# Patient Record
Sex: Female | Born: 1940
Health system: Southern US, Community
[De-identification: ages and names within clinical notes are randomized; demographics above are authoritative.]

## PROBLEM LIST (undated history)

## (undated) DIAGNOSIS — K219 Gastro-esophageal reflux disease without esophagitis: Secondary | ICD-10-CM

## (undated) DIAGNOSIS — T7840XA Allergy, unspecified, initial encounter: Secondary | ICD-10-CM

## (undated) DIAGNOSIS — I10 Essential (primary) hypertension: Secondary | ICD-10-CM

## (undated) DIAGNOSIS — E785 Hyperlipidemia, unspecified: Secondary | ICD-10-CM

## (undated) DIAGNOSIS — M1990 Unspecified osteoarthritis, unspecified site: Secondary | ICD-10-CM

## (undated) HISTORY — PX: COLONOSCOPY: SHX174

## (undated) HISTORY — DX: Allergy, unspecified, initial encounter: T78.40XA

## (undated) HISTORY — DX: Gastro-esophageal reflux disease without esophagitis: K21.9

## (undated) HISTORY — DX: Unspecified osteoarthritis, unspecified site: M19.90

## (undated) HISTORY — DX: Essential (primary) hypertension: I10

## (undated) HISTORY — PX: EYE SURGERY: SHX253

## (undated) HISTORY — DX: Hyperlipidemia, unspecified: E78.5

## (undated) HISTORY — PX: ABDOMINAL HYSTERECTOMY: SHX81

---

## 1999-10-05 ENCOUNTER — Other Ambulatory Visit: Admission: RE | Admit: 1999-10-05 | Discharge: 1999-10-05 | Payer: Self-pay | Admitting: Family Medicine

## 2001-06-23 ENCOUNTER — Other Ambulatory Visit: Admission: RE | Admit: 2001-06-23 | Discharge: 2001-06-23 | Payer: Self-pay | Admitting: Family Medicine

## 2005-10-29 ENCOUNTER — Other Ambulatory Visit: Admission: RE | Admit: 2005-10-29 | Discharge: 2005-10-29 | Payer: Self-pay | Admitting: Family Medicine

## 2012-03-30 DIAGNOSIS — Z961 Presence of intraocular lens: Secondary | ICD-10-CM | POA: Insufficient documentation

## 2012-03-30 DIAGNOSIS — I10 Essential (primary) hypertension: Secondary | ICD-10-CM | POA: Insufficient documentation

## 2014-03-14 DIAGNOSIS — I10 Essential (primary) hypertension: Secondary | ICD-10-CM | POA: Diagnosis not present

## 2014-03-14 DIAGNOSIS — Z1331 Encounter for screening for depression: Secondary | ICD-10-CM | POA: Diagnosis not present

## 2014-03-14 DIAGNOSIS — M1710 Unilateral primary osteoarthritis, unspecified knee: Secondary | ICD-10-CM | POA: Diagnosis not present

## 2014-03-14 DIAGNOSIS — E785 Hyperlipidemia, unspecified: Secondary | ICD-10-CM | POA: Diagnosis not present

## 2014-03-14 DIAGNOSIS — M5137 Other intervertebral disc degeneration, lumbosacral region: Secondary | ICD-10-CM | POA: Diagnosis not present

## 2014-03-14 DIAGNOSIS — E559 Vitamin D deficiency, unspecified: Secondary | ICD-10-CM | POA: Diagnosis not present

## 2014-03-14 DIAGNOSIS — K219 Gastro-esophageal reflux disease without esophagitis: Secondary | ICD-10-CM | POA: Diagnosis not present

## 2015-01-04 ENCOUNTER — Telehealth: Payer: Self-pay | Admitting: Family Medicine

## 2015-01-04 NOTE — Telephone Encounter (Signed)
Patient has [place]. She is currently on simvastatin, valsartan, pantaprazole, and cetrizine. Appointment given for 7/5 @ 1:10 with [facility].

## 2015-02-14 ENCOUNTER — Encounter (INDEPENDENT_AMBULATORY_CARE_PROVIDER_SITE_OTHER): Payer: Self-pay

## 2015-02-14 ENCOUNTER — Encounter: Payer: Self-pay | Admitting: Family Medicine

## 2015-02-14 ENCOUNTER — Ambulatory Visit (INDEPENDENT_AMBULATORY_CARE_PROVIDER_SITE_OTHER): Payer: Medicare Other | Admitting: Family Medicine

## 2015-02-14 VITALS — BP 141/77 | HR 79 | Temp 97.7°F | Ht 64.0 in | Wt 176.2 lb

## 2015-02-14 DIAGNOSIS — R0789 Other chest pain: Secondary | ICD-10-CM | POA: Diagnosis not present

## 2015-02-14 DIAGNOSIS — R5383 Other fatigue: Secondary | ICD-10-CM

## 2015-02-14 DIAGNOSIS — R0600 Dyspnea, unspecified: Secondary | ICD-10-CM | POA: Diagnosis not present

## 2015-02-14 DIAGNOSIS — K5900 Constipation, unspecified: Secondary | ICD-10-CM | POA: Diagnosis not present

## 2015-02-14 DIAGNOSIS — E785 Hyperlipidemia, unspecified: Secondary | ICD-10-CM | POA: Insufficient documentation

## 2015-02-14 DIAGNOSIS — I10 Essential (primary) hypertension: Secondary | ICD-10-CM | POA: Insufficient documentation

## 2015-02-14 LAB — POCT CBC
Granulocyte percent: 64.5 %G (ref 37–80)
HCT, POC: 39.5 % (ref 37.7–47.9)
HEMOGLOBIN: 12.3 g/dL (ref 12.2–16.2)
Lymph, poc: 2.5 (ref 0.6–3.4)
MCH, POC: 28.3 pg (ref 27–31.2)
MCHC: 31.2 g/dL — AB (ref 31.8–35.4)
MCV: 90.8 fL (ref 80–97)
MPV: 7.8 fL (ref 0–99.8)
POC Granulocyte: 5.7 (ref 2–6.9)
POC LYMPH %: 28.5 % (ref 10–50)
Platelet Count, POC: 272 10*3/uL (ref 142–424)
RBC: 4.35 M/uL (ref 4.04–5.48)
RDW, POC: 13.4 %
WBC: 8.8 10*3/uL (ref 4.6–10.2)

## 2015-02-14 LAB — PULMONARY FUNCTION TEST

## 2015-02-14 MED ORDER — PANTOPRAZOLE SODIUM 40 MG PO TBEC: 20.0000 mg | DELAYED_RELEASE_TABLET | Freq: Every day | ORAL | Status: DC

## 2015-02-14 NOTE — Progress Notes (Signed)
Subjective:  Patient ID: Wendy Barajas, female    DOB: June 08, 1941  Age: 74 y.o. MRN: [id number]  CC: Establish Care; Hyperlipidemia; Hypertension; Gastrophageal Reflux; and chronic constipation   HPI Lamiyah Viar presents for  follow-up of hypertension. Patient has no history of headache or recent cough. Patient also denies symptoms of TIA such as numbness weakness lateralizing. She has experienced for several months now a fairly consistent substernal pressure. This is nonexertional. It is low-grade. Pain is approximately 3/10. It is associated with some shortness of breath and trouble taking a deep breath. She is not easily winded. She is able to complete her normal activities of daily living in spite of the symptom. Patient does not check  blood pressure at home. Patient denies side effects from her medication. States taking it regularly.  Patient also  in for follow-up of elevated cholesterol. Doing well without complaints on current medication. Denies side effects of statin including myalgia and arthralgia and nausea. Also in today for liver function testing. Currently no chest pain, shortness of breath or other cardiovascular related symptoms noted. Patient in for follow-up of GERD. Currently asymptomatic taking  PPI daily. There is no heartburn. No hematemesis and no melena. No dysphagia or choking. Onset is remote. Progression is stable. Complicating factors, none.  Constipation uses stool softener every night. Ongoing long-term. She had been on a medication for the leg pain she experiences but that medicine was discontinued and the constipation did not get better as she had hoped. Additionally the leg pain has been better without the medicine so she does not want to take that currently. She is not sure the name of the medicine at this time.     History Nasya has a past medical history of Allergy; Hyperlipidemia; Hypertension; and GERD (gastroesophageal reflux disease).   She has  past surgical history that includes Abdominal hysterectomy.   Her family history includes Cancer in her brother and father; Stroke in her brother.She reports that she has never smoked. She does not have any smokeless tobacco history on file. She reports that she does not drink alcohol or use illicit drugs.  Medications are as noted under assessment and plan  ROS Review of Systems  Constitutional: Negative for fever, chills, diaphoresis, appetite change, fatigue and unexpected weight change.  HENT: Negative for congestion, ear pain, hearing loss, postnasal drip, rhinorrhea, sneezing, sore throat and trouble swallowing.   Eyes: Negative for pain.  Respiratory: Negative for cough, chest tightness and shortness of breath.   Cardiovascular: Negative for chest pain and palpitations.  Gastrointestinal: Positive for constipation. Negative for nausea, vomiting, abdominal pain and diarrhea.  Genitourinary: Negative for dysuria, frequency and menstrual problem.  Musculoskeletal: Negative for joint swelling and arthralgias.  Skin: Negative for rash.  Allergic/Immunologic: Positive for environmental allergies. Negative for immunocompromised state.  Neurological: Positive for numbness (left thigh laterally above knee. Painful too. Off med for 2 months.). Negative for dizziness, weakness and headaches.  Psychiatric/Behavioral: Negative for dysphoric mood and agitation.    Objective:  BP 141/77 mmHg  Pulse 79  Temp(Src) 97.7 F (36.5 C) (Oral)  Ht 5\' 4"  (1.626 m)  Wt 176 lb 3.2 oz (79.924 kg)  BMI 30.23 kg/m2  BP Readings from Last 3 Encounters:  02/14/15 141/77    Wt Readings from Last 3 Encounters:  02/14/15 176 lb 3.2 oz (79.924 kg)     Physical Exam  Constitutional: She is oriented to person, place, and time. She appears well-developed and well-nourished. No distress.  HENT:  Head: Normocephalic and atraumatic.  Right Ear: External ear normal.  Left Ear: External ear normal.  Nose:  Nose normal.  Mouth/Throat: Oropharynx is clear and moist.  Eyes: Conjunctivae and EOM are normal. Pupils are equal, round, and reactive to light.  Neck: Normal range of motion. Neck supple. No JVD present. No thyromegaly present.  Carotids free of bruit  Cardiovascular: Normal rate, regular rhythm and normal heart sounds.   No murmur heard. Pulmonary/Chest: Effort normal and breath sounds normal. No stridor. No respiratory distress. She has no wheezes. She has no rales. She exhibits no tenderness.  Abdominal: Soft. Bowel sounds are normal. She exhibits no distension and no mass. There is no tenderness. There is no rebound and no guarding.  Musculoskeletal: Normal range of motion. She exhibits no edema or tenderness.  Lymphadenopathy:    She has no cervical adenopathy.  Neurological: She is alert and oriented to person, place, and time. She has normal reflexes. No cranial nerve deficit. Coordination normal.  Skin: Skin is warm and dry. No rash noted. No erythema. No pallor.  Psychiatric: She has a normal mood and affect. Her behavior is normal. Judgment and thought content normal.    No results found for: HGBA1C  No results found for: WBC, HGB, HCT, PLT, GLUCOSE, CHOL, TRIG, HDL, LDLDIRECT, LDLCALC, ALT, AST, NA, K, CL, CREATININE, BUN, CO2, TSH, PSA, INR, GLUF, HGBA1C, MICROALBUR  No results found.  Assessment & Plan:   Khadeeja was seen today for establish care, hyperlipidemia, hypertension, gastrophageal reflux and chronic constipation.  Diagnoses and all orders for this visit:  Hyperlipidemia Orders: -     CMP14+EGFR -     Lipid panel  Essential hypertension, benign Orders: -     CMP14+EGFR -     Lipid panel  Other fatigue Orders: -     POCT CBC -     Vit D  25 hydroxy (rtn osteoporosis monitoring) -     Thyroid Panel With TSH  Dyspnea Orders: -     PR BREATHING CAPACITY TEST -     ECHO STRESS TEST; Future  Chest pressure Orders: -     ECHO STRESS TEST;  Future  Constipation, unspecified constipation type  Other orders -     pantoprazole (PROTONIX) 40 MG tablet; Take 1 tablet (40 mg total) by mouth daily.   I have changed Ms. Sallas's pantoprazole. I am also having her maintain her simvastatin, valsartan-hydrochlorothiazide, cetirizine, vitamin B-12, and Vitamin D3.  Meds ordered this encounter  Medications  . simvastatin (ZOCOR) 20 MG tablet    Sig: Take 20 mg by mouth daily.   . valsartan-hydrochlorothiazide (DIOVAN-HCT) 160-12.5 MG per tablet    Sig: Take 1 tablet by mouth daily.   Marland Kitchen DISCONTD: pantoprazole (PROTONIX) 40 MG tablet    Sig: Take 20 mg by mouth.  . cetirizine (ZYRTEC) 10 MG tablet    Sig: Take 10 mg by mouth daily.   . vitamin B-12 (CYANOCOBALAMIN) 1000 MCG tablet    Sig: Take 1,000 mcg by mouth daily.   . Cholecalciferol (VITAMIN D3) 400 UNITS CAPS    Sig: Take 1 capsule by mouth daily.   . pantoprazole (PROTONIX) 40 MG tablet    Sig: Take 1 tablet (40 mg total) by mouth daily.    Dispense:  90 tablet    Refill:  3   DASH and Calorie counting handouts given. Constipation handout given as well. I recommended she start Metamucil 2 tablespoons twice a day along with Colace 100  mg twice a day. She can supplement that with GlycoLax. Follow-up: Return in about 6 months (around 08/17/2015) for hypertension.  Claretta Fraise, M.D.

## 2015-02-14 NOTE — Patient Instructions (Addendum)
Calorie Counting for Weight Loss Calories are energy you get from the things you eat and drink. Your body uses this energy to keep you going throughout the day. The number of calories you eat affects your weight. When you eat more calories than your body needs, your body stores the extra calories as fat. When you eat fewer calories than your body needs, your body burns fat to get the energy it needs. Calorie counting means keeping track of how many calories you eat and drink each day. If you make sure to eat fewer calories than your body needs, you should lose weight. In order for calorie counting to work, you will need to eat the number of calories that are right for you in a day to lose a healthy amount of weight per week. A healthy amount of weight to lose per week is usually 1-2 lb (0.5-0.9 kg). A dietitian can determine how many calories you need in a day and give you suggestions on how to reach your calorie goal.  WHAT IS MY MY PLAN? My goal is to have 1200 calories per day.  If I have this many calories per day, I should lose around _____1___ pounds per week. WHAT DO I NEED TO KNOW ABOUT CALORIE COUNTING? In order to meet your daily calorie goal, you will need to:  Find out how many calories are in each food you would like to eat. Try to do this before you eat.  Decide how much of the food you can eat.  Write down what you ate and how many calories it had. Doing this is called keeping a food log. WHERE DO I FIND CALORIE INFORMATION? The number of calories in a food can be found on a Nutrition Facts label. Note that all the information on a label is based on a specific serving of the food. If a food does not have a Nutrition Facts label, try to look up the calories online or ask your dietitian for help. HOW DO I DECIDE HOW MUCH TO EAT? To decide how much of the food you can eat, you will need to consider both the number of calories in one serving and the size of one serving. This information  can be found on the Nutrition Facts label. If a food does not have a Nutrition Facts label, look up the information online or ask your dietitian for help. Remember that calories are listed per serving. If you choose to have more than one serving of a food, you will have to multiply the calories per serving by the amount of servings you plan to eat. For example, the label on a package of bread might say that a serving size is 1 slice and that there are 90 calories in a serving. If you eat 1 slice, you will have eaten 90 calories. If you eat 2 slices, you will have eaten 180 calories. HOW DO I KEEP A FOOD LOG? After each meal, record the following information in your food log:  What you ate.  How much of it you ate.  How many calories it had.  Then, add up your calories. Keep your food log near you, such as in a small notebook in your pocket. Another option is to use a mobile app or website. Some programs will calculate calories for you and show you how many calories you have left each time you add an item to the log. WHAT ARE SOME CALORIE COUNTING TIPS?  Use your calories on foods  and drinks that will fill you up and not leave you hungry. Some examples of this include foods like nuts and nut butters, vegetables, lean proteins, and high-fiber foods (more than 5 g fiber per serving).  Eat nutritious foods and avoid empty calories. Empty calories are calories you get from foods or beverages that do not have many nutrients, such as candy and soda. It is better to have a nutritious high-calorie food (such as an avocado) than a food with few nutrients (such as a bag of chips).  Know how many calories are in the foods you eat most often. This way, you do not have to look up how many calories they have each time you eat them.  Look out for foods that may seem like low-calorie foods but are really high-calorie foods, such as baked goods, soda, and fat-free candy.  Pay attention to calories in drinks.  Drinks such as sodas, specialty coffee drinks, alcohol, and juices have a lot of calories yet do not fill you up. Choose low-calorie drinks like water and diet drinks.  Focus your calorie counting efforts on higher calorie items. Logging the calories in a garden salad that contains only vegetables is less important than calculating the calories in a milk shake.  Find a way of tracking calories that works for you. Get creative. Most people who are successful find ways to keep track of how much they eat in a day, even if they do not count every calorie. WHAT ARE SOME PORTION CONTROL TIPS?  Know how many calories are in a serving. This will help you know how many servings of a certain food you can have.  Use a measuring cup to measure serving sizes. This is helpful when you start out. With time, you will be able to estimate serving sizes for some foods.  Take some time to put servings of different foods on your favorite plates, bowls, and cups so you know what a serving looks like.  Try not to eat straight from a bag or box. Doing this can lead to overeating. Put the amount you would like to eat in a cup or on a plate to make sure you are eating the right portion.  Use smaller plates, glasses, and bowls to prevent overeating. This is a quick and easy way to practice portion control. If your plate is smaller, less food can fit on it.  Try not to multitask while eating, such as watching TV or using your computer. If it is time to eat, sit down at a table and enjoy your food. Doing this will help you to start recognizing when you are full. It will also make you more aware of what and how much you are eating. HOW CAN I CALORIE COUNT WHEN EATING OUT?  Ask for smaller portion sizes or child-sized portions.  Consider sharing an entree and sides instead of getting your own entree.  If you get your own entree, eat only half. Ask for a box at the beginning of your meal and put the rest of your entree in  it so you are not tempted to eat it.  Look for the calories on the menu. If calories are listed, choose the lower calorie options.  Choose dishes that include vegetables, fruits, whole grains, low-fat dairy products, and lean protein. Focusing on smart food choices from each of the 5 food groups can help you stay on track at restaurants.  Choose items that are boiled, broiled, grilled, or steamed.  Choose  water, milk, unsweetened iced tea, or other drinks without added sugars. If you want an alcoholic beverage, choose a lower calorie option. For example, a regular margarita can have up to 700 calories and a glass of wine has around 150.  Stay away from items that are buttered, battered, fried, or served with cream sauce. Items labeled "crispy" are usually fried, unless stated otherwise.  Ask for dressings, sauces, and syrups on the side. These are usually very high in calories, so do not eat much of them.  Watch out for salads. Many people think salads are a healthy option, but this is often not the case. Many salads come with bacon, fried chicken, lots of cheese, fried chips, and dressing. All of these items have a lot of calories. If you want a salad, choose a garden salad and ask for grilled meats or steak. Ask for the dressing on the side, or ask for olive oil and vinegar or lemon to use as dressing.  Estimate how many servings of a food you are given. For example, a serving of cooked rice is  cup or about the size of half a tennis ball or one cupcake wrapper. Knowing serving sizes will help you be aware of how much food you are eating at restaurants. The list below tells you how big or small some common portion sizes are based on everyday objects.  1 oz--4 stacked dice.  3 oz--1 deck of cards.  1 tsp--1 dice.  1 Tbsp-- a Ping-Pong ball.  2 Tbsp--1 Ping-Pong ball.   cup--1 tennis ball or 1 cupcake wrapper.  1 cup--1 baseball. Document Released: 07/29/2005 Document Revised:  12/13/2013 Document Reviewed: 06/03/2013 [facility] Patient Information 2015 [place], [place]. This information is not intended to replace advice given to you by your health care provider. Make sure you discuss any questions you have with your health care provider.    DASH Eating Plan DASH stands for "Dietary Approaches to Stop Hypertension." The DASH eating plan is a healthy eating plan that has been shown to reduce high blood pressure (hypertension). Additional health benefits may include reducing the risk of type 2 diabetes mellitus, heart disease, and stroke. The DASH eating plan may also help with weight loss. WHAT DO I NEED TO KNOW ABOUT THE DASH EATING PLAN? For the DASH eating plan, you will follow these general guidelines:  Choose foods with a percent daily value for sodium of less than 5% (as listed on the food label).  Use salt-free seasonings or herbs instead of table salt or sea salt.  Check with your health care provider or pharmacist before using salt substitutes.  Eat lower-sodium products, often labeled as "lower sodium" or "no salt added."  Eat fresh foods.  Eat more vegetables, fruits, and low-fat dairy products.  Choose whole grains. Look for the word "whole" as the first word in the ingredient list.  Choose fish and skinless chicken or [place] more often than red meat. Limit fish, poultry, and meat to 6 oz (170 g) each day.  Limit sweets, desserts, sugars, and sugary drinks.  Choose heart-healthy fats.  Limit cheese to 1 oz (28 g) per day.  Eat more home-cooked food and less restaurant, buffet, and fast food.  Limit fried foods.  Cook foods using methods other than frying.  Limit canned vegetables. If you do use them, rinse them well to decrease the sodium.  When eating at a restaurant, ask that your food be prepared with less salt, or no salt if possible. WHAT  FOODS CAN I EAT? Seek help from a dietitian for individual calorie needs. Grains Whole grain or  whole wheat bread. Brown rice. Whole grain or whole wheat pasta. Quinoa, bulgur, and whole grain cereals. Low-sodium cereals. Corn or whole wheat flour tortillas. Whole grain cornbread. Whole grain crackers. Low-sodium crackers. Vegetables Fresh or frozen vegetables (raw, steamed, roasted, or grilled). Low-sodium or reduced-sodium tomato and vegetable juices. Low-sodium or reduced-sodium tomato sauce and paste. Low-sodium or reduced-sodium canned vegetables.  Fruits All fresh, canned (in natural juice), or frozen fruits. Meat and Other Protein Products Ground beef (85% or leaner), grass-fed beef, or beef trimmed of fat. Skinless chicken or [place]. Ground chicken or [place]. Pork trimmed of fat. All fish and seafood. Eggs. Dried beans, peas, or lentils. Unsalted nuts and seeds. Unsalted canned beans. Dairy Low-fat dairy products, such as skim or 1% milk, 2% or reduced-fat cheeses, low-fat ricotta or cottage cheese, or plain low-fat yogurt. Low-sodium or reduced-sodium cheeses. Fats and Oils Tub margarines without trans fats. Light or reduced-fat mayonnaise and salad dressings (reduced sodium). Avocado. Safflower, olive, or canola oils. Natural peanut or almond butter. Other Unsalted popcorn and pretzels. The items listed above may not be a complete list of recommended foods or beverages. Contact your dietitian for more options. WHAT FOODS ARE NOT RECOMMENDED? Grains White bread. White pasta. White rice. Refined cornbread. Bagels and croissants. Crackers that contain trans fat. Vegetables Creamed or fried vegetables. Vegetables in a cheese sauce. Regular canned vegetables. Regular canned tomato sauce and paste. Regular tomato and vegetable juices. Fruits Dried fruits. Canned fruit in light or heavy syrup. Fruit juice. Meat and Other Protein Products Fatty cuts of meat. Ribs, chicken wings, bacon, sausage, bologna, salami, chitterlings, fatback, hot dogs, bratwurst, and packaged luncheon meats.  Salted nuts and seeds. Canned beans with salt. Dairy Whole or 2% milk, cream, half-and-half, and cream cheese. Whole-fat or sweetened yogurt. Full-fat cheeses or blue cheese. Nondairy creamers and whipped toppings. Processed cheese, cheese spreads, or cheese curds. Condiments Onion and garlic salt, seasoned salt, table salt, and sea salt. Canned and packaged gravies. Worcestershire sauce. Tartar sauce. Barbecue sauce. Teriyaki sauce. Soy sauce, including reduced sodium. Steak sauce. Fish sauce. Oyster sauce. Cocktail sauce. Horseradish. Ketchup and mustard. Meat flavorings and tenderizers. Bouillon cubes. Hot sauce. Tabasco sauce. Marinades. Taco seasonings. Relishes. Fats and Oils Butter, stick margarine, lard, shortening, ghee, and bacon fat. Coconut, palm kernel, or palm oils. Regular salad dressings. Other Pickles and olives. Salted popcorn and pretzels. The items listed above may not be a complete list of foods and beverages to avoid. Contact your dietitian for more information. WHERE CAN I FIND MORE INFORMATION? National Heart, Lung, and Blood Institute: travelstabloid.com Document Released: 07/18/2011 Document Revised: 12/13/2013 Document Reviewed: 06/02/2013 [facility] Patient Information 2015 [place], [place]. This information is not intended to replace advice given to you by your health care provider. Make sure you discuss any questions you have with your health care provider.    About Constipation  Constipation Overview Constipation is the most common gastrointestinal complaint - about 4 million Americans experience constipation and make 2.5 million physician visits a year to get help for the problem.  Constipation can occur when the colon absorbs too much water, the colon's muscle contraction is slow or sluggish, and/or there is delayed transit time through the colon.  The result is stool that is hard and dry.  Indicators of constipation include straining  during bowel movements greater than 25% of the time, having fewer than three bowel movements  per week, and/or the feeling of incomplete evacuation.  There are established guidelines ([place] II ) for defining constipation. A person needs to have two or more of the following symptoms for at least 12 weeks (not necessarily consecutive) in the preceding 12 months: . Straining in  greater than 25% of bowel movements . Lumpy or hard stools in greater than 25% of bowel movements . Sensation of incomplete emptying in greater than 25% of bowel movements . Sensation of anorectal obstruction/blockade in greater than 25% of bowel movements . Manual maneuvers to help empty greater than 25% of bowel movements (e.g., digital evacuation, support of the pelvic floor)  . Less than  3 bowel movements/week . Loose stools are not present, and criteria for irritable bowel syndrome are insufficient  Common Causes of Constipation . Lack of fiber in your diet . Lack of physical activity . Medications, including iron and calcium supplements  . Dairy intake . Dehydration . Abuse of laxatives  Travel  Irritable Bowel Syndrome  Pregnancy  Luteal phase of menstruation (after ovulation and before menses)  Colorectal problems  Intestinal Dysfunction  Treating Constipation  There are several ways of treating constipation, including changes to diet and exercise, use of laxatives, adjustments to the pelvic floor, and scheduled toileting.  These treatments include: . increasing fiber and fluids in the diet  . increasing physical activity . learning muscle coordination   learning proper toileting techniques and toileting modifications   designing and sticking  to a toileting schedule   *Use Metamucil or similar fiber 2 tablespoons twice daily for maintenance every day. Use Colace or similar stool softener 100 mg twice daily. If constipation persists add over-the-counter GlycoLax as an as needed laxative.  2007,  [employer] Doc.22

## 2015-02-14 NOTE — Progress Notes (Deleted)
   Subjective:  Patient ID: [name], female    DOB: 1940-11-19  Age: 74 y.o. MRN: [id number]  CC: No chief complaint on file.   HPI Desere Tarabocchia presents for ***  History Agustina has no past medical history on file.   She has no past surgical history on file.   Her family history is not on file.She reports that she has never smoked. She does not have any smokeless tobacco history on file. She reports that she does not drink alcohol or use illicit drugs.  No outpatient prescriptions prior to visit.   No facility-administered medications prior to visit.    ROS Review of Systems  Objective:  BP 141/77 mmHg  Pulse 79  Temp(Src) 97.7 F (36.5 C) (Oral)  Ht 5\' 4"  (1.626 m)  Wt 176 lb 3.2 oz (79.924 kg)  BMI 30.23 kg/m2  BP Readings from Last 3 Encounters:  02/14/15 141/77    Wt Readings from Last 3 Encounters:  02/14/15 176 lb 3.2 oz (79.924 kg)     Physical Exam  No results found for: HGBA1C  No results found for: WBC, HGB, HCT, PLT, GLUCOSE, CHOL, TRIG, HDL, LDLDIRECT, LDLCALC, ALT, AST, NA, K, CL, CREATININE, BUN, CO2, TSH, PSA, INR, GLUF, HGBA1C, MICROALBUR  No results found.  Assessment & Plan:   There are no diagnoses linked to this encounter. I am having Ms. Kissoon maintain her simvastatin, valsartan-hydrochlorothiazide, pantoprazole, cetirizine, vitamin B-12, and Vitamin D3.  Meds ordered this encounter  Medications  . simvastatin (ZOCOR) 20 MG tablet    Sig: Take 20 mg by mouth daily.   . valsartan-hydrochlorothiazide (DIOVAN-HCT) 160-12.5 MG per tablet    Sig: Take 1 tablet by mouth daily.   . pantoprazole (PROTONIX) 40 MG tablet    Sig: Take 20 mg by mouth.  . cetirizine (ZYRTEC) 10 MG tablet    Sig: Take 10 mg by mouth daily.   . vitamin B-12 (CYANOCOBALAMIN) 1000 MCG tablet    Sig: Take 1,000 mcg by mouth daily.   . Cholecalciferol (VITAMIN D3) 400 UNITS CAPS    Sig: Take 1 capsule by mouth daily.      Follow-up: No Follow-up on  file.  Claretta Fraise, M.D.

## 2015-02-15 LAB — CMP14+EGFR
ALT: 12 IU/L (ref 0–32)
AST: 16 IU/L (ref 0–40)
Albumin/Globulin Ratio: 1.7 (ref 1.1–2.5)
Albumin: 4.8 g/dL (ref 3.5–4.8)
Alkaline Phosphatase: 89 IU/L (ref 39–117)
BUN/Creatinine Ratio: 17 (ref 11–26)
BUN: 22 mg/dL (ref 8–27)
Bilirubin Total: 1.2 mg/dL (ref 0.0–1.2)
CO2: 26 mmol/L (ref 18–29)
Calcium: 9.9 mg/dL (ref 8.7–10.3)
Chloride: 100 mmol/L (ref 97–108)
Creatinine, Ser: 1.3 mg/dL — ABNORMAL HIGH (ref 0.57–1.00)
GFR calc Af Amer: 47 mL/min/{1.73_m2} — ABNORMAL LOW (ref >59)
GFR calc non Af Amer: 41 mL/min/{1.73_m2} — ABNORMAL LOW (ref >59)
Globulin, Total: 2.8 g/dL (ref 1.5–4.5)
Glucose: 91 mg/dL (ref 65–99)
Potassium: 4.8 mmol/L (ref 3.5–5.2)
SODIUM: 142 mmol/L (ref 134–144)
TOTAL PROTEIN: 7.6 g/dL (ref 6.0–8.5)

## 2015-02-15 LAB — VITAMIN D 25 HYDROXY (VIT D DEFICIENCY, FRACTURES): Vit D, 25-Hydroxy: 36.5 ng/mL (ref 30.0–100.0)

## 2015-02-15 LAB — THYROID PANEL WITH TSH
FREE THYROXINE INDEX: 2 (ref 1.2–4.9)
T3 UPTAKE RATIO: 22 % — AB (ref 24–39)
T4 TOTAL: 9.2 ug/dL (ref 4.5–12.0)
TSH: 1.73 u[IU]/mL (ref 0.450–4.500)

## 2015-02-15 LAB — LIPID PANEL
CHOL/HDL RATIO: 3.7 ratio (ref 0.0–4.4)
Cholesterol, Total: 195 mg/dL (ref 100–199)
HDL: 53 mg/dL (ref >39)
LDL Calculated: 117 mg/dL — ABNORMAL HIGH (ref 0–99)
TRIGLYCERIDES: 123 mg/dL (ref 0–149)
VLDL Cholesterol Cal: 25 mg/dL (ref 5–40)

## 2015-02-24 ENCOUNTER — Encounter: Payer: Self-pay | Admitting: Family Medicine

## 2015-03-07 ENCOUNTER — Telehealth: Payer: Self-pay | Admitting: Family Medicine

## 2015-03-07 ENCOUNTER — Other Ambulatory Visit: Payer: Self-pay | Admitting: *Deleted

## 2015-03-07 MED ORDER — VALSARTAN-HYDROCHLOROTHIAZIDE 160-12.5 MG PO TABS: 1.0000 | ORAL_TABLET | Freq: Every day | ORAL | Status: DC

## 2015-03-07 MED ORDER — SIMVASTATIN 20 MG PO TABS: 20.0000 mg | ORAL_TABLET | Freq: Every day | ORAL | Status: DC

## 2015-03-07 NOTE — Telephone Encounter (Signed)
Patient requesting all medication refills from mail in pharmacy. She had lab work and an office visit on July 05.

## 2015-03-07 NOTE — Telephone Encounter (Signed)
Please do 6 mos of each.

## 2015-03-29 ENCOUNTER — Encounter: Payer: Self-pay | Admitting: *Deleted

## 2015-06-20 ENCOUNTER — Ambulatory Visit (INDEPENDENT_AMBULATORY_CARE_PROVIDER_SITE_OTHER): Payer: Medicare PPO

## 2015-06-20 DIAGNOSIS — Z23 Encounter for immunization: Secondary | ICD-10-CM | POA: Diagnosis not present

## 2015-08-18 ENCOUNTER — Ambulatory Visit (INDEPENDENT_AMBULATORY_CARE_PROVIDER_SITE_OTHER): Payer: Medicare Other | Admitting: Family Medicine

## 2015-08-18 ENCOUNTER — Encounter: Payer: Self-pay | Admitting: Family Medicine

## 2015-08-18 VITALS — Ht 64.0 in | Wt 182.6 lb

## 2015-08-18 DIAGNOSIS — K219 Gastro-esophageal reflux disease without esophagitis: Secondary | ICD-10-CM | POA: Diagnosis not present

## 2015-08-18 DIAGNOSIS — E785 Hyperlipidemia, unspecified: Secondary | ICD-10-CM | POA: Diagnosis not present

## 2015-08-18 DIAGNOSIS — I10 Essential (primary) hypertension: Secondary | ICD-10-CM | POA: Diagnosis not present

## 2015-08-18 NOTE — Progress Notes (Signed)
Subjective:  Patient ID: Wendy Barajas, female    DOB: 1941/06/22  Age: 75 y.o. MRN: [id number]  CC: Hypertension; Hyperlipidemia; and Gastroesophageal Reflux   HPI Wendy Barajas presents for  follow-up of hypertension. Patient has no history of headache chest pain or shortness of breath or recent cough. Patient also denies symptoms of TIA such as numbness weakness lateralizing. Patient checks  blood pressure at home and has not had any elevated readings recently. Patient denies side effects from his medication. States taking it regularly. Patient in for follow-up of elevated cholesterol. Doing well without complaints on current medication. Denies side effects of statin including myalgia and arthralgia and nausea. Also in today for liver function testing. Currently no chest pain, shortness of breath or other cardiovascular related symptoms noted. Patient in for follow-up of GERD. Currently asymptomatic taking  PPI daily. There is no chest pain or heartburn. No hematemesis and no melena. No dysphagia or choking. Onset is remote. Progression is stable. Complicating factors, none.   History Wendy Barajas has a past medical history of Allergy; Hyperlipidemia; Hypertension; and GERD (gastroesophageal reflux disease).   She has past surgical history that includes Abdominal hysterectomy.   Her family history includes Cancer in her brother and father; Stroke in her brother.She reports that she has never smoked. She does not have any smokeless tobacco history on file. She reports that she does not drink alcohol or use illicit drugs.  Outpatient Prescriptions Prior to Visit  Medication Sig Dispense Refill  . cetirizine (ZYRTEC) 10 MG tablet Take 10 mg by mouth daily.     . Cholecalciferol (VITAMIN D3) 400 UNITS CAPS Take 1 capsule by mouth daily.     . pantoprazole (PROTONIX) 40 MG tablet Take 1 tablet (40 mg total) by mouth daily. 90 tablet 3  . simvastatin (ZOCOR) 20 MG tablet Take 1 tablet (20 mg  total) by mouth daily. 90 tablet 3  . valsartan-hydrochlorothiazide (DIOVAN-HCT) 160-12.5 MG per tablet Take 1 tablet by mouth daily. 90 tablet 3  . vitamin B-12 (CYANOCOBALAMIN) 1000 MCG tablet Take 1,000 mcg by mouth daily.      No facility-administered medications prior to visit.    ROS Review of Systems  Constitutional: Negative for fever, activity change and appetite change.  HENT: Negative for congestion, rhinorrhea and sore throat.   Eyes: Negative for visual disturbance.  Respiratory: Negative for cough and shortness of breath.   Cardiovascular: Negative for chest pain and palpitations.  Gastrointestinal: Negative for nausea, abdominal pain and diarrhea.  Genitourinary: Negative for dysuria.  Musculoskeletal: Negative for myalgias and arthralgias.    Objective:  Ht '5\' 4"'$  (1.626 m)  Wt 182 lb 9.6 oz (82.827 kg)  BMI 31.33 kg/m2  BP Readings from Last 3 Encounters:  02/14/15 141/77    Wt Readings from Last 3 Encounters:  08/18/15 182 lb 9.6 oz (82.827 kg)  02/14/15 176 lb 3.2 oz (79.924 kg)     Physical Exam  Constitutional: She is oriented to person, place, and time. She appears well-developed and well-nourished. No distress.  HENT:  Head: Normocephalic and atraumatic.  Right Ear: External ear normal.  Left Ear: External ear normal.  Nose: Nose normal.  Mouth/Throat: Oropharynx is clear and moist.  Eyes: Conjunctivae and EOM are normal. Pupils are equal, round, and reactive to light.  Neck: Normal range of motion. Neck supple. No thyromegaly present.  Cardiovascular: Normal rate, regular rhythm and normal heart sounds.   No murmur heard. Pulmonary/Chest: Effort normal and breath sounds normal. No  respiratory distress. She has no wheezes. She has no rales.  Abdominal: Soft. Bowel sounds are normal. She exhibits no distension. There is no tenderness.  Lymphadenopathy:    She has no cervical adenopathy.  Neurological: She is alert and oriented to person, place,  and time. She has normal reflexes.  Skin: Skin is warm and dry.  Psychiatric: She has a normal mood and affect. Her behavior is normal. Judgment and thought content normal.     Lab Results  Component Value Date   WBC 7.0 08/18/2015   HGB 12.3 02/14/2015   HCT 34.8 08/18/2015   PLT 287 08/18/2015   GLUCOSE 94 08/18/2015   CHOL 181 08/18/2015   TRIG 78 08/18/2015   HDL 54 08/18/2015   LDLCALC 111* 08/18/2015   ALT 14 08/18/2015   AST 21 08/18/2015   NA 139 08/18/2015   K 4.7 08/18/2015   CL 99 08/18/2015   CREATININE 1.46* 08/18/2015   BUN 31* 08/18/2015   CO2 22 08/18/2015   TSH 1.730 02/14/2015    No results found.  Assessment & Plan:   Wendy Barajas was seen today for hypertension, hyperlipidemia and gastroesophageal reflux.  Diagnoses and all orders for this visit:  Essential hypertension, benign -     CBC with Differential/Platelet -     CMP14+EGFR  Hyperlipidemia -     CMP14+EGFR -     Lipid panel  Gastroesophageal reflux disease without esophagitis -     CBC with Differential/Platelet   I have discontinued Wendy Barajas's vitamin B-12. I am also having her maintain her cetirizine, Vitamin D3, pantoprazole, simvastatin, and valsartan-hydrochlorothiazide.  No orders of the defined types were placed in this encounter.     Follow-up: Return in about 6 months (around 02/15/2016) for CPE.  Wendy Barajas, M.D.

## 2015-08-19 LAB — CMP14+EGFR
A/G RATIO: 1.6 (ref 1.1–2.5)
ALBUMIN: 4.5 g/dL (ref 3.5–4.8)
ALT: 14 IU/L (ref 0–32)
AST: 21 IU/L (ref 0–40)
Alkaline Phosphatase: 96 IU/L (ref 39–117)
BUN / CREAT RATIO: 21 (ref 11–26)
BUN: 31 mg/dL — ABNORMAL HIGH (ref 8–27)
Bilirubin Total: 0.9 mg/dL (ref 0.0–1.2)
CALCIUM: 9.6 mg/dL (ref 8.7–10.3)
CO2: 22 mmol/L (ref 18–29)
Chloride: 99 mmol/L (ref 96–106)
Creatinine, Ser: 1.46 mg/dL — ABNORMAL HIGH (ref 0.57–1.00)
GFR, EST AFRICAN AMERICAN: 40 mL/min/{1.73_m2} — AB (ref >59)
GFR, EST NON AFRICAN AMERICAN: 35 mL/min/{1.73_m2} — AB (ref >59)
Globulin, Total: 2.9 g/dL (ref 1.5–4.5)
Glucose: 94 mg/dL (ref 65–99)
POTASSIUM: 4.7 mmol/L (ref 3.5–5.2)
Sodium: 139 mmol/L (ref 134–144)
TOTAL PROTEIN: 7.4 g/dL (ref 6.0–8.5)

## 2015-08-19 LAB — CBC WITH DIFFERENTIAL/PLATELET
BASOS: 0 %
Basophils Absolute: 0 10*3/uL (ref 0.0–0.2)
EOS (ABSOLUTE): 0.3 10*3/uL (ref 0.0–0.4)
EOS: 4 %
HEMATOCRIT: 34.8 % (ref 34.0–46.6)
HEMOGLOBIN: 11.9 g/dL (ref 11.1–15.9)
IMMATURE GRANULOCYTES: 0 %
Immature Grans (Abs): 0 10*3/uL (ref 0.0–0.1)
Lymphocytes Absolute: 2.4 10*3/uL (ref 0.7–3.1)
Lymphs: 34 %
MCH: 30.6 pg (ref 26.6–33.0)
MCHC: 34.2 g/dL (ref 31.5–35.7)
MCV: 90 fL (ref 79–97)
Monocytes Absolute: 0.5 10*3/uL (ref 0.1–0.9)
Monocytes: 7 %
NEUTROS ABS: 3.8 10*3/uL (ref 1.4–7.0)
NEUTROS PCT: 55 %
Platelets: 287 10*3/uL (ref 150–379)
RBC: 3.89 x10E6/uL (ref 3.77–5.28)
RDW: 13.6 % (ref 12.3–15.4)
WBC: 7 10*3/uL (ref 3.4–10.8)

## 2015-08-19 LAB — LIPID PANEL
CHOLESTEROL TOTAL: 181 mg/dL (ref 100–199)
Chol/HDL Ratio: 3.4 ratio units (ref 0.0–4.4)
HDL: 54 mg/dL (ref >39)
LDL Calculated: 111 mg/dL — ABNORMAL HIGH (ref 0–99)
Triglycerides: 78 mg/dL (ref 0–149)
VLDL CHOLESTEROL CAL: 16 mg/dL (ref 5–40)

## 2015-12-13 ENCOUNTER — Other Ambulatory Visit: Payer: Self-pay | Admitting: Family Medicine

## 2015-12-13 MED ORDER — VALSARTAN-HYDROCHLOROTHIAZIDE 160-12.5 MG PO TABS: 1.0000 | ORAL_TABLET | Freq: Every day | ORAL | Status: DC

## 2015-12-13 NOTE — Telephone Encounter (Signed)
Patient aware that rx was sent to pharmacy.

## 2015-12-14 ENCOUNTER — Other Ambulatory Visit: Payer: Self-pay | Admitting: Family Medicine

## 2016-02-08 DIAGNOSIS — J309 Allergic rhinitis, unspecified: Secondary | ICD-10-CM | POA: Diagnosis not present

## 2016-02-08 DIAGNOSIS — I10 Essential (primary) hypertension: Secondary | ICD-10-CM | POA: Diagnosis not present

## 2016-02-08 DIAGNOSIS — E785 Hyperlipidemia, unspecified: Secondary | ICD-10-CM | POA: Diagnosis not present

## 2016-02-08 DIAGNOSIS — K5900 Constipation, unspecified: Secondary | ICD-10-CM | POA: Diagnosis not present

## 2016-02-09 DIAGNOSIS — Z0000 Encounter for general adult medical examination without abnormal findings: Secondary | ICD-10-CM | POA: Diagnosis not present

## 2016-02-09 DIAGNOSIS — E785 Hyperlipidemia, unspecified: Secondary | ICD-10-CM | POA: Diagnosis not present

## 2016-02-09 DIAGNOSIS — I10 Essential (primary) hypertension: Secondary | ICD-10-CM | POA: Diagnosis not present

## 2016-04-16 ENCOUNTER — Other Ambulatory Visit: Payer: Self-pay | Admitting: Family Medicine

## 2016-04-16 NOTE — Telephone Encounter (Signed)
Authorize 30 days only. Then contact the patient letting them know that they will need an appointment before any further prescriptions can be sent in. 

## 2016-06-14 ENCOUNTER — Ambulatory Visit: Payer: Medicare Other | Admitting: Family Medicine

## 2016-06-19 ENCOUNTER — Other Ambulatory Visit: Payer: Self-pay | Admitting: Family Medicine

## 2016-06-25 ENCOUNTER — Ambulatory Visit: Payer: Medicare Other | Admitting: Physician Assistant

## 2016-08-07 ENCOUNTER — Other Ambulatory Visit: Payer: Medicare Other

## 2016-08-07 DIAGNOSIS — E785 Hyperlipidemia, unspecified: Secondary | ICD-10-CM

## 2016-08-07 DIAGNOSIS — I10 Essential (primary) hypertension: Secondary | ICD-10-CM

## 2016-08-08 ENCOUNTER — Ambulatory Visit (INDEPENDENT_AMBULATORY_CARE_PROVIDER_SITE_OTHER): Payer: Medicare Other | Admitting: Physician Assistant

## 2016-08-08 ENCOUNTER — Encounter (INDEPENDENT_AMBULATORY_CARE_PROVIDER_SITE_OTHER): Payer: Self-pay

## 2016-08-08 ENCOUNTER — Encounter: Payer: Self-pay | Admitting: Physician Assistant

## 2016-08-08 VITALS — BP 135/80 | HR 79 | Temp 97.0°F | Ht 64.0 in | Wt 179.0 lb

## 2016-08-08 DIAGNOSIS — K219 Gastro-esophageal reflux disease without esophagitis: Secondary | ICD-10-CM

## 2016-08-08 DIAGNOSIS — E782 Mixed hyperlipidemia: Secondary | ICD-10-CM | POA: Diagnosis not present

## 2016-08-08 DIAGNOSIS — I10 Essential (primary) hypertension: Secondary | ICD-10-CM

## 2016-08-08 DIAGNOSIS — J0100 Acute maxillary sinusitis, unspecified: Secondary | ICD-10-CM | POA: Diagnosis not present

## 2016-08-08 DIAGNOSIS — Z0000 Encounter for general adult medical examination without abnormal findings: Secondary | ICD-10-CM

## 2016-08-08 LAB — LIPID PANEL
CHOL/HDL RATIO: 3.2 ratio (ref 0.0–4.4)
Cholesterol, Total: 139 mg/dL (ref 100–199)
HDL: 43 mg/dL (ref >39)
LDL Calculated: 76 mg/dL (ref 0–99)
Triglycerides: 98 mg/dL (ref 0–149)
VLDL Cholesterol Cal: 20 mg/dL (ref 5–40)

## 2016-08-08 LAB — CBC WITH DIFFERENTIAL/PLATELET
BASOS: 0 %
Basophils Absolute: 0 10*3/uL (ref 0.0–0.2)
EOS (ABSOLUTE): 0.1 10*3/uL (ref 0.0–0.4)
EOS: 1 %
HEMATOCRIT: 34.3 % (ref 34.0–46.6)
Hemoglobin: 11.4 g/dL (ref 11.1–15.9)
IMMATURE GRANULOCYTES: 0 %
Immature Grans (Abs): 0 10*3/uL (ref 0.0–0.1)
LYMPHS ABS: 2 10*3/uL (ref 0.7–3.1)
Lymphs: 28 %
MCH: 30.4 pg (ref 26.6–33.0)
MCHC: 33.2 g/dL (ref 31.5–35.7)
MCV: 92 fL (ref 79–97)
MONOS ABS: 0.7 10*3/uL (ref 0.1–0.9)
Monocytes: 10 %
NEUTROS ABS: 4.3 10*3/uL (ref 1.4–7.0)
Neutrophils: 61 %
Platelets: 224 10*3/uL (ref 150–379)
RBC: 3.75 x10E6/uL — ABNORMAL LOW (ref 3.77–5.28)
RDW: 14.5 % (ref 12.3–15.4)
WBC: 7 10*3/uL (ref 3.4–10.8)

## 2016-08-08 LAB — CMP14+EGFR
ALT: 7 IU/L (ref 0–32)
AST: 12 IU/L (ref 0–40)
Albumin/Globulin Ratio: 1.3 (ref 1.2–2.2)
Albumin: 4.1 g/dL (ref 3.5–4.8)
Alkaline Phosphatase: 69 IU/L (ref 39–117)
BILIRUBIN TOTAL: 0.6 mg/dL (ref 0.0–1.2)
BUN/Creatinine Ratio: 12 (ref 12–28)
BUN: 18 mg/dL (ref 8–27)
CHLORIDE: 105 mmol/L (ref 96–106)
CO2: 22 mmol/L (ref 18–29)
Calcium: 9.1 mg/dL (ref 8.7–10.3)
Creatinine, Ser: 1.52 mg/dL — ABNORMAL HIGH (ref 0.57–1.00)
GFR calc non Af Amer: 33 mL/min/{1.73_m2} — ABNORMAL LOW (ref >59)
GFR, EST AFRICAN AMERICAN: 38 mL/min/{1.73_m2} — AB (ref >59)
GLUCOSE: 110 mg/dL — AB (ref 65–99)
Globulin, Total: 3.1 g/dL (ref 1.5–4.5)
Potassium: 4.5 mmol/L (ref 3.5–5.2)
Sodium: 144 mmol/L (ref 134–144)
TOTAL PROTEIN: 7.2 g/dL (ref 6.0–8.5)

## 2016-08-08 MED ORDER — SIMVASTATIN 20 MG PO TABS: 20.0000 mg | ORAL_TABLET | Freq: Every day | ORAL | 3 refills | Status: DC

## 2016-08-08 MED ORDER — PANTOPRAZOLE SODIUM 40 MG PO TBEC: 40.0000 mg | DELAYED_RELEASE_TABLET | Freq: Every day | ORAL | 3 refills | Status: DC

## 2016-08-08 MED ORDER — CETIRIZINE HCL 10 MG PO TABS: 10.0000 mg | ORAL_TABLET | Freq: Every day | ORAL | 3 refills | Status: DC

## 2016-08-08 MED ORDER — VALSARTAN-HYDROCHLOROTHIAZIDE 160-12.5 MG PO TABS: 1.0000 | ORAL_TABLET | Freq: Every day | ORAL | 3 refills | Status: DC

## 2016-08-08 MED ORDER — AMOXICILLIN 500 MG PO CAPS: 1000.0000 mg | ORAL_CAPSULE | Freq: Two times a day (BID) | ORAL | 0 refills | Status: DC

## 2016-08-08 NOTE — Patient Instructions (Signed)

## 2016-08-11 ENCOUNTER — Encounter: Payer: Self-pay | Admitting: Physician Assistant

## 2016-08-11 DIAGNOSIS — J0100 Acute maxillary sinusitis, unspecified: Secondary | ICD-10-CM | POA: Insufficient documentation

## 2016-08-11 DIAGNOSIS — Z0000 Encounter for general adult medical examination without abnormal findings: Secondary | ICD-10-CM | POA: Insufficient documentation

## 2016-08-11 DIAGNOSIS — K219 Gastro-esophageal reflux disease without esophagitis: Secondary | ICD-10-CM | POA: Insufficient documentation

## 2016-08-11 NOTE — Progress Notes (Signed)
BP 135/80   Pulse 79   Temp 97 F (36.1 C) (Oral)   Ht '5\' 4"'$  (1.626 m)   Wt 179 lb (81.2 kg)   BMI 30.73 kg/m    Subjective:    Patient ID: Wendy Barajas, female    DOB: 20-Aug-1940, 75 y.o.   MRN: [id number]  Wendy Barajas is a 75 y.o. female presenting on 08/08/2016 for Nasal Congestion (pt here today c/o sinus pressure, headache, runny nose for the past 7 days.)   HPI  Past Medical History:  Diagnosis Date  . Allergy   . GERD (gastroesophageal reflux disease)   . Hyperlipidemia   . Hypertension    Relevant past medical, surgical, family and social history reviewed and updated as indicated. Interim medical history since our last visit reviewed. Allergies and medications reviewed and updated.   Data reviewed from any sources in EPIC.  Review of Systems  Constitutional: Negative.  Negative for activity change, appetite change, fatigue and fever.  HENT: Positive for congestion, sinus pain, sinus pressure and sore throat.   Eyes: Negative.   Respiratory: Positive for cough. Negative for wheezing.   Cardiovascular: Negative.  Negative for chest pain, palpitations and leg swelling.  Gastrointestinal: Negative.  Negative for abdominal pain.  Endocrine: Negative.   Genitourinary: Negative.  Negative for dysuria.  Musculoskeletal: Negative.   Skin: Negative.   Neurological: Negative.      Social History   Social History  . Marital status: Widowed    Spouse name: N/A  . Number of children: N/A  . Years of education: N/A   Occupational History  . Not on file.   Social History Main Topics  . Smoking status: Never Smoker  . Smokeless tobacco: Never Used  . Alcohol use No  . Drug use: No  . Sexual activity: Not on file   Other Topics Concern  . Not on file   Social History Narrative  . No narrative on file    Past Surgical History:  Procedure Laterality Date  . ABDOMINAL HYSTERECTOMY      Family History  Problem Relation Age of Onset  . Cancer Father    . Cancer Brother   . Stroke Brother     Allergies as of 08/08/2016   No Known Allergies     Medication List       Accurate as of 08/08/16 11:59 PM. Always use your most recent med list.          amoxicillin 500 MG capsule Commonly known as:  AMOXIL Take 2 capsules (1,000 mg total) by mouth 2 (two) times daily.   cetirizine 10 MG tablet Commonly known as:  ZYRTEC Take 1 tablet (10 mg total) by mouth daily.   pantoprazole 40 MG tablet Commonly known as:  PROTONIX Take 1 tablet (40 mg total) by mouth daily.   simvastatin 20 MG tablet Commonly known as:  ZOCOR Take 1 tablet (20 mg total) by mouth daily.   valsartan-hydrochlorothiazide 160-12.5 MG tablet Commonly known as:  DIOVAN-HCT Take 1 tablet by mouth daily.   Vitamin D3 400 units Caps Take 1 capsule by mouth daily.          Objective:    BP 135/80   Pulse 79   Temp 97 F (36.1 C) (Oral)   Ht '5\' 4"'$  (1.626 m)   Wt 179 lb (81.2 kg)   BMI 30.73 kg/m   No Known Allergies Wt Readings from Last 3 Encounters:  08/08/16 179 lb (81.2 kg)  08/18/15 182 lb 9.6 oz (82.8 kg)  02/14/15 176 lb 3.2 oz (79.9 kg)    Physical Exam  Constitutional: She is oriented to person, place, and time. She appears well-developed and well-nourished.  HENT:  Head: Normocephalic and atraumatic.  Right Ear: Tympanic membrane, external ear and ear canal normal. No middle ear effusion.  Left Ear: Tympanic membrane, external ear and ear canal normal.  No middle ear effusion.  Nose: Mucosal edema and rhinorrhea present. Right sinus exhibits no maxillary sinus tenderness. Left sinus exhibits no maxillary sinus tenderness.  Mouth/Throat: Uvula is midline and mucous membranes are normal. Posterior oropharyngeal erythema present. No oropharyngeal exudate.  Eyes: Conjunctivae and EOM are normal. Pupils are equal, round, and reactive to light. Right eye exhibits no discharge. Left eye exhibits no discharge.  Neck: Normal range of motion.  Neck supple.  Cardiovascular: Normal rate, regular rhythm, normal heart sounds and intact distal pulses.   Pulmonary/Chest: Effort normal and breath sounds normal. No respiratory distress. She has no wheezes.  Abdominal: Soft. Bowel sounds are normal.  Lymphadenopathy:    She has no cervical adenopathy.  Neurological: She is alert and oriented to person, place, and time. She has normal reflexes.  Skin: Skin is warm and dry. No rash noted.  Psychiatric: She has a normal mood and affect. Her behavior is normal. Judgment and thought content normal.    Results for orders placed or performed in visit on 08/07/16  CBC with Differential/Platelet  Result Value Ref Range   WBC 7.0 3.4 - 10.8 x10E3/uL   RBC 3.75 (L) 3.77 - 5.28 x10E6/uL   Hemoglobin 11.4 11.1 - 15.9 g/dL   Hematocrit 34.3 34.0 - 46.6 %   MCV 92 79 - 97 fL   MCH 30.4 26.6 - 33.0 pg   MCHC 33.2 31.5 - 35.7 g/dL   RDW 14.5 12.3 - 15.4 %   Platelets 224 150 - 379 x10E3/uL   Neutrophils 61 Not Estab. %   Lymphs 28 Not Estab. %   Monocytes 10 Not Estab. %   Eos 1 Not Estab. %   Basos 0 Not Estab. %   Neutrophils Absolute 4.3 1.4 - 7.0 x10E3/uL   Lymphocytes Absolute 2.0 0.7 - 3.1 x10E3/uL   Monocytes Absolute 0.7 0.1 - 0.9 x10E3/uL   EOS (ABSOLUTE) 0.1 0.0 - 0.4 x10E3/uL   Basophils Absolute 0.0 0.0 - 0.2 x10E3/uL   Immature Granulocytes 0 Not Estab. %   Immature Grans (Abs) 0.0 0.0 - 0.1 x10E3/uL  Lipid panel  Result Value Ref Range   Cholesterol, Total 139 100 - 199 mg/dL   Triglycerides 98 0 - 149 mg/dL   HDL 43 >39 mg/dL   VLDL Cholesterol Cal 20 5 - 40 mg/dL   LDL Calculated 76 0 - 99 mg/dL   Chol/HDL Ratio 3.2 0.0 - 4.4 ratio units  CMP14+EGFR  Result Value Ref Range   Glucose 110 (H) 65 - 99 mg/dL   BUN 18 8 - 27 mg/dL   Creatinine, Ser 1.52 (H) 0.57 - 1.00 mg/dL   GFR calc non Af Amer 33 (L) >59 mL/min/1.73   GFR calc Af Amer 38 (L) >59 mL/min/1.73   BUN/Creatinine Ratio 12 12 - 28   Sodium 144 134 - 144  mmol/L   Potassium 4.5 3.5 - 5.2 mmol/L   Chloride 105 96 - 106 mmol/L   CO2 22 18 - 29 mmol/L   Calcium 9.1 8.7 - 10.3 mg/dL   Total Protein 7.2 6.0 - 8.5  g/dL   Albumin 4.1 3.5 - 4.8 g/dL   Globulin, Total 3.1 1.5 - 4.5 g/dL   Albumin/Globulin Ratio 1.3 1.2 - 2.2   Bilirubin Total 0.6 0.0 - 1.2 mg/dL   Alkaline Phosphatase 69 39 - 117 IU/L   AST 12 0 - 40 IU/L   ALT 7 0 - 32 IU/L      Assessment & Plan:   1. Well adult exam - cetirizine (ZYRTEC) 10 MG tablet; Take 1 tablet (10 mg total) by mouth daily.  Dispense: 90 tablet; Refill: 3  2. Acute non-recurrent maxillary sinusitis - amoxicillin (AMOXIL) 500 MG capsule; Take 2 capsules (1,000 mg total) by mouth 2 (two) times daily.  Dispense: 40 capsule; Refill: 0  3. Essential hypertension - valsartan-hydrochlorothiazide (DIOVAN-HCT) 160-12.5 MG tablet; Take 1 tablet by mouth daily.  Dispense: 90 tablet; Refill: 3  4. Gastroesophageal reflux disease without esophagitis - pantoprazole (PROTONIX) 40 MG tablet; Take 1 tablet (40 mg total) by mouth daily.  Dispense: 90 tablet; Refill: 3  5. Mixed hyperlipidemia - simvastatin (ZOCOR) 20 MG tablet; Take 1 tablet (20 mg total) by mouth daily.  Dispense: 90 tablet; Refill: 3   Continue all other maintenance medications as listed above. Educational handout given for health maintenance  Follow up plan: Return in about 6 months (around 02/06/2017) for recheck lab and BP.  Terald Sleeper PA-C [facility] [address]  [place], [place] [address] [phone]   08/11/2016, 6:43 PM

## 2017-02-07 ENCOUNTER — Encounter: Payer: Self-pay | Admitting: Physician Assistant

## 2017-02-07 ENCOUNTER — Ambulatory Visit (INDEPENDENT_AMBULATORY_CARE_PROVIDER_SITE_OTHER): Payer: Medicare HMO | Admitting: Physician Assistant

## 2017-02-07 VITALS — BP 120/76 | HR 84 | Temp 97.1°F | Ht 64.0 in | Wt 179.4 lb

## 2017-02-07 DIAGNOSIS — R7309 Other abnormal glucose: Secondary | ICD-10-CM

## 2017-02-07 DIAGNOSIS — K5904 Chronic idiopathic constipation: Secondary | ICD-10-CM | POA: Diagnosis not present

## 2017-02-07 DIAGNOSIS — I10 Essential (primary) hypertension: Secondary | ICD-10-CM

## 2017-02-07 DIAGNOSIS — E785 Hyperlipidemia, unspecified: Secondary | ICD-10-CM

## 2017-02-07 DIAGNOSIS — Z1211 Encounter for screening for malignant neoplasm of colon: Secondary | ICD-10-CM

## 2017-02-07 LAB — BAYER DCA HB A1C WAIVED: HB A1C (BAYER DCA - WAIVED): 6 % (ref <7.0)

## 2017-02-07 MED ORDER — MONTELUKAST SODIUM 10 MG PO TABS: 10.0000 mg | ORAL_TABLET | Freq: Every day | ORAL | 3 refills | Status: DC

## 2017-02-07 NOTE — Progress Notes (Signed)
BP 120/76   Pulse 84   Temp 97.1 F (36.2 C) (Oral)   Ht _0  (1.626 m)   Wt 179 lb 6.4 oz (81.4 kg)   BMI 30.79 kg/m    Subjective:    Patient ID: Wendy Barajas, female    DOB: [birth year], 76 y.o.   MRN: [id number]  HPI: Wendy Barajas is a 76 y.o. female presenting on 02/07/2017 for Follow-up  This patient comes in for periodic recheck on medications and conditions including increased hypertension, hyperlipidemia, chronic constipation. She states she does need a screening colonoscopy. Is been some years since she has had one. She takes a over-the-counter stool softener with good effect. She takes it daily. She does not take it she gets act up again. She's not having any other issues except allergies are slightly worse. She has taken Claritin and Zyrtec in the past. She is currently taking Zyrtec. She would like to try singulair.   All medications are reviewed today. There are no reports of any problems with the medications. All of the medical conditions are reviewed and updated.  Lab work is reviewed and will be ordered as medically necessary. There are no new problems reported with today's visit.  Relevant past medical, surgical, family and social history reviewed and updated as indicated. Allergies and medications reviewed and updated.  Past Medical History:  Diagnosis Date  . Allergy   . GERD (gastroesophageal reflux disease)   . Hyperlipidemia   . Hypertension     Past Surgical History:  Procedure Laterality Date  . ABDOMINAL HYSTERECTOMY      Review of Systems  Constitutional: Negative.  Negative for activity change, fatigue and fever.  HENT: Negative.   Eyes: Negative.   Respiratory: Negative.  Negative for cough.   Cardiovascular: Negative.  Negative for chest pain.  Gastrointestinal: Negative.  Negative for abdominal pain.  Endocrine: Negative.   Genitourinary: Negative.  Negative for dysuria.  Musculoskeletal: Negative.   Skin: Negative.   Neurological:  Negative.     Allergies as of 02/07/2017   No Known Allergies     Medication List       Accurate as of 02/07/17  9:59 AM. Always use your most recent med list.          cetirizine 10 MG tablet Commonly known as:  ZYRTEC Take 1 tablet (10 mg total) by mouth daily.   montelukast 10 MG tablet Commonly known as:  SINGULAIR Take 1 tablet (10 mg total) by mouth at bedtime.   pantoprazole 40 MG tablet Commonly known as:  PROTONIX Take 1 tablet (40 mg total) by mouth daily.   simvastatin 20 MG tablet Commonly known as:  ZOCOR Take 1 tablet (20 mg total) by mouth daily.   valsartan-hydrochlorothiazide 160-12.5 MG tablet Commonly known as:  DIOVAN-HCT Take 1 tablet by mouth daily.   Vitamin D3 400 units Caps Take 1 capsule by mouth daily.          Objective:    BP 120/76   Pulse 84   Temp 97.1 F (36.2 C) (Oral)   Ht _1  (1.626 m)   Wt 179 lb 6.4 oz (81.4 kg)   BMI 30.79 kg/m   No Known Allergies  Physical Exam  Constitutional: She is oriented to person, place, and time. She appears well-developed and well-nourished.  HENT:  Head: Normocephalic and atraumatic.  Right Ear: Tympanic membrane, external ear and ear canal normal.  Left Ear: Tympanic membrane, external ear and ear canal normal.  Nose: Nose normal. No rhinorrhea.  Mouth/Throat: Oropharynx is clear and moist and mucous membranes are normal. No oropharyngeal exudate or posterior oropharyngeal erythema.  Eyes: Conjunctivae and EOM are normal. Pupils are equal, round, and reactive to light.  Neck: Normal range of motion. Neck supple.  Cardiovascular: Normal rate, regular rhythm, normal heart sounds and intact distal pulses.   Pulmonary/Chest: Effort normal and breath sounds normal.  Abdominal: Soft. Bowel sounds are normal.  Neurological: She is alert and oriented to person, place, and time. She has normal reflexes.  Skin: Skin is warm and dry. No rash noted.  Psychiatric: She has a normal mood and  affect. Her behavior is normal. Judgment and thought content normal.  Nursing note and vitals reviewed.   Results for orders placed or performed in visit on 08/07/16  CBC with Differential/Platelet  Result Value Ref Range   WBC 7.0 3.4 - 10.8 x10E3/uL   RBC 3.75 (L) 3.77 - 5.28 x10E6/uL   Hemoglobin 11.4 11.1 - 15.9 g/dL   Hematocrit 34.3 34.0 - 46.6 %   MCV 92 79 - 97 fL   MCH 30.4 26.6 - 33.0 pg   MCHC 33.2 31.5 - 35.7 g/dL   RDW 14.5 12.3 - 15.4 %   Platelets 224 150 - 379 x10E3/uL   Neutrophils 61 Not Estab. %   Lymphs 28 Not Estab. %   Monocytes 10 Not Estab. %   Eos 1 Not Estab. %   Basos 0 Not Estab. %   Neutrophils Absolute 4.3 1.4 - 7.0 x10E3/uL   Lymphocytes Absolute 2.0 0.7 - 3.1 x10E3/uL   Monocytes Absolute 0.7 0.1 - 0.9 x10E3/uL   EOS (ABSOLUTE) 0.1 0.0 - 0.4 x10E3/uL   Basophils Absolute 0.0 0.0 - 0.2 x10E3/uL   Immature Granulocytes 0 Not Estab. %   Immature Grans (Abs) 0.0 0.0 - 0.1 x10E3/uL  Lipid panel  Result Value Ref Range   Cholesterol, Total 139 100 - 199 mg/dL   Triglycerides 98 0 - 149 mg/dL   HDL 43 >39 mg/dL   VLDL Cholesterol Cal 20 5 - 40 mg/dL   LDL Calculated 76 0 - 99 mg/dL   Chol/HDL Ratio 3.2 0.0 - 4.4 ratio units  CMP14+EGFR  Result Value Ref Range   Glucose 110 (H) 65 - 99 mg/dL   BUN 18 8 - 27 mg/dL   Creatinine, Ser 1.52 (H) 0.57 - 1.00 mg/dL   GFR calc non Af Amer 33 (L) >59 mL/min/1.73   GFR calc Af Amer 38 (L) >59 mL/min/1.73   BUN/Creatinine Ratio 12 12 - 28   Sodium 144 134 - 144 mmol/L   Potassium 4.5 3.5 - 5.2 mmol/L   Chloride 105 96 - 106 mmol/L   CO2 22 18 - 29 mmol/L   Calcium 9.1 8.7 - 10.3 mg/dL   Total Protein 7.2 6.0 - 8.5 g/dL   Albumin 4.1 3.5 - 4.8 g/dL   Globulin, Total 3.1 1.5 - 4.5 g/dL   Albumin/Globulin Ratio 1.3 1.2 - 2.2   Bilirubin Total 0.6 0.0 - 1.2 mg/dL   Alkaline Phosphatase 69 39 - 117 IU/L   AST 12 0 - 40 IU/L   ALT 7 0 - 32 IU/L      Assessment & Plan:   1. Essential hypertension -  CMP14+EGFR - Microalbumin / creatinine urine ratio - Lipid panel - Bayer DCA Hb A1c Waived  2. Hyperlipidemia, unspecified hyperlipidemia type - Lipid panel  3. Elevated glucose - Bayer DCA Hb A1c Waived  4. Chronic idiopathic constipation - Ambulatory referral to Gastroenterology  5. Screening for colon cancer - Ambulatory referral to Gastroenterology    Current Outpatient Prescriptions:  .  cetirizine (ZYRTEC) 10 MG tablet, Take 1 tablet (10 mg total) by mouth daily., Disp: 90 tablet, Rfl: 3 .  Cholecalciferol (VITAMIN D3) 400 UNITS CAPS, Take 1 capsule by mouth daily. , Disp: , Rfl:  .  pantoprazole (PROTONIX) 40 MG tablet, Take 1 tablet (40 mg total) by mouth daily., Disp: 90 tablet, Rfl: 3 .  simvastatin (ZOCOR) 20 MG tablet, Take 1 tablet (20 mg total) by mouth daily., Disp: 90 tablet, Rfl: 3 .  valsartan-hydrochlorothiazide (DIOVAN-HCT) 160-12.5 MG tablet, Take 1 tablet by mouth daily., Disp: 90 tablet, Rfl: 3 .  montelukast (SINGULAIR) 10 MG tablet, Take 1 tablet (10 mg total) by mouth at bedtime., Disp: 90 tablet, Rfl: 3  Continue all other maintenance medications as listed above.  Follow up plan: Return in about 6 months (around 08/09/2017) for recheck.  Educational handout given for  constipation  Cerritos Cellar PA-C [facility] [address]  [place], [place] [address] [phone]   02/07/2017, 9:59 AM

## 2017-02-07 NOTE — Patient Instructions (Addendum)
Constipation, Adult Constipation is when a person:  Poops (has a bowel movement) fewer times in a week than normal.  Has a hard time pooping.  Has poop that is dry, hard, or bigger than normal.  Follow these instructions at home: Eating and drinking   Eat foods that have a lot of fiber, such as: ? Fresh fruits and vegetables. ? Whole grains. ? Beans.  Eat less of foods that are high in fat, low in fiber, or overly processed, such as: ? [place] fries. ? Hamburgers. ? Cookies. ? Candy. ? Soda.  Drink enough fluid to keep your pee (urine) clear or pale yellow. General instructions  Exercise regularly or as told by your doctor.  Go to the restroom when you feel like you need to poop. Do not hold it in.  Take over-the-counter and prescription medicines only as told by your doctor. These include any fiber supplements.  Do pelvic floor retraining exercises, such as: ? Doing deep breathing while relaxing your lower belly (abdomen). ? Relaxing your pelvic floor while pooping.  Watch your condition for any changes.  Keep all follow-up visits as told by your doctor. This is important. Contact a doctor if:  You have pain that gets worse.  You have a fever.  You have not pooped for 4 days.  You throw up (vomit).  You are not hungry.  You lose weight.  You are bleeding from the anus.  You have thin, pencil-like poop (stool). Get help right away if:  You have a fever, and your symptoms suddenly get worse.  You leak poop or have blood in your poop.  Your belly feels hard or bigger than normal (is bloated).  You have very bad belly pain.  You feel dizzy or you faint. This information is not intended to replace advice given to you by your health care provider. Make sure you discuss any questions you have with your health care provider. Document Released: 01/15/2008 Document Revised: 02/16/2016 Document Reviewed: 01/17/2016 Elsevier Interactive Patient Education   2017 [employer]. 1

## 2017-02-08 LAB — CMP14+EGFR
A/G RATIO: 1.5 (ref 1.2–2.2)
ALBUMIN: 4.4 g/dL (ref 3.5–4.8)
ALT: 10 IU/L (ref 0–32)
AST: 15 IU/L (ref 0–40)
Alkaline Phosphatase: 94 IU/L (ref 39–117)
BUN/Creatinine Ratio: 13 (ref 12–28)
BUN: 20 mg/dL (ref 8–27)
Bilirubin Total: 0.8 mg/dL (ref 0.0–1.2)
CALCIUM: 9.7 mg/dL (ref 8.7–10.3)
CO2: 21 mmol/L (ref 20–29)
CREATININE: 1.59 mg/dL — AB (ref 0.57–1.00)
Chloride: 101 mmol/L (ref 96–106)
GFR, EST AFRICAN AMERICAN: 36 mL/min/{1.73_m2} — AB (ref >59)
GFR, EST NON AFRICAN AMERICAN: 31 mL/min/{1.73_m2} — AB (ref >59)
GLOBULIN, TOTAL: 3 g/dL (ref 1.5–4.5)
Glucose: 110 mg/dL — ABNORMAL HIGH (ref 65–99)
POTASSIUM: 4 mmol/L (ref 3.5–5.2)
SODIUM: 140 mmol/L (ref 134–144)
TOTAL PROTEIN: 7.4 g/dL (ref 6.0–8.5)

## 2017-02-08 LAB — LIPID PANEL
CHOL/HDL RATIO: 3.8 ratio (ref 0.0–4.4)
Cholesterol, Total: 175 mg/dL (ref 100–199)
HDL: 46 mg/dL (ref >39)
LDL CALC: 103 mg/dL — AB (ref 0–99)
TRIGLYCERIDES: 130 mg/dL (ref 0–149)
VLDL Cholesterol Cal: 26 mg/dL (ref 5–40)

## 2017-02-11 DIAGNOSIS — K5900 Constipation, unspecified: Secondary | ICD-10-CM | POA: Diagnosis not present

## 2017-02-14 LAB — MICROALBUMIN / CREATININE URINE RATIO

## 2017-02-25 ENCOUNTER — Telehealth: Payer: Self-pay | Admitting: Physician Assistant

## 2017-02-25 MED ORDER — LOSARTAN POTASSIUM-HCTZ 100-25 MG PO TABS: 1.0000 | ORAL_TABLET | Freq: Every day | ORAL | 3 refills | Status: DC

## 2017-02-25 NOTE — Telephone Encounter (Signed)
Patient aware of recommendation and advised to monitor bp also.

## 2017-02-25 NOTE — Telephone Encounter (Signed)
Due to recall on Valsartan, please review and advise

## 2017-02-27 DIAGNOSIS — K648 Other hemorrhoids: Secondary | ICD-10-CM | POA: Diagnosis not present

## 2017-02-27 DIAGNOSIS — K6389 Other specified diseases of intestine: Secondary | ICD-10-CM | POA: Diagnosis not present

## 2017-02-27 DIAGNOSIS — Z1211 Encounter for screening for malignant neoplasm of colon: Secondary | ICD-10-CM | POA: Insufficient documentation

## 2017-02-27 HISTORY — DX: Encounter for screening for malignant neoplasm of colon: Z12.11

## 2017-03-06 ENCOUNTER — Other Ambulatory Visit: Payer: Self-pay | Admitting: *Deleted

## 2017-03-06 MED ORDER — LOSARTAN POTASSIUM-HCTZ 100-25 MG PO TABS: 1.0000 | ORAL_TABLET | Freq: Every day | ORAL | 3 refills | Status: DC

## 2017-08-06 ENCOUNTER — Ambulatory Visit: Payer: Medicare HMO | Admitting: Physician Assistant

## 2017-08-06 ENCOUNTER — Encounter: Payer: Self-pay | Admitting: Physician Assistant

## 2017-08-06 DIAGNOSIS — Z0000 Encounter for general adult medical examination without abnormal findings: Secondary | ICD-10-CM

## 2017-08-06 DIAGNOSIS — K219 Gastro-esophageal reflux disease without esophagitis: Secondary | ICD-10-CM

## 2017-08-06 DIAGNOSIS — E782 Mixed hyperlipidemia: Secondary | ICD-10-CM

## 2017-08-06 MED ORDER — LORATADINE 10 MG PO TABS: 10.0000 mg | ORAL_TABLET | Freq: Every day | ORAL | 3 refills | Status: DC

## 2017-08-06 MED ORDER — PANTOPRAZOLE SODIUM 40 MG PO TBEC: 40.0000 mg | DELAYED_RELEASE_TABLET | Freq: Every day | ORAL | 3 refills | Status: DC

## 2017-08-06 MED ORDER — SIMVASTATIN 20 MG PO TABS: 20.0000 mg | ORAL_TABLET | Freq: Every day | ORAL | 3 refills | Status: DC

## 2017-08-06 NOTE — Progress Notes (Signed)
BP 132/71   Pulse 77   Temp (!) 96.7 F (35.9 C) (Oral)   Ht 5' 4"  (1.626 m)   Wt 183 lb (83 kg)   BMI 31.41 kg/m    Subjective:    Patient ID: Wendy Barajas, female    DOB: May 22, 1941, 76 y.o.   MRN: [id number]  HPI: Wendy Barajas is a 76 y.o. female presenting on 08/06/2017 for Hypertension (6 month recheck) and Hyperlipidemia  This patient comes in for periodic recheck on medications and conditions including hyperlipidemia, GERD and some allergies.  She reports overall she is doing well.  She states the Singulair she has tried did not help her allergies at all.  She is still taking all of her other medications..  She is due lab work today.  All medications are reviewed today. There are no reports of any problems with the medications. All of the medical conditions are reviewed and updated.  Lab work is reviewed and will be ordered as medically necessary. There are no new problems reported with today's visit.   Relevant past medical, surgical, family and social history reviewed and updated as indicated. Allergies and medications reviewed and updated.  Past Medical History:  Diagnosis Date  . Allergy   . GERD (gastroesophageal reflux disease)   . Hyperlipidemia   . Hypertension     Past Surgical History:  Procedure Laterality Date  . ABDOMINAL HYSTERECTOMY      Review of Systems  Constitutional: Negative.  Negative for activity change, fatigue and fever.  HENT: Negative.   Eyes: Negative.   Respiratory: Negative.  Negative for cough.   Cardiovascular: Negative.  Negative for chest pain.  Gastrointestinal: Negative.  Negative for abdominal pain.  Endocrine: Negative.   Genitourinary: Negative.  Negative for dysuria.  Musculoskeletal: Negative.   Skin: Negative.   Neurological: Negative.     Allergies as of 08/06/2017   No Known Allergies     Medication List        Accurate as of 08/06/17  9:10 AM. Always use your most recent med list.            loratadine 10 MG tablet Commonly known as:  CLARITIN Take 1 tablet (10 mg total) by mouth daily.   losartan-hydrochlorothiazide 100-25 MG tablet Commonly known as:  HYZAAR Take 1 tablet by mouth daily.   pantoprazole 40 MG tablet Commonly known as:  PROTONIX Take 1 tablet (40 mg total) by mouth daily.   simvastatin 20 MG tablet Commonly known as:  ZOCOR Take 1 tablet (20 mg total) by mouth daily.   Vitamin D3 400 units Caps Take 1 capsule by mouth daily.          Objective:    BP 132/71   Pulse 77   Temp (!) 96.7 F (35.9 C) (Oral)   Ht 5' 4"  (1.626 m)   Wt 183 lb (83 kg)   BMI 31.41 kg/m   No Known Allergies  Physical Exam  Constitutional: She is oriented to person, place, and time. She appears well-developed and well-nourished.  HENT:  Head: Normocephalic and atraumatic.  Right Ear: Tympanic membrane, external ear and ear canal normal.  Left Ear: Tympanic membrane, external ear and ear canal normal.  Nose: Nose normal. No rhinorrhea.  Mouth/Throat: Oropharynx is clear and moist and mucous membranes are normal. No oropharyngeal exudate or posterior oropharyngeal erythema.  Eyes: Conjunctivae and EOM are normal. Pupils are equal, round, and reactive to light.  Neck: Normal range of motion. Neck  supple.  Cardiovascular: Normal rate, regular rhythm, normal heart sounds and intact distal pulses.  Pulmonary/Chest: Effort normal and breath sounds normal.  Abdominal: Soft. Bowel sounds are normal.  Neurological: She is alert and oriented to person, place, and time. She has normal reflexes.  Skin: Skin is warm and dry. No rash noted.  Psychiatric: She has a normal mood and affect. Her behavior is normal. Judgment and thought content normal.  Nursing note and vitals reviewed.   Results for orders placed or performed in visit on 02/07/17  CMP14+EGFR  Result Value Ref Range   Glucose 110 (H) 65 - 99 mg/dL   BUN 20 8 - 27 mg/dL   Creatinine, Ser 1.59 (H) 0.57 - 1.00  mg/dL   GFR calc non Af Amer 31 (L) >59 mL/min/1.73   GFR calc Af Amer 36 (L) >59 mL/min/1.73   BUN/Creatinine Ratio 13 12 - 28   Sodium 140 134 - 144 mmol/L   Potassium 4.0 3.5 - 5.2 mmol/L   Chloride 101 96 - 106 mmol/L   CO2 21 20 - 29 mmol/L   Calcium 9.7 8.7 - 10.3 mg/dL   Total Protein 7.4 6.0 - 8.5 g/dL   Albumin 4.4 3.5 - 4.8 g/dL   Globulin, Total 3.0 1.5 - 4.5 g/dL   Albumin/Globulin Ratio 1.5 1.2 - 2.2   Bilirubin Total 0.8 0.0 - 1.2 mg/dL   Alkaline Phosphatase 94 39 - 117 IU/L   AST 15 0 - 40 IU/L   ALT 10 0 - 32 IU/L  Microalbumin / creatinine urine ratio  Result Value Ref Range   Creatinine, Urine CANCELED mg/dL   Albumin, Urine CANCELED   Lipid panel  Result Value Ref Range   Cholesterol, Total 175 100 - 199 mg/dL   Triglycerides 130 0 - 149 mg/dL   HDL 46 >39 mg/dL   VLDL Cholesterol Cal 26 5 - 40 mg/dL   LDL Calculated 103 (H) 0 - 99 mg/dL   Chol/HDL Ratio 3.8 0.0 - 4.4 ratio  Bayer DCA Hb A1c Waived  Result Value Ref Range   Bayer DCA Hb A1c Waived 6.0 <7.0 %      Assessment & Plan:   1. Mixed hyperlipidemia - simvastatin (ZOCOR) 20 MG tablet; Take 1 tablet (20 mg total) by mouth daily.  Dispense: 90 tablet; Refill: 3 - CBC with Differential/Platelet - CMP14+EGFR - Lipid panel  2. Gastroesophageal reflux disease without esophagitis - pantoprazole (PROTONIX) 40 MG tablet; Take 1 tablet (40 mg total) by mouth daily.  Dispense: 90 tablet; Refill: 3  3. Well adult exam - CBC with Differential/Platelet - CMP14+EGFR    Current Outpatient Medications:  .  Cholecalciferol (VITAMIN D3) 400 UNITS CAPS, Take 1 capsule by mouth daily. , Disp: , Rfl:  .  loratadine (CLARITIN) 10 MG tablet, Take 1 tablet (10 mg total) by mouth daily., Disp: 90 tablet, Rfl: 3 .  losartan-hydrochlorothiazide (HYZAAR) 100-25 MG tablet, Take 1 tablet by mouth daily., Disp: 90 tablet, Rfl: 3 .  pantoprazole (PROTONIX) 40 MG tablet, Take 1 tablet (40 mg total) by mouth daily.,  Disp: 90 tablet, Rfl: 3 .  simvastatin (ZOCOR) 20 MG tablet, Take 1 tablet (20 mg total) by mouth daily., Disp: 90 tablet, Rfl: 3 Continue all other maintenance medications as listed above.  Follow up plan: Return in about 6 months (around 02/04/2018) for recheck.  Educational handout given for [place] PA-C [place], [place]  [id number] [phone]   08/06/2017, 9:10 AM

## 2017-08-06 NOTE — Patient Instructions (Signed)
In a few days you may receive a survey in the mail or online from [employer] regarding your visit with us today. Please take a moment to fill this out. Your feedback is very important to our whole office. It can help us better understand your needs as well as improve your experience and satisfaction. Thank you for taking your time to complete it. We care about you.  Fayola Meckes, PA-C  

## 2017-08-07 LAB — CBC WITH DIFFERENTIAL/PLATELET
BASOS ABS: 0 10*3/uL (ref 0.0–0.2)
Basos: 1 %
EOS (ABSOLUTE): 0.4 10*3/uL (ref 0.0–0.4)
EOS: 6 %
Hematocrit: 34 % (ref 34.0–46.6)
Hemoglobin: 10.9 g/dL — ABNORMAL LOW (ref 11.1–15.9)
IMMATURE GRANULOCYTES: 0 %
Immature Grans (Abs): 0 10*3/uL (ref 0.0–0.1)
Lymphocytes Absolute: 2.5 10*3/uL (ref 0.7–3.1)
Lymphs: 38 %
MCH: 29.9 pg (ref 26.6–33.0)
MCHC: 32.1 g/dL (ref 31.5–35.7)
MCV: 93 fL (ref 79–97)
MONOS ABS: 0.6 10*3/uL (ref 0.1–0.9)
Monocytes: 9 %
NEUTROS PCT: 46 %
Neutrophils Absolute: 3.1 10*3/uL (ref 1.4–7.0)
PLATELETS: 293 10*3/uL (ref 150–379)
RBC: 3.65 x10E6/uL — AB (ref 3.77–5.28)
RDW: 14.6 % (ref 12.3–15.4)
WBC: 6.7 10*3/uL (ref 3.4–10.8)

## 2017-08-07 LAB — LIPID PANEL
CHOLESTEROL TOTAL: 161 mg/dL (ref 100–199)
Chol/HDL Ratio: 3.7 ratio (ref 0.0–4.4)
HDL: 44 mg/dL (ref >39)
LDL CALC: 90 mg/dL (ref 0–99)
TRIGLYCERIDES: 135 mg/dL (ref 0–149)
VLDL CHOLESTEROL CAL: 27 mg/dL (ref 5–40)

## 2017-08-07 LAB — CMP14+EGFR
ALK PHOS: 79 IU/L (ref 39–117)
ALT: 8 IU/L (ref 0–32)
AST: 18 IU/L (ref 0–40)
Albumin/Globulin Ratio: 1.5 (ref 1.2–2.2)
Albumin: 4.3 g/dL (ref 3.5–4.8)
BILIRUBIN TOTAL: 0.9 mg/dL (ref 0.0–1.2)
BUN/Creatinine Ratio: 16 (ref 12–28)
BUN: 31 mg/dL — AB (ref 8–27)
CHLORIDE: 102 mmol/L (ref 96–106)
CO2: 23 mmol/L (ref 20–29)
CREATININE: 1.94 mg/dL — AB (ref 0.57–1.00)
Calcium: 9.5 mg/dL (ref 8.7–10.3)
GFR calc Af Amer: 28 mL/min/{1.73_m2} — ABNORMAL LOW (ref >59)
GFR calc non Af Amer: 24 mL/min/{1.73_m2} — ABNORMAL LOW (ref >59)
GLUCOSE: 110 mg/dL — AB (ref 65–99)
Globulin, Total: 2.8 g/dL (ref 1.5–4.5)
Potassium: 4.1 mmol/L (ref 3.5–5.2)
Sodium: 143 mmol/L (ref 134–144)
Total Protein: 7.1 g/dL (ref 6.0–8.5)

## 2017-08-13 ENCOUNTER — Other Ambulatory Visit: Payer: Self-pay | Admitting: *Deleted

## 2017-08-13 DIAGNOSIS — I10 Essential (primary) hypertension: Secondary | ICD-10-CM

## 2017-08-15 ENCOUNTER — Other Ambulatory Visit: Payer: Medicare HMO

## 2017-08-15 DIAGNOSIS — I10 Essential (primary) hypertension: Secondary | ICD-10-CM

## 2017-08-16 LAB — MICROALBUMIN / CREATININE URINE RATIO
Creatinine, Urine: 144.3 mg/dL
Microalbumin, Urine: <3 ug/mL

## 2017-08-16 LAB — BMP8+EGFR
BUN / CREAT RATIO: 14 (ref 12–28)
BUN: 21 mg/dL (ref 8–27)
CO2: 24 mmol/L (ref 20–29)
CREATININE: 1.53 mg/dL — AB (ref 0.57–1.00)
Calcium: 9.6 mg/dL (ref 8.7–10.3)
Chloride: 102 mmol/L (ref 96–106)
GFR calc Af Amer: 38 mL/min/{1.73_m2} — ABNORMAL LOW (ref >59)
GFR, EST NON AFRICAN AMERICAN: 33 mL/min/{1.73_m2} — AB (ref >59)
GLUCOSE: 101 mg/dL — AB (ref 65–99)
POTASSIUM: 3.9 mmol/L (ref 3.5–5.2)
SODIUM: 146 mmol/L — AB (ref 134–144)

## 2017-08-22 DIAGNOSIS — I10 Essential (primary) hypertension: Secondary | ICD-10-CM | POA: Diagnosis not present

## 2017-08-22 DIAGNOSIS — H524 Presbyopia: Secondary | ICD-10-CM | POA: Diagnosis not present

## 2017-10-30 ENCOUNTER — Other Ambulatory Visit: Payer: Self-pay

## 2017-10-30 ENCOUNTER — Ambulatory Visit (INDEPENDENT_AMBULATORY_CARE_PROVIDER_SITE_OTHER): Payer: Medicare HMO | Admitting: *Deleted

## 2017-10-30 ENCOUNTER — Ambulatory Visit (INDEPENDENT_AMBULATORY_CARE_PROVIDER_SITE_OTHER): Payer: Medicare HMO

## 2017-10-30 ENCOUNTER — Encounter: Payer: Self-pay | Admitting: *Deleted

## 2017-10-30 VITALS — BP 148/85 | HR 75 | Temp 96.7°F | Ht 64.0 in | Wt 179.8 lb

## 2017-10-30 DIAGNOSIS — Z13820 Encounter for screening for osteoporosis: Secondary | ICD-10-CM

## 2017-10-30 DIAGNOSIS — Z780 Asymptomatic menopausal state: Secondary | ICD-10-CM

## 2017-10-30 DIAGNOSIS — Z0000 Encounter for general adult medical examination without abnormal findings: Secondary | ICD-10-CM

## 2017-10-30 DIAGNOSIS — M8589 Other specified disorders of bone density and structure, multiple sites: Secondary | ICD-10-CM | POA: Diagnosis not present

## 2017-10-30 NOTE — Patient Instructions (Addendum)
Wendy Barajas , Thank you for taking time to come for your Medicare Wellness Visit. I appreciate your ongoing commitment to your health goals. Please review the following plan we discussed and let me know if I can assist you in the future.   These are the goals we discussed: Goals    . Increase physical activity (pt-stated)     Increase physical activity by walking 3 times a week for 30 minutes at a time weather permitting        This is a list of the screening recommended for you and due dates:  Sit-to-Stand Exercise The sit-to-stand exercise (also known as the chair stand or chair rise exercise) strengthens your lower body and helps you maintain or improve your mobility and independence. The goal is to do the sit-to-stand exercise without using your hands. This will be easier as you become stronger. You should always talk with your health care provider before starting any exercise program, especially if you have had recent surgery. Do the exercise exactly as told by your health care provider and adjust it as directed. It is normal to feel mild stretching, pulling, tightness, or discomfort as you do this exercise, but you should stop right away if you feel sudden pain or your pain gets worse. Do not begin doing this exercise until told by your health care provider. What the sit-to-stand exercise does The sit-to-stand exercise helps to strengthen the muscles in your thighs and the muscles in the center of your body that give you stability (core muscles). This exercise is especially helpful if:  You have had knee or hip surgery.  You have trouble getting up from a chair, out of a car, or off the toilet.  How to do the sit-to-stand exercise 1. Sit toward the front edge of a sturdy chair without armrests. Your knees should be bent and your feet should be flat on the floor and shoulder-width apart. 2. Place your hands lightly on each side of the seat. Keep your back and neck as straight as  possible, with your chest slightly forward. 3. Breathe in slowly. Lean forward and slightly shift your weight to the front of your feet. 4. Breathe out as you slowly stand up. Use your hands as little as possible. 5. Stand and pause for a full breath in and out. 6. Breathe in as you sit down slowly. Tighten your core and abdominal muscles to control your lowering as much as possible. 7. Breathe out slowly. 8. Do this exercise 10-15 times. If needed, do it fewer times until you build up strength. 9. Rest for 1 minute, then do another set of 10-15 repetitions. To change the difficulty of the sit-to-stand exercise  If the exercise is too difficult, use a chair with sturdy armrests, and push off the armrests to help you come to the standing position. You can also use the armrests to help slowly lower yourself back to sitting. As this gets easier, try to use your arms less. You can also place a firm cushion or pillow on the chair to make the surface higher.  If this exercise is too easy, do not use your arms to help raise or lower yourself. You can also wear a weighted vest, use hand weights, increase your repetitions, or try a lower chair. General tips  You may feel tired when starting an exercise routine. This is normal.  You may have muscle soreness that lasts a few days. This is normal. As you get stronger,  you may not feel muscle soreness.  Use smooth, steady movements.  Do not  hold your breath during strength exercises. This can cause unsafe changes in your blood pressure.  Breathe in slowly through your nose, and breathe out slowly through your mouth. Summary  Strengthening your lower body is an important step to help you move safely and independently.  The sit-to-stand exercise helps strengthen the muscles in your thighs and core.  You should always talk with your health care provider before starting any exercise program, especially if you have had recent surgery. This information  is not intended to replace advice given to you by your health care provider. Make sure you discuss any questions you have with your health care provider. Document Released: 09/19/2016 Document Revised: 09/19/2016 Document Reviewed: 09/19/2016 Elsevier Interactive Patient Education  2018 [facility] Maintenance  Topic Date Due  . DEXA scan (bone density measurement)  07/04/2005  . Tetanus Vaccine  10/31/2018*  . Pneumonia vaccines (1 of 2 - PCV13) 10/31/2018*  . Flu Shot  Completed  *Topic was postponed. The date shown is not the original due date.

## 2017-10-30 NOTE — Progress Notes (Signed)
Subjective:    Wendy Barajas is a 77 y.o. female who presents for a Welcome to Medicare exam. 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 She lives alone and has one cat. She has one son. 2 granddaughters and 3 great grandchildren.He reports that her health is about the same as last year and she is healthy for her age. She maintains her own home and finances. She has no complaints at this time. Alert and oriented to person,place, and time. Pleasant and talkative.  Cardiac Risk Factors include: none      Objective:    Today's Vitals   10/30/17 1014  BP: (!) 148/85  Pulse: 75  Temp: (!) 96.7 F (35.9 C)  TempSrc: Oral  Weight: 179 lb 12.8 oz (81.6 kg)  Height: 5\' 4"  (1.626 m)  PainSc: 0-No pain  Body mass index is 30.86 kg/m.  Medications Outpatient Encounter Medications as of 10/30/2017  Medication Sig  . Cholecalciferol (VITAMIN D3) 400 UNITS CAPS Take 1 capsule by mouth daily.   Marland Kitchen loratadine (CLARITIN) 10 MG tablet Take 1 tablet (10 mg total) by mouth daily.  Marland Kitchen losartan-hydrochlorothiazide (HYZAAR) 100-25 MG tablet Take 1 tablet by mouth daily.  . pantoprazole (PROTONIX) 40 MG tablet Take 1 tablet (40 mg total) by mouth daily.  . simvastatin (ZOCOR) 20 MG tablet Take 1 tablet (20 mg total) by mouth daily.   No facility-administered encounter medications on file as of 10/30/2017.      History: Past Medical History:  Diagnosis Date  . Allergy   . GERD (gastroesophageal reflux disease)   . Hyperlipidemia   . Hypertension    Past Surgical History:  Procedure Laterality Date  . ABDOMINAL HYSTERECTOMY      Family History  Problem Relation Age of Onset  . Cancer Father   . Diabetes Mother   . Heart disease Mother   . Cancer Brother   . Stroke Brother    Social History   Occupational History  . Occupation: Retired   Tobacco Use  . Smoking status: Never Smoker  . Smokeless tobacco: Never Used  Substance and Sexual Activity  .  Alcohol use: No    Alcohol/week: 0.0 oz  . Drug use: No  . Sexual activity: Not Currently    Tobacco Counseling Counseling given: No   Immunizations and Health Maintenance Immunization History  Administered Date(s) Administered  . Influenza, High Dose Seasonal PF 06/26/2017  . Influenza,inj,Quad PF,6+ Mos 06/20/2015   Health Maintenance    10/30/2017 DEXA SCAN    10/31/2018 TETANUS/TDAP    10/31/2018 PNA vac Low Risk Adult (1 of 2 - PCV13        Activities of Daily Living In your present state of health, do you have any difficulty performing the following activities: 10/30/2017  Hearing? N  Vision? N  Difficulty concentrating or making decisions? N  Walking or climbing stairs? N  Dressing or bathing? N  Doing errands, shopping? N  Preparing Food and eating ? N  Using the Toilet? N  In the past six months, have you accidently leaked urine? N  Do you have problems with loss of bowel control? N  Managing your Medications? N  Managing your Finances? N  Housekeeping or managing your Housekeeping? N  Some recent data might be hidden    Advanced Directives: Does Patient Have a Medical Advance Directive?: No Would patient like information on creating a medical advance directive?: Yes (ED - Information included in AVS)  Assessment:    This is a routine wellness examination for this patient .   Vision/Hearing screen No vision or hearing deficits noted. She gets yearly eye exams at [facility] in [place].   Dietary issues and exercise activities discussed:  Current Exercise Habits: The patient does not participate in regular exercise at present, Exercise limited by: None identified  Goals    . Increase physical activity (pt-stated)     Increase physical activity by walking 3 times a week for 30 minutes at a time weather permitting       Depression Screen PHQ 2/9 Scores 10/30/2017 08/06/2017 02/07/2017 08/08/2016  PHQ - 2 Score 0 0 0 0     Fall Risk Fall  Risk  10/30/2017  Falls in the past year? No    Cognitive Function: MMSE - Mini Mental State Exam 10/30/2017  Orientation to time 5  Orientation to Place 5  Registration 3  Attention/ Calculation 5  Recall 3  Language- name 2 objects 2  Language- repeat 1  Language- follow 3 step command 3  Language- read & follow direction 1  Write a sentence 1  Copy design 1  Total score 30        Patient Care Team: Theodoro Clock as PCP - General (Physician Assistant)     Plan:   Dexascan completed today. Patient refuses Prevnar,Tdap, or Shingrix at this time. Will call her and schedule a mammogram she has not had one since 2015. Seated exercises given to improve muscle strength and flexibility. Try to have at least 3 healthy meals per day.  Has follow up with [facility] on 02/04/18 @ 8:10am   I have personally reviewed and noted the following in the patient's chart:   . Medical and social history . Use of alcohol, tobacco or illicit drugs  . Current medications and supplements . Functional ability and status . Nutritional status . Physical activity . Advanced directives . List of other physicians . Hospitalizations, surgeries, and ER visits in previous 12 months . Vitals . Screenings to include cognitive, depression, and falls . Referrals and appointments  In addition, I have reviewed and discussed with patient certain preventive protocols, quality metrics, and best practice recommendations. A written personalized care plan for preventive services as well as general preventive health recommendations were provided to patient.     Torrie Mayers, RN 10/30/2017   I have reviewed and agree with the above AWV documentation.   Laroy Apple, MD [facility] 10/30/2017, 1:48 PM

## 2018-02-04 ENCOUNTER — Encounter: Payer: Self-pay | Admitting: Physician Assistant

## 2018-02-04 ENCOUNTER — Ambulatory Visit (INDEPENDENT_AMBULATORY_CARE_PROVIDER_SITE_OTHER): Payer: Medicare HMO | Admitting: Physician Assistant

## 2018-02-04 VITALS — BP 119/68 | HR 75 | Temp 97.5°F | Ht 64.0 in | Wt 179.8 lb

## 2018-02-04 DIAGNOSIS — I10 Essential (primary) hypertension: Secondary | ICD-10-CM

## 2018-02-04 DIAGNOSIS — K219 Gastro-esophageal reflux disease without esophagitis: Secondary | ICD-10-CM | POA: Diagnosis not present

## 2018-02-04 LAB — CBC WITH DIFFERENTIAL/PLATELET
BASOS: 1 %
Basophils Absolute: 0 10*3/uL (ref 0.0–0.2)
EOS (ABSOLUTE): 0.3 10*3/uL (ref 0.0–0.4)
EOS: 4 %
HEMATOCRIT: 33.5 % — AB (ref 34.0–46.6)
Hemoglobin: 11 g/dL — ABNORMAL LOW (ref 11.1–15.9)
Immature Grans (Abs): 0 10*3/uL (ref 0.0–0.1)
Immature Granulocytes: 0 %
Lymphocytes Absolute: 2.3 10*3/uL (ref 0.7–3.1)
Lymphs: 34 %
MCH: 30 pg (ref 26.6–33.0)
MCHC: 32.8 g/dL (ref 31.5–35.7)
MCV: 91 fL (ref 79–97)
MONOCYTES: 7 %
MONOS ABS: 0.5 10*3/uL (ref 0.1–0.9)
NEUTROS PCT: 54 %
Neutrophils Absolute: 3.8 10*3/uL (ref 1.4–7.0)
Platelets: 293 10*3/uL (ref 150–450)
RBC: 3.67 x10E6/uL — AB (ref 3.77–5.28)
RDW: 13.4 % (ref 12.3–15.4)
WBC: 6.8 10*3/uL (ref 3.4–10.8)

## 2018-02-04 LAB — CMP14+EGFR
A/G RATIO: 1.7 (ref 1.2–2.2)
ALT: 7 IU/L (ref 0–32)
AST: 11 IU/L (ref 0–40)
Albumin: 4.3 g/dL (ref 3.5–4.8)
Alkaline Phosphatase: 77 IU/L (ref 39–117)
BILIRUBIN TOTAL: 0.6 mg/dL (ref 0.0–1.2)
BUN/Creatinine Ratio: 19 (ref 12–28)
BUN: 31 mg/dL — ABNORMAL HIGH (ref 8–27)
CHLORIDE: 102 mmol/L (ref 96–106)
CO2: 24 mmol/L (ref 20–29)
Calcium: 9.7 mg/dL (ref 8.7–10.3)
Creatinine, Ser: 1.61 mg/dL — ABNORMAL HIGH (ref 0.57–1.00)
GFR calc Af Amer: 35 mL/min/{1.73_m2} — ABNORMAL LOW (ref >59)
GFR calc non Af Amer: 31 mL/min/{1.73_m2} — ABNORMAL LOW (ref >59)
GLOBULIN, TOTAL: 2.5 g/dL (ref 1.5–4.5)
Glucose: 107 mg/dL — ABNORMAL HIGH (ref 65–99)
POTASSIUM: 3.9 mmol/L (ref 3.5–5.2)
SODIUM: 142 mmol/L (ref 134–144)
Total Protein: 6.8 g/dL (ref 6.0–8.5)

## 2018-02-04 NOTE — Progress Notes (Signed)
BP 119/68   Pulse 75   Temp (!) 97.5 F (36.4 C) (Oral)   Ht _0  (1.626 m)   Wt 179 lb 12.8 oz (81.6 kg)   BMI 30.86 kg/m    Subjective:    Patient ID: Wendy Barajas, female    DOB: 18-Mar-1941, 77 y.o.   MRN: [id number]  HPI: Wendy Barajas is a 77 y.o. female presenting on 02/04/2018 for Hypertension and Hyperlipidemia This patient comes in for periodic recheck on her chronic medical conditions.  Overall that she is doing well and not having any difficulties.  Sometimes her left knee will bother her but it is not a constant thing it has not gone after she has not had any injury to it she is quite still in her home and yard.  She has no shortness of breath with regular activity she does not decrease the normal things that she performs.  Her GERD is very well controlled.  She is tolerating her medications well.     medical history is positive for hypertension, GERD, allergic rhinitis, hyperlipidemia.   Past Medical History:  Diagnosis Date  . Allergy   . GERD (gastroesophageal reflux disease)   . Hyperlipidemia   . Hypertension    Relevant past medical, surgical, family and social history reviewed and updated as indicated. Interim medical history since our last visit reviewed. Allergies and medications reviewed and updated. DATA REVIEWED: CHART IN EPIC  Family History reviewed for pertinent findings.  Review of Systems  Constitutional: Negative.  Negative for activity change, fatigue and fever.  HENT: Negative.   Eyes: Negative.   Respiratory: Negative.  Negative for cough.   Cardiovascular: Negative.  Negative for chest pain.  Gastrointestinal: Negative.  Negative for abdominal pain.  Endocrine: Negative.   Genitourinary: Negative.  Negative for dysuria.  Musculoskeletal: Positive for joint swelling.  Skin: Negative.   Neurological: Negative.     Allergies as of 02/04/2018      Reactions   Pollen Extract       Medication List        Accurate as of 02/04/18   2:39 PM. Always use your most recent med list.          calcium carbonate 1250 (500 Ca) MG tablet Commonly known as:  OS-CAL - dosed in mg of elemental calcium Take 1 tablet by mouth.   loratadine 10 MG tablet Commonly known as:  CLARITIN Take 1 tablet (10 mg total) by mouth daily.   losartan-hydrochlorothiazide 100-25 MG tablet Commonly known as:  HYZAAR Take 1 tablet by mouth daily.   pantoprazole 40 MG tablet Commonly known as:  PROTONIX Take 1 tablet (40 mg total) by mouth daily.   simvastatin 20 MG tablet Commonly known as:  ZOCOR Take 1 tablet (20 mg total) by mouth daily.   Vitamin D3 400 units Caps Take 1 capsule by mouth daily.          Objective:    BP 119/68   Pulse 75   Temp (!) 97.5 F (36.4 C) (Oral)   Ht _1  (1.626 m)   Wt 179 lb 12.8 oz (81.6 kg)   BMI 30.86 kg/m   Allergies  Allergen Reactions  . Pollen Extract     Wt Readings from Last 3 Encounters:  02/04/18 179 lb 12.8 oz (81.6 kg)  10/30/17 179 lb 12.8 oz (81.6 kg)  08/06/17 183 lb (83 kg)    Physical Exam  Constitutional: She is oriented to person,  place, and time. She appears well-developed and well-nourished.  HENT:  Head: Normocephalic and atraumatic.  Right Ear: Tympanic membrane, external ear and ear canal normal.  Left Ear: Tympanic membrane, external ear and ear canal normal.  Nose: Nose normal. No rhinorrhea.  Mouth/Throat: Oropharynx is clear and moist and mucous membranes are normal. No oropharyngeal exudate or posterior oropharyngeal erythema.  Eyes: Pupils are equal, round, and reactive to light. Conjunctivae and EOM are normal.  Neck: Normal range of motion. Neck supple.  Cardiovascular: Normal rate, regular rhythm, normal heart sounds and intact distal pulses.  Pulmonary/Chest: Effort normal and breath sounds normal.  Abdominal: Soft. Bowel sounds are normal.  Neurological: She is alert and oriented to person, place, and time. She has normal reflexes.  Skin: Skin  is warm and dry. No rash noted.  Psychiatric: She has a normal mood and affect. Her behavior is normal. Judgment and thought content normal.    Results for orders placed or performed in visit on 08/15/17  [facility]  Result Value Ref Range   Glucose 101 (H) 65 - 99 mg/dL   BUN 21 8 - 27 mg/dL   Creatinine, Ser 1.53 (H) 0.57 - 1.00 mg/dL   GFR calc non Af Amer 33 (L) >59 mL/min/1.73   GFR calc Af Amer 38 (L) >59 mL/min/1.73   BUN/Creatinine Ratio 14 12 - 28   Sodium 146 (H) 134 - 144 mmol/L   Potassium 3.9 3.5 - 5.2 mmol/L   Chloride 102 96 - 106 mmol/L   CO2 24 20 - 29 mmol/L   Calcium 9.6 8.7 - 10.3 mg/dL  Microalbumin / creatinine urine ratio  Result Value Ref Range   Creatinine, Urine 144.3 Not Estab. mg/dL   Microalbumin, Urine <3.0 Not Estab. ug/mL   Microalb/Creat Ratio <2.1 0.0 - 30.0 mg/g creat      Assessment & Plan:   1. Essential hypertension - CBC with Differential/Platelet - CMP14+EGFR  2. Gastroesophageal reflux disease without esophagitis - CBC with Differential/Platelet - CMP14+EGFR   Continue all other maintenance medications as listed above.  Follow up plan: Return in about 6 months (around 08/06/2018) for recheck and annual labs.  Educational handout given for [place] PA-C [place] [address]  [place],  [address] [phone]   02/04/2018, 2:39 PM

## 2018-02-10 ENCOUNTER — Encounter: Payer: Self-pay | Admitting: *Deleted

## 2018-03-30 ENCOUNTER — Other Ambulatory Visit: Payer: Self-pay | Admitting: Physician Assistant

## 2018-05-29 ENCOUNTER — Encounter: Payer: Self-pay | Admitting: *Deleted

## 2018-06-03 ENCOUNTER — Other Ambulatory Visit: Payer: Self-pay | Admitting: Physician Assistant

## 2018-06-03 DIAGNOSIS — K219 Gastro-esophageal reflux disease without esophagitis: Secondary | ICD-10-CM

## 2018-06-03 DIAGNOSIS — E782 Mixed hyperlipidemia: Secondary | ICD-10-CM

## 2018-08-10 ENCOUNTER — Other Ambulatory Visit: Payer: Self-pay | Admitting: Physician Assistant

## 2018-08-10 ENCOUNTER — Ambulatory Visit: Payer: Medicare HMO | Admitting: Physician Assistant

## 2018-08-10 DIAGNOSIS — K219 Gastro-esophageal reflux disease without esophagitis: Secondary | ICD-10-CM

## 2018-08-10 DIAGNOSIS — E782 Mixed hyperlipidemia: Secondary | ICD-10-CM

## 2018-08-13 NOTE — Telephone Encounter (Signed)
OV 08/17/18

## 2018-08-17 ENCOUNTER — Encounter: Payer: Self-pay | Admitting: Physician Assistant

## 2018-08-17 ENCOUNTER — Ambulatory Visit: Payer: Medicare HMO | Admitting: Physician Assistant

## 2018-08-17 ENCOUNTER — Other Ambulatory Visit: Payer: Self-pay | Admitting: *Deleted

## 2018-08-17 ENCOUNTER — Ambulatory Visit (INDEPENDENT_AMBULATORY_CARE_PROVIDER_SITE_OTHER): Payer: Medicare HMO

## 2018-08-17 VITALS — BP 146/69 | HR 66 | Temp 98.4°F | Ht 64.0 in | Wt 177.2 lb

## 2018-08-17 DIAGNOSIS — M25562 Pain in left knee: Secondary | ICD-10-CM

## 2018-08-17 DIAGNOSIS — R0609 Other forms of dyspnea: Secondary | ICD-10-CM | POA: Diagnosis not present

## 2018-08-17 DIAGNOSIS — G8929 Other chronic pain: Secondary | ICD-10-CM

## 2018-08-17 DIAGNOSIS — K219 Gastro-esophageal reflux disease without esophagitis: Secondary | ICD-10-CM

## 2018-08-17 DIAGNOSIS — Z136 Encounter for screening for cardiovascular disorders: Secondary | ICD-10-CM | POA: Diagnosis not present

## 2018-08-17 DIAGNOSIS — I10 Essential (primary) hypertension: Secondary | ICD-10-CM | POA: Diagnosis not present

## 2018-08-17 DIAGNOSIS — Z0000 Encounter for general adult medical examination without abnormal findings: Secondary | ICD-10-CM | POA: Diagnosis not present

## 2018-08-17 LAB — CBC WITH DIFFERENTIAL/PLATELET
BASOS ABS: 0 10*3/uL (ref 0.0–0.2)
Basos: 1 %
EOS (ABSOLUTE): 0.3 10*3/uL (ref 0.0–0.4)
Eos: 5 %
Hematocrit: 34.7 % (ref 34.0–46.6)
Hemoglobin: 11.6 g/dL (ref 11.1–15.9)
Immature Grans (Abs): 0 10*3/uL (ref 0.0–0.1)
Immature Granulocytes: 0 %
Lymphocytes Absolute: 2.1 10*3/uL (ref 0.7–3.1)
Lymphs: 30 %
MCH: 30.5 pg (ref 26.6–33.0)
MCHC: 33.4 g/dL (ref 31.5–35.7)
MCV: 91 fL (ref 79–97)
Monocytes Absolute: 0.5 10*3/uL (ref 0.1–0.9)
Monocytes: 7 %
NEUTROS PCT: 57 %
Neutrophils Absolute: 4 10*3/uL (ref 1.4–7.0)
Platelets: 288 10*3/uL (ref 150–450)
RBC: 3.8 x10E6/uL (ref 3.77–5.28)
RDW: 13.5 % (ref 11.7–15.4)
WBC: 6.9 10*3/uL (ref 3.4–10.8)

## 2018-08-17 LAB — CMP14+EGFR
ALT: 5 IU/L (ref 0–32)
AST: 16 IU/L (ref 0–40)
Albumin/Globulin Ratio: 1.7 (ref 1.2–2.2)
Albumin: 4.5 g/dL (ref 3.5–4.8)
Alkaline Phosphatase: 89 IU/L (ref 39–117)
BUN/Creatinine Ratio: 16 (ref 12–28)
BUN: 32 mg/dL — ABNORMAL HIGH (ref 8–27)
Bilirubin Total: 1 mg/dL (ref 0.0–1.2)
CO2: 24 mmol/L (ref 20–29)
Calcium: 9.7 mg/dL (ref 8.7–10.3)
Chloride: 102 mmol/L (ref 96–106)
Creatinine, Ser: 1.98 mg/dL — ABNORMAL HIGH (ref 0.57–1.00)
GFR calc Af Amer: 27 mL/min/{1.73_m2} — ABNORMAL LOW (ref >59)
GFR calc non Af Amer: 24 mL/min/{1.73_m2} — ABNORMAL LOW (ref >59)
Globulin, Total: 2.6 g/dL (ref 1.5–4.5)
Glucose: 102 mg/dL — ABNORMAL HIGH (ref 65–99)
POTASSIUM: 4.7 mmol/L (ref 3.5–5.2)
Sodium: 141 mmol/L (ref 134–144)
TOTAL PROTEIN: 7.1 g/dL (ref 6.0–8.5)

## 2018-08-17 LAB — LIPID PANEL
Chol/HDL Ratio: 3.2 ratio (ref 0.0–4.4)
Cholesterol, Total: 159 mg/dL (ref 100–199)
HDL: 49 mg/dL (ref >39)
LDL Calculated: 88 mg/dL (ref 0–99)
Triglycerides: 111 mg/dL (ref 0–149)
VLDL Cholesterol Cal: 22 mg/dL (ref 5–40)

## 2018-08-17 MED ORDER — PANTOPRAZOLE SODIUM 40 MG PO TBEC: 40.0000 mg | DELAYED_RELEASE_TABLET | Freq: Every day | ORAL | 3 refills | Status: DC

## 2018-08-17 MED ORDER — LORATADINE 10 MG PO TABS: 10.0000 mg | ORAL_TABLET | Freq: Every day | ORAL | 3 refills | Status: DC

## 2018-08-17 MED ORDER — LOSARTAN POTASSIUM-HCTZ 100-25 MG PO TABS: 1.0000 | ORAL_TABLET | Freq: Every day | ORAL | 3 refills | Status: DC

## 2018-08-17 NOTE — Progress Notes (Signed)
BP (!) 146/69   Pulse 66   Temp 98.4 F (36.9 C) (Oral)   Ht '5\' 4"'$  (1.626 m)   Wt 177 lb 3.2 oz (80.4 kg)   BMI 30.42 kg/m    Subjective:    Patient ID: [name], female    DOB: Feb 25, 1941, 78 y.o.   MRN: [id number]  HPI: Wendy Barajas is a 78 y.o. female presenting on 08/17/2018 for Hypertension (6 month follow up ); Hyperlipidemia; and Medical Management of Chronic Issues  This patient comes in for periodic recheck on her chronic medical conditions.  They do include rhinitis, GERD, hyperlipidemia, hypertension.  She states that she was feeling very good except for couple of arthritis affected joints.  She tries to deal with it is the best she can.  She states that she is still having some dyspnea anymore whenever she is having exertion.  She has tried albuterol in the past without any relief.  Years ago she did have some asthma problems she has not been pulmonology in many years.  Past Medical History:  Diagnosis Date  . Allergy   . GERD (gastroesophageal reflux disease)   . Hyperlipidemia   . Hypertension    Relevant past medical, surgical, family and social history reviewed and updated as indicated. Interim medical history since our last visit reviewed. Allergies and medications reviewed and updated. DATA REVIEWED: CHART IN EPIC  Family History reviewed for pertinent findings.  Review of Systems  Constitutional: Negative.   HENT: Negative.   Eyes: Negative.   Respiratory: Positive for shortness of breath. Negative for cough, choking, wheezing and stridor.   Gastrointestinal: Negative.   Genitourinary: Negative.   Musculoskeletal: Positive for arthralgias.    Allergies as of 08/17/2018      Reactions   Pollen Extract       Medication List       Accurate as of August 17, 2018 11:59 PM. Always use your most recent med list.        calcium carbonate 1250 (500 Ca) MG tablet Commonly known as:  OS-CAL - dosed in mg of elemental calcium Take 1 tablet by  mouth.   loratadine 10 MG tablet Commonly known as:  CLARITIN Take 1 tablet (10 mg total) by mouth daily.   losartan-hydrochlorothiazide 100-25 MG tablet Commonly known as:  HYZAAR Take 1 tablet by mouth daily.   pantoprazole 40 MG tablet Commonly known as:  PROTONIX Take 1 tablet (40 mg total) by mouth daily.   simvastatin 20 MG tablet Commonly known as:  ZOCOR TAKE 1 TABLET EVERY DAY   Vitamin D3 10 MCG (400 UNIT) Caps Take 1 capsule by mouth daily.          Objective:    BP (!) 146/69   Pulse 66   Temp 98.4 F (36.9 C) (Oral)   Ht '5\' 4"'$  (1.626 m)   Wt 177 lb 3.2 oz (80.4 kg)   BMI 30.42 kg/m   Allergies  Allergen Reactions  . Pollen Extract     Wt Readings from Last 3 Encounters:  08/20/18 177 lb (80.3 kg)  08/17/18 177 lb 3.2 oz (80.4 kg)  02/04/18 179 lb 12.8 oz (81.6 kg)    Physical Exam Constitutional:      Appearance: She is well-developed.  HENT:     Head: Normocephalic and atraumatic.     Right Ear: Tympanic membrane, ear canal and external ear normal.     Left Ear: Tympanic membrane, ear canal and external  ear normal.     Nose: Nose normal. No rhinorrhea.     Mouth/Throat:     Pharynx: No oropharyngeal exudate or posterior oropharyngeal erythema.  Eyes:     Conjunctiva/sclera: Conjunctivae normal.     Pupils: Pupils are equal, round, and reactive to light.  Neck:     Musculoskeletal: Normal range of motion and neck supple.  Cardiovascular:     Rate and Rhythm: Normal rate and regular rhythm.     Heart sounds: Normal heart sounds.  Pulmonary:     Effort: Pulmonary effort is normal.     Breath sounds: Normal breath sounds.  Abdominal:     General: Bowel sounds are normal.     Palpations: Abdomen is soft.  Skin:    General: Skin is warm and dry.     Findings: No rash.  Neurological:     Mental Status: She is alert and oriented to person, place, and time.     Deep Tendon Reflexes: Reflexes are normal and symmetric.  Psychiatric:         Behavior: Behavior normal.        Thought Content: Thought content normal.        Judgment: Judgment normal.     Results for orders placed or performed in visit on 08/17/18  CBC with Differential/Platelet  Result Value Ref Range   WBC 6.9 3.4 - 10.8 x10E3/uL   RBC 3.80 3.77 - 5.28 x10E6/uL   Hemoglobin 11.6 11.1 - 15.9 g/dL   Hematocrit 34.7 34.0 - 46.6 %   MCV 91 79 - 97 fL   MCH 30.5 26.6 - 33.0 pg   MCHC 33.4 31.5 - 35.7 g/dL   RDW 13.5 11.7 - 15.4 %   Platelets 288 150 - 450 x10E3/uL   Neutrophils 57 Not Estab. %   Lymphs 30 Not Estab. %   Monocytes 7 Not Estab. %   Eos 5 Not Estab. %   Basos 1 Not Estab. %   Neutrophils Absolute 4.0 1.4 - 7.0 x10E3/uL   Lymphocytes Absolute 2.1 0.7 - 3.1 x10E3/uL   Monocytes Absolute 0.5 0.1 - 0.9 x10E3/uL   EOS (ABSOLUTE) 0.3 0.0 - 0.4 x10E3/uL   Basophils Absolute 0.0 0.0 - 0.2 x10E3/uL   Immature Granulocytes 0 Not Estab. %   Immature Grans (Abs) 0.0 0.0 - 0.1 x10E3/uL  CMP14+EGFR  Result Value Ref Range   Glucose 102 (H) 65 - 99 mg/dL   BUN 32 (H) 8 - 27 mg/dL   Creatinine, Ser 1.98 (H) 0.57 - 1.00 mg/dL   GFR calc non Af Amer 24 (L) >59 mL/min/1.73   GFR calc Af Amer 27 (L) >59 mL/min/1.73   BUN/Creatinine Ratio 16 12 - 28   Sodium 141 134 - 144 mmol/L   Potassium 4.7 3.5 - 5.2 mmol/L   Chloride 102 96 - 106 mmol/L   CO2 24 20 - 29 mmol/L   Calcium 9.7 8.7 - 10.3 mg/dL   Total Protein 7.1 6.0 - 8.5 g/dL   Albumin 4.5 3.5 - 4.8 g/dL   Globulin, Total 2.6 1.5 - 4.5 g/dL   Albumin/Globulin Ratio 1.7 1.2 - 2.2   Bilirubin Total 1.0 0.0 - 1.2 mg/dL   Alkaline Phosphatase 89 39 - 117 IU/L   AST 16 0 - 40 IU/L   ALT 5 0 - 32 IU/L  Lipid panel  Result Value Ref Range   Cholesterol, Total 159 100 - 199 mg/dL   Triglycerides 111 0 - 149 mg/dL  HDL 49 >39 mg/dL   VLDL Cholesterol Cal 22 5 - 40 mg/dL   LDL Calculated 88 0 - 99 mg/dL   Chol/HDL Ratio 3.2 0.0 - 4.4 ratio      Assessment & Plan:   1. Gastroesophageal reflux  disease without esophagitis - pantoprazole (PROTONIX) 40 MG tablet; Take 1 tablet (40 mg total) by mouth daily.  Dispense: 90 tablet; Refill: 3  2. Chronic pain of left knee - DG Knee 1-2 Views Left; Future  3. Essential hypertension  4. DOE (dyspnea on exertion) - Ambulatory referral to Pulmonology  5. Well adult exam - CBC with Differential/Platelet - CMP14+EGFR - Lipid panel   Continue all other maintenance medications as listed above.  Follow up plan: No follow-ups on file.  Educational handout given for [place] PA-C [facility] [address]  [place], [place] [address] [phone]   08/20/2018, 8:48 PM

## 2018-08-19 ENCOUNTER — Other Ambulatory Visit: Payer: Self-pay | Admitting: Physician Assistant

## 2018-08-20 ENCOUNTER — Encounter (INDEPENDENT_AMBULATORY_CARE_PROVIDER_SITE_OTHER): Payer: Self-pay | Admitting: Orthopaedic Surgery

## 2018-08-20 ENCOUNTER — Ambulatory Visit (INDEPENDENT_AMBULATORY_CARE_PROVIDER_SITE_OTHER): Payer: Medicare HMO | Admitting: Orthopaedic Surgery

## 2018-08-20 VITALS — BP 138/76 | HR 67 | Ht 63.0 in | Wt 177.0 lb

## 2018-08-20 DIAGNOSIS — M25562 Pain in left knee: Secondary | ICD-10-CM | POA: Diagnosis not present

## 2018-08-20 DIAGNOSIS — M659 Synovitis and tenosynovitis, unspecified: Secondary | ICD-10-CM

## 2018-08-20 MED ORDER — BUPIVACAINE HCL 0.5 % IJ SOLN
3.0000 mL | INTRAMUSCULAR | Status: AC | PRN
  Administered 2018-08-20: 3 mL via INTRA_ARTICULAR

## 2018-08-20 MED ORDER — METHYLPREDNISOLONE ACETATE 40 MG/ML IJ SUSP
40.0000 mg | INTRAMUSCULAR | Status: AC | PRN
  Administered 2018-08-20: 40 mg via INTRA_ARTICULAR

## 2018-08-20 MED ORDER — LIDOCAINE HCL 1 % IJ SOLN
0.5000 mL | INTRAMUSCULAR | Status: AC | PRN
  Administered 2018-08-20: .5 mL

## 2018-08-20 NOTE — Progress Notes (Signed)
Office Visit Note   Patient: Wendy Barajas           Date of Birth: August 07, 1941           MRN: [id number] Visit Date: 08/20/2018              Requested by: Terald Sleeper, PA-C [address] [place], [place] [address] PCP: Terald Sleeper, PA-C   Assessment & Plan: Visit Diagnoses:  1. Synovitis of knee     Plan: Articular injection performed.  She had improvement in her knee symptom medial after the injection.  We will see how she does with this if she has persistent problems we can proceed with diagnostic MRI scan of her left knee.  Follow-Up Instructions: Return if symptoms worsen or fail to improve.   Orders:  Orders Placed This Encounter  Procedures  . Large Joint Inj: L knee   No orders of the defined types were placed in this encounter.     Procedures: Large Joint Inj: L knee on 08/20/2018 11:49 AM Indications: joint swelling and pain Details: 22 G 1.5 in needle, anterolateral approach  Arthrogram: No  Medications: 0.5 mL lidocaine 1 %; 3 mL bupivacaine 0.5 %; 40 mg methylPREDNISolone acetate 40 MG/ML Outcome: tolerated well, no immediate complications Procedure, treatment alternatives, risks and benefits explained, specific risks discussed. Consent was given by the patient. Immediately prior to procedure a time out was called to verify the correct patient, procedure, equipment, support staff and site/side marked as required. Patient was prepped and draped in the usual sterile fashion.       Clinical Data: No additional findings.   Subjective: Chief Complaint  Patient presents with  . Left Knee - Pain    HPI 78 year old female seen with chronic left knee pain that is recently gotten worse.  She denies locking or popping she has had difficulty ambulating.  She is not fallen.  Used Aleve actually taking 3 twice a day and I discussed with her she should not exceed 4 in a day and needs to take it with food.  Pain is been adjacent patella some pain medially and  posterior.  No groin pain no associated back pain.  Review of Systems deformity of systems positive for hypertension, asthma, history of sinusitis, chronic left knee pain.   Objective: Vital Signs: BP 138/76   Pulse 67   Ht 5\' 3"  (1.6 m)   Wt 177 lb (80.3 kg)   BMI 31.35 kg/m   Physical Exam Constitutional:      Appearance: She is well-developed.  HENT:     Head: Normocephalic.     Right Ear: External ear normal.     Left Ear: External ear normal.  Eyes:     Pupils: Pupils are equal, round, and reactive to light.  Neck:     Thyroid: No thyromegaly.     Trachea: No tracheal deviation.  Cardiovascular:     Rate and Rhythm: Normal rate.  Pulmonary:     Effort: Pulmonary effort is normal.  Abdominal:     Palpations: Abdomen is soft.  Skin:    General: Skin is warm and dry.  Neurological:     Mental Status: She is alert and oriented to person, place, and time.  Psychiatric:        Behavior: Behavior normal.     Ortho Exam no sciatic notch tenderness.  She ambulates with slight flexed knee gait.  Some pain with hyperextension of the left knee.  She  has some medial joint line tenderness no palpable Baker's cyst.  Normal hip range of motion without pain distal pulses are intact calf is normal good isolated motor strength lower extremity.  Mild crepitus with knee extension.  Ligamentous exam both knees are normal.  2+ synovitis left knee.  Specialty Comments:  No specialty comments available.  Imaging: No results found.   PMFS History: Patient Active Problem List   Diagnosis Date Noted  . Chronic pain of left knee 08/17/2018  . Encounter for screening colonoscopy 02/27/2017  . Chronic idiopathic constipation 02/07/2017  . Well adult exam 08/11/2016  . Acute non-recurrent maxillary sinusitis 08/11/2016  . Gastroesophageal reflux disease without esophagitis 08/11/2016  . Hyperlipidemia 02/14/2015  . Essential hypertension 02/14/2015  . Hypertension 03/30/2012   Past  Medical History:  Diagnosis Date  . Allergy   . GERD (gastroesophageal reflux disease)   . Hyperlipidemia   . Hypertension     Family History  Problem Relation Age of Onset  . Cancer Father   . Diabetes Mother   . Heart disease Mother   . Cancer Brother   . Stroke Brother     Past Surgical History:  Procedure Laterality Date  . ABDOMINAL HYSTERECTOMY     Social History   Occupational History  . Occupation: Retired   Tobacco Use  . Smoking status: Never Smoker  . Smokeless tobacco: Never Used  Substance and Sexual Activity  . Alcohol use: No    Alcohol/week: 0.0 standard drinks  . Drug use: No  . Sexual activity: Not Currently

## 2018-09-09 DIAGNOSIS — R0609 Other forms of dyspnea: Secondary | ICD-10-CM | POA: Diagnosis not present

## 2018-09-09 DIAGNOSIS — Z1211 Encounter for screening for malignant neoplasm of colon: Secondary | ICD-10-CM | POA: Diagnosis not present

## 2018-10-22 ENCOUNTER — Other Ambulatory Visit: Payer: Self-pay | Admitting: Physician Assistant

## 2018-10-22 DIAGNOSIS — E782 Mixed hyperlipidemia: Secondary | ICD-10-CM

## 2018-11-02 ENCOUNTER — Encounter: Payer: Medicare HMO | Admitting: *Deleted

## 2018-12-08 ENCOUNTER — Ambulatory Visit (INDEPENDENT_AMBULATORY_CARE_PROVIDER_SITE_OTHER): Payer: Medicare HMO | Admitting: *Deleted

## 2018-12-08 ENCOUNTER — Other Ambulatory Visit: Payer: Self-pay

## 2018-12-08 DIAGNOSIS — Z0000 Encounter for general adult medical examination without abnormal findings: Secondary | ICD-10-CM

## 2018-12-08 NOTE — Progress Notes (Signed)
MEDICARE ANNUAL WELLNESS VISIT  12/08/2018  Telephone Visit Disclaimer This Medicare AWV was conducted by telephone due to national recommendations for restrictions regarding the COVID-19 Pandemic (e.g. social distancing).  I verified, using two identifiers, that I am speaking with Wendy Barajas or their authorized healthcare agent. I discussed the limitations, risks, security, and privacy concerns of performing an evaluation and management service by telephone and the potential availability of an in-person appointment in the future. The patient expressed understanding and agreed to proceed.   Subjective:   Wendy Barajas is a 78 y.o. female patient of Terald Sleeper, PA-C who had a Medicare Annual Wellness Visit today via telephone. Wendy Barajas is Retired and lives alone. she has 1 child. she reports that she is socially active and does interact with friends and/or family regularly. she is moderately physically active and enjoys gardening.  Patient Care Team: Theodoro Clock as PCP - General (Physician Assistant)  [facility] Utilization Over the Past 12 Months: # of hospitalizations or ER visits: 0 # of surgeries: 0  Review of Systems    Patient reports that her overall health is unchanged compared to last year.  Patient Reported Readings (BP, Pulse, CBG, Weight, etc) n/a  Review of Systems: No complaints  All other systems negative.  Pain Assessment Pain : No/denies pain     Current Medications & Allergies (verified) Allergies as of 12/08/2018      Reactions   Pollen Extract       Medication List       Accurate as of December 08, 2018  3:32 PM. Always use your most recent med list.        calcium carbonate 1250 (500 Ca) MG tablet Commonly known as:  OS-CAL - dosed in mg of elemental calcium Take 1 tablet by mouth.   loratadine 10 MG tablet Commonly known as:  CLARITIN Take 1 tablet (10 mg total) by mouth daily.   losartan-hydrochlorothiazide 100-25 MG tablet  Commonly known as:  HYZAAR TAKE 1 TABLET EVERY DAY   pantoprazole 40 MG tablet Commonly known as:  PROTONIX Take 1 tablet (40 mg total) by mouth daily.   simvastatin 20 MG tablet Commonly known as:  ZOCOR TAKE 1 TABLET EVERY DAY   Vitamin D3 10 MCG (400 UNIT) Caps Take 1 capsule by mouth daily.       History (reviewed): Past Medical History:  Diagnosis Date  . Allergy   . GERD (gastroesophageal reflux disease)   . Hyperlipidemia   . Hypertension    Past Surgical History:  Procedure Laterality Date  . ABDOMINAL HYSTERECTOMY    . COLONOSCOPY     Family History  Problem Relation Age of Onset  . Cancer Father   . Diabetes Mother   . Heart disease Mother   . Cancer Brother   . Stroke Brother    Social History   Socioeconomic History  . Marital status: Widowed    Spouse name: Not on file  . Number of children: 1  . Years of education: Not on file  . Highest education level: 11th grade  Occupational History  . Occupation: Retired   Scientific laboratory technician  . Financial resource strain: Not hard at all  . Food insecurity:    Worry: Never true    Inability: Never true  . Transportation needs:    Medical: No    Non-medical: No  Tobacco Use  . Smoking status: Never Smoker  . Smokeless tobacco: Never Used  Substance and  Sexual Activity  . Alcohol use: No    Alcohol/week: 0.0 standard drinks  . Drug use: No  . Sexual activity: Not Currently  Lifestyle  . Physical activity:    Days per week: 0 days    Minutes per session: 0 min  . Stress: Not at all  Relationships  . Social connections:    Talks on phone: More than three times a week    Gets together: Three times a week    Attends religious service: More than 4 times per year    Active member of club or organization: Yes    Attends meetings of clubs or organizations: More than 4 times per year    Relationship status: Widowed  Other Topics Concern  . Not on file  Social History Narrative  . Not on file     Activities of Daily Living In your present state of health, do you have any difficulty performing the following activities: 12/08/2018  Hearing? N  Vision? Y  Comment wears glasses  Difficulty concentrating or making decisions? N  Walking or climbing stairs? N  Dressing or bathing? N  Doing errands, shopping? N  Preparing Food and eating ? N  Using the Toilet? N  In the past six months, have you accidently leaked urine? N  Do you have problems with loss of bowel control? N  Managing your Medications? N  Managing your Finances? N  Housekeeping or managing your Housekeeping? N  Some recent data might be hidden    Patient literacy How often do you need to have someone help you when you read instructions, pamphlets, or other written materials from your doctor or pharmacy?: 1 - Never What is the last grade level you completed in school?: 10th grade  Exercise Current Exercise Habits: Home exercise routine;The patient does not participate in regular exercise at present, Type of exercise: Other - see comments(yard work), Time (Minutes): 30, Frequency (Times/Week): 3, Weekly Exercise (Minutes/Week): 90, Intensity: Mild, Exercise limited by: orthopedic condition(s)  Diet Patient reports consuming 3 meals a day and 1 snack(s) a day Patient reports that her primary diet is: Regular Patient reports that she does have regular access to food.   Depression Screen PHQ 2/9 Scores 12/08/2018 08/17/2018 02/04/2018 10/30/2017 08/06/2017 02/07/2017 08/08/2016  PHQ - 2 Score 0 0 0 0 0 0 0     Fall Risk Fall Risk  12/08/2018 08/17/2018 02/04/2018 10/30/2017 08/06/2017  Falls in the past year? 0 0 No No No     Objective:      Last 3 BP Readings Last Weight Last BMI  BP Readings from Last 3 Encounters:  08/20/18 138/76  08/17/18 (!) 146/69  02/04/18 119/68   Wt Readings from Last 3 Encounters:  08/20/18 177 lb (80.3 kg)  08/17/18 177 lb 3.2 oz (80.4 kg)  02/04/18 179 lb 12.8 oz (81.6 kg)   BMI  Readings from Last 1 Encounters:  08/20/18 31.35 kg/m    *Unable to obtain current vital signs, weight, and BMI due to telephone visit type  Wendy Barajas seemed alert and oriented and she participated appropriately during our telephone visit.  Advanced Directives 12/08/2018 10/30/2017  Does Patient Have a Medical Advance Directive? No No  Would patient like information on creating a medical advance directive? No - Patient declined Yes (ED - Information included in AVS)    Hearing/Vision  . Fatou did not seem to have difficulty with hearing/understanding during the telephone conversation . Reports that she has not had a  formal eye exam by an eye care professional within the past year . Reports that she has not had a formal hearing evaluation within the past year *Unable to fully assess hearing and vision during telephone visit type  Cognitive Function: 6CIT Screen 12/08/2018  What Year? 0 points  What month? 0 points  What time? 0 points  Count back from 20 0 points  Months in reverse 2 points  Repeat phrase 0 points  Total Score 2    Normal Cognitive Function Screening: Yes (Normal:0-7, Significant for Dysfunction: >8)  Immunization & Health Maintenance Record Immunization History  Administered Date(s) Administered  . Influenza, High Dose Seasonal PF 06/26/2017  . Influenza,inj,Quad PF,6+ Mos 06/20/2015    Health Maintenance  Topic Date Due  . TETANUS/TDAP  07/05/1959  . PNA vac Low Risk Adult (1 of 2 - PCV13) 07/04/2005  . INFLUENZA VACCINE  03/13/2019  . DEXA SCAN  Completed       Assessment:   This is a routine wellness examination for [employer].  Health Maintenance: Due or Overdue Health Maintenance Due  Topic Date Due  . TETANUS/TDAP  07/05/1959  . PNA vac Low Risk Adult (1 of 2 - PCV13) 07/04/2005    Zarriah Mutch does not need a referral for Community Assistance: Care Management:   no Social Work:    no Prescription Assistance:  no  Nutrition/Diabetes Education:  no   Plan:    Personalized Goals Goals Addressed            This Visit's Progress   . DIET - EAT MORE FRUITS AND VEGETABLES        Personalized Health Maintenance & Screening Recommendations  Pt declines all vaccines at this time.  Lung Cancer Screening Recommended: no (Low Dose CT Chest recommended if Age 72-80 years, 30 pack-year currently smoking OR have quit w/in past 15 years) Hepatitis C Screening recommended: no HIV Screening recommended: no  Advanced Directives: Written information was not prepared per patient's request.  Referrals & Orders Placed: No orders of the defined types were placed in this encounter.   Follow-up Plan . Follow-up with Terald Sleeper, PA-C as planned  I have personally reviewed and noted the following in the patient's chart:   . Medical and social history . Use of alcohol, tobacco or illicit drugs  . Current medications and supplements . Functional ability and status . Nutritional status . Physical activity . Advanced directives . List of other physicians . Hospitalizations, surgeries, and ER visits in previous 12 months . Vitals . Screenings to include cognitive, depression, and falls . Referrals and appointments  In addition, I have reviewed and discussed with Ronie Celli certain preventive protocols, quality metrics, and best practice recommendations. A written personalized care plan for preventive services as well as general preventive health recommendations is available and can be mailed to the patient at her request.      signature  12/08/2018

## 2018-12-15 ENCOUNTER — Other Ambulatory Visit: Payer: Self-pay | Admitting: Physician Assistant

## 2018-12-15 DIAGNOSIS — E782 Mixed hyperlipidemia: Secondary | ICD-10-CM

## 2018-12-24 DIAGNOSIS — R0600 Dyspnea, unspecified: Secondary | ICD-10-CM | POA: Diagnosis not present

## 2018-12-24 DIAGNOSIS — R0609 Other forms of dyspnea: Secondary | ICD-10-CM | POA: Insufficient documentation

## 2019-01-12 ENCOUNTER — Telehealth: Payer: Self-pay | Admitting: Physician Assistant

## 2019-01-12 NOTE — Telephone Encounter (Signed)
appt made

## 2019-01-13 ENCOUNTER — Encounter: Payer: Medicare HMO | Admitting: *Deleted

## 2019-01-18 ENCOUNTER — Other Ambulatory Visit: Payer: Self-pay

## 2019-01-19 ENCOUNTER — Ambulatory Visit (INDEPENDENT_AMBULATORY_CARE_PROVIDER_SITE_OTHER): Payer: Medicare HMO | Admitting: Physician Assistant

## 2019-01-19 ENCOUNTER — Encounter: Payer: Self-pay | Admitting: Physician Assistant

## 2019-01-19 VITALS — BP 139/82 | HR 80 | Temp 97.1°F | Ht 63.0 in | Wt 174.4 lb

## 2019-01-19 DIAGNOSIS — M25562 Pain in left knee: Secondary | ICD-10-CM | POA: Diagnosis not present

## 2019-01-19 DIAGNOSIS — M25561 Pain in right knee: Secondary | ICD-10-CM

## 2019-01-19 DIAGNOSIS — G8929 Other chronic pain: Secondary | ICD-10-CM | POA: Diagnosis not present

## 2019-01-19 MED ORDER — GABAPENTIN 100 MG PO CAPS: 100.0000 mg | ORAL_CAPSULE | Freq: Every day | ORAL | 3 refills | Status: DC

## 2019-01-19 NOTE — Progress Notes (Signed)
BP 139/82   Pulse 80   Temp (!) 97.1 F (36.2 C) (Oral)   Ht 5\' 3"  (1.6 m)   Wt 174 lb 6.4 oz (79.1 kg)   BMI 30.89 kg/m    Subjective:    Patient ID: Wendy Barajas, female    DOB: 1940/12/10, 79 y.o.   MRN: [id number]  HPI: Wendy Barajas is a 78 y.o. female presenting on 01/19/2019 for Knee Pain (bilateral)  Patient comes in having significant pain in both of her knees.  Earlier in the year she was having most of the problem with her left knee.  She was seen by orthopedics and did an injection.  She states that helped for a little while but did not consistently stayed there.  So she still does have flareups of pain with this.  They did mention getting an MRI as the next plan.  So we will order an MRI.  She would like to go in the [facility] system.  She is more familiar with [place].  The right knee has begun being more consistently painful also.  She had had some problems with that many years ago.  We will have an x-ray performed as soon as our radiology tech is back in the office, hopefully next week. We have discussed the possibility of using gabapentin starting very low to see if this can help her with pain in the long run.  I let her know it will not immediately give her any relief.  It is okay for her still to take Tylenol at this time.  Past Medical History:  Diagnosis Date  . Allergy   . GERD (gastroesophageal reflux disease)   . Hyperlipidemia   . Hypertension    Relevant past medical, surgical, family and social history reviewed and updated as indicated. Interim medical history since our last visit reviewed. Allergies and medications reviewed and updated. DATA REVIEWED: CHART IN EPIC  Family History reviewed for pertinent findings.  Review of Systems  Constitutional: Negative.   HENT: Negative.   Eyes: Negative.   Respiratory: Negative.   Genitourinary: Negative.   Musculoskeletal: Positive for arthralgias and joint swelling.    Allergies as of 01/19/2019    Reactions   Pollen Extract       Medication List       Accurate as of January 19, 2019 12:15 PM. If you have any questions, ask your nurse or doctor.        calcium carbonate 1250 (500 Ca) MG tablet Commonly known as:  OS-CAL - dosed in mg of elemental calcium Take 1 tablet by mouth.   gabapentin 100 MG capsule Commonly known as:  NEURONTIN Take 1-3 capsules (100-300 mg total) by mouth at bedtime. Started by:  Terald Sleeper, PA-C   loratadine 10 MG tablet Commonly known as:  CLARITIN Take 1 tablet (10 mg total) by mouth daily.   losartan-hydrochlorothiazide 100-25 MG tablet Commonly known as:  HYZAAR TAKE 1 TABLET EVERY DAY   pantoprazole 40 MG tablet Commonly known as:  PROTONIX Take 1 tablet (40 mg total) by mouth daily.   simvastatin 20 MG tablet Commonly known as:  ZOCOR TAKE 1 TABLET EVERY DAY   Vitamin D3 10 MCG (400 UNIT) Caps Take 1 capsule by mouth daily.          Objective:    BP 139/82   Pulse 80   Temp (!) 97.1 F (36.2 C) (Oral)   Ht 5\' 3"  (1.6 m)   Wt 174 lb  6.4 oz (79.1 kg)   BMI 30.89 kg/m   Allergies  Allergen Reactions  . Pollen Extract     Wt Readings from Last 3 Encounters:  01/19/19 174 lb 6.4 oz (79.1 kg)  08/20/18 177 lb (80.3 kg)  08/17/18 177 lb 3.2 oz (80.4 kg)    Physical Exam Constitutional:      Appearance: She is well-developed.  HENT:     Head: Normocephalic and atraumatic.  Eyes:     Conjunctiva/sclera: Conjunctivae normal.     Pupils: Pupils are equal, round, and reactive to light.  Cardiovascular:     Rate and Rhythm: Normal rate and regular rhythm.     Heart sounds: Normal heart sounds.  Musculoskeletal:     Right knee: She exhibits decreased range of motion and swelling. Tenderness found.     Left knee: She exhibits decreased range of motion and swelling. Tenderness found.  Skin:    General: Skin is warm and dry.     Findings: No rash.  Neurological:     Mental Status: She is alert and oriented to person,  place, and time.     Deep Tendon Reflexes: Reflexes are normal and symmetric.  Psychiatric:        Behavior: Behavior normal.        Thought Content: Thought content normal.        Judgment: Judgment normal.         Assessment & Plan:   1. Chronic pain of both knees - gabapentin (NEURONTIN) 100 MG capsule; Take 1-3 capsules (100-300 mg total) by mouth at bedtime.  Dispense: 90 capsule; Refill: 3  We will try to reduce some of the pain by consistently taking the gabapentin.  Patient does not really want to take any narcotic pain medication.  If there are any abnormalities we will refer her back to Dr. Lorin Mercy  - DG Knee Complete 4 Views Right; Future  2. Chronic pain of left knee - MR Knee Left  Wo Contrast; Future   Continue all other maintenance medications as listed above.  Follow up plan: No follow-ups on file.  Educational handout given for [place] PA-C [place] [address]  [place], [place] [address] [phone]   01/19/2019, 12:15 PM

## 2019-02-01 DIAGNOSIS — R0789 Other chest pain: Secondary | ICD-10-CM | POA: Diagnosis not present

## 2019-02-01 DIAGNOSIS — R0600 Dyspnea, unspecified: Secondary | ICD-10-CM | POA: Diagnosis not present

## 2019-02-10 ENCOUNTER — Other Ambulatory Visit: Payer: Self-pay | Admitting: Physician Assistant

## 2019-02-10 DIAGNOSIS — M25562 Pain in left knee: Secondary | ICD-10-CM

## 2019-02-10 DIAGNOSIS — G8929 Other chronic pain: Secondary | ICD-10-CM

## 2019-02-10 NOTE — Telephone Encounter (Signed)
Please advise 

## 2019-02-12 ENCOUNTER — Other Ambulatory Visit: Payer: Self-pay

## 2019-02-12 ENCOUNTER — Ambulatory Visit (INDEPENDENT_AMBULATORY_CARE_PROVIDER_SITE_OTHER): Payer: Medicare HMO | Admitting: Family Medicine

## 2019-02-12 ENCOUNTER — Encounter: Payer: Self-pay | Admitting: Family Medicine

## 2019-02-12 DIAGNOSIS — I10 Essential (primary) hypertension: Secondary | ICD-10-CM | POA: Diagnosis not present

## 2019-02-12 DIAGNOSIS — E785 Hyperlipidemia, unspecified: Secondary | ICD-10-CM | POA: Diagnosis not present

## 2019-02-12 DIAGNOSIS — S0990XA Unspecified injury of head, initial encounter: Secondary | ICD-10-CM | POA: Diagnosis not present

## 2019-02-12 DIAGNOSIS — W0110XA Fall on same level from slipping, tripping and stumbling with subsequent striking against unspecified object, initial encounter: Secondary | ICD-10-CM | POA: Diagnosis not present

## 2019-02-12 DIAGNOSIS — Z79899 Other long term (current) drug therapy: Secondary | ICD-10-CM | POA: Diagnosis not present

## 2019-02-12 DIAGNOSIS — S0083XA Contusion of other part of head, initial encounter: Secondary | ICD-10-CM | POA: Diagnosis not present

## 2019-02-12 DIAGNOSIS — S098XXA Other specified injuries of head, initial encounter: Secondary | ICD-10-CM | POA: Diagnosis not present

## 2019-02-12 DIAGNOSIS — K219 Gastro-esophageal reflux disease without esophagitis: Secondary | ICD-10-CM | POA: Diagnosis not present

## 2019-02-12 DIAGNOSIS — S0003XA Contusion of scalp, initial encounter: Secondary | ICD-10-CM | POA: Diagnosis not present

## 2019-02-12 NOTE — Progress Notes (Signed)
    Subjective:    Patient ID: Wendy Barajas, female    DOB: 01-05-1941, 78 y.o.   MRN: [id number]   HPI: Wendy Barajas is a 78 y.o. female presenting for falling backward and hitting head on the floor. Knot on back of the head. Was cleaning cabinets. Stood on a chair to clean overhead. Chair slipped out from under her causing the fall. Now has severe HA. She denies LOC. No active bleeding.    Depression screen [facility] 2/9 01/19/2019 12/08/2018 08/17/2018 02/04/2018 10/30/2017  Decreased Interest 0 0 0 0 0  Down, Depressed, Hopeless 0 0 0 0 0  PHQ - 2 Score 0 0 0 0 0     Relevant past medical, surgical, family and social history reviewed and updated as indicated.  Interim medical history since our last visit reviewed. Allergies and medications reviewed and updated.  ROS:  Review of Systems  Constitutional: Negative.   HENT: Negative.   Eyes: Negative for visual disturbance.  Respiratory: Negative for shortness of breath.   Cardiovascular: Negative for chest pain.  Gastrointestinal: Negative for abdominal pain.  Musculoskeletal: Positive for arthralgias and myalgias.  Neurological: Positive for headaches.     Social History   Tobacco Use  Smoking Status Never Smoker  Smokeless Tobacco Never Used       Objective:     Wt Readings from Last 3 Encounters:  01/19/19 174 lb 6.4 oz (79.1 kg)  08/20/18 177 lb (80.3 kg)  08/17/18 177 lb 3.2 oz (80.4 kg)    Pt. Is lucid. Conversant, articulate. Otherwise exam deferred. Pt. Harboring due to COVID 19. Phone visit performed.   Assessment & Plan:   1. Blunt head trauma, initial encounter     Pt. Was strongly advised to seek emergency assistance due to the fall and subsequent HA. She needs to have intracranial bleed ruled out. She declined 911, but will call her son or daughter immediately.    Diagnoses and all orders for this visit:  Blunt head trauma, initial encounter    Virtual Visit via telephone Note  I discussed the  limitations, risks, security and privacy concerns of performing an evaluation and management service by telephone and the availability of in person appointments. The patient was identified with two identifiers. Pt.expressed understanding and agreed to proceed. Pt. Is at home. Dr. Livia Snellen is in his office.  Follow Up Instructions:   I discussed the assessment and treatment plan with the patient. The patient was provided an opportunity to ask questions and all were answered. The patient agreed with the plan and demonstrated an understanding of the instructions.   The patient was advised to call back or seek an in-person evaluation if the symptoms worsen or if the condition fails to improve as anticipated.   Total minutes including chart review and phone contact time: 15   Follow up plan: Return if symptoms worsen or fail to improve.  Claretta Fraise, MD [place]

## 2019-02-15 ENCOUNTER — Encounter: Payer: Self-pay | Admitting: Physician Assistant

## 2019-02-15 ENCOUNTER — Other Ambulatory Visit: Payer: Self-pay

## 2019-02-15 ENCOUNTER — Ambulatory Visit (INDEPENDENT_AMBULATORY_CARE_PROVIDER_SITE_OTHER): Payer: Medicare HMO | Admitting: Physician Assistant

## 2019-02-15 VITALS — BP 166/85 | HR 70 | Temp 97.1°F | Ht 63.0 in | Wt 176.0 lb

## 2019-02-15 DIAGNOSIS — S0990XD Unspecified injury of head, subsequent encounter: Secondary | ICD-10-CM

## 2019-02-16 NOTE — Progress Notes (Signed)
BP (!) 166/85   Pulse 70   Temp (!) 97.1 F (36.2 C) (Oral)   Ht _0  (1.6 m)   Wt 176 lb (79.8 kg)   BMI 31.18 kg/m    Subjective:    Patient ID: Wendy Barajas, female    DOB: September 29, 1940, 78 y.o.   MRN: [id number]  HPI: Wendy Barajas is a 78 y.o. female presenting on 02/15/2019 for Medical Management of Chronic Issues (Had fall Friday and seen at ER)  The scans were normal at the [facility] emergency room.  All of her visit, she is not having any neurological symptoms.  She states that she is feeling better and not having any difficulty with headache, speech, walking.  Notes were reviewed, her CT scan was normal..  She states that overall she is feeling good.  They did update her on her tetanus.   Past Medical History:  Diagnosis Date  . Allergy   . GERD (gastroesophageal reflux disease)   . Hyperlipidemia   . Hypertension    Relevant past medical, surgical, family and social history reviewed and updated as indicated. Interim medical history since our last visit reviewed. Allergies and medications reviewed and updated. DATA REVIEWED: CHART IN EPIC  Family History reviewed for pertinent findings.  Review of Systems  Allergies as of 02/15/2019      Reactions   Pollen Extract       Medication List       Accurate as of February 15, 2019 11:59 PM. If you have any questions, ask your nurse or doctor.        calcium carbonate 1250 (500 Ca) MG tablet Commonly known as: OS-CAL - dosed in mg of elemental calcium Take 1 tablet by mouth.   gabapentin 100 MG capsule Commonly known as: NEURONTIN TAKE 1-3 CAPSULES (100-300 MG TOTAL) BY MOUTH AT BEDTIME.   loratadine 10 MG tablet Commonly known as: CLARITIN Take 1 tablet (10 mg total) by mouth daily.   losartan-hydrochlorothiazide 100-25 MG tablet Commonly known as: HYZAAR TAKE 1 TABLET EVERY DAY   pantoprazole 40 MG tablet Commonly known as: PROTONIX Take 1 tablet (40 mg total) by mouth daily.   simvastatin 20 MG tablet  Commonly known as: ZOCOR TAKE 1 TABLET EVERY DAY   Vitamin D3 10 MCG (400 UNIT) Caps Take 1 capsule by mouth daily.          Objective:    BP (!) 166/85   Pulse 70   Temp (!) 97.1 F (36.2 C) (Oral)   Ht _1  (1.6 m)   Wt 176 lb (79.8 kg)   BMI 31.18 kg/m   Allergies  Allergen Reactions  . Pollen Extract     Wt Readings from Last 3 Encounters:  02/15/19 176 lb (79.8 kg)  01/19/19 174 lb 6.4 oz (79.1 kg)  08/20/18 177 lb (80.3 kg)    Physical Exam  Results for orders placed or performed in visit on 08/17/18  CBC with Differential/Platelet  Result Value Ref Range   WBC 6.9 3.4 - 10.8 x10E3/uL   RBC 3.80 3.77 - 5.28 x10E6/uL   Hemoglobin 11.6 11.1 - 15.9 g/dL   Hematocrit 34.7 34.0 - 46.6 %   MCV 91 79 - 97 fL   MCH 30.5 26.6 - 33.0 pg   MCHC 33.4 31.5 - 35.7 g/dL   RDW 13.5 11.7 - 15.4 %   Platelets 288 150 - 450 x10E3/uL   Neutrophils 57 Not Estab. %   Lymphs 30 Not Estab. %  Monocytes 7 Not Estab. %   Eos 5 Not Estab. %   Basos 1 Not Estab. %   Neutrophils Absolute 4.0 1.4 - 7.0 x10E3/uL   Lymphocytes Absolute 2.1 0.7 - 3.1 x10E3/uL   Monocytes Absolute 0.5 0.1 - 0.9 x10E3/uL   EOS (ABSOLUTE) 0.3 0.0 - 0.4 x10E3/uL   Basophils Absolute 0.0 0.0 - 0.2 x10E3/uL   Immature Granulocytes 0 Not Estab. %   Immature Grans (Abs) 0.0 0.0 - 0.1 x10E3/uL  CMP14+EGFR  Result Value Ref Range   Glucose 102 (H) 65 - 99 mg/dL   BUN 32 (H) 8 - 27 mg/dL   Creatinine, Ser 1.98 (H) 0.57 - 1.00 mg/dL   GFR calc non Af Amer 24 (L) >59 mL/min/1.73   GFR calc Af Amer 27 (L) >59 mL/min/1.73   BUN/Creatinine Ratio 16 12 - 28   Sodium 141 134 - 144 mmol/L   Potassium 4.7 3.5 - 5.2 mmol/L   Chloride 102 96 - 106 mmol/L   CO2 24 20 - 29 mmol/L   Calcium 9.7 8.7 - 10.3 mg/dL   Total Protein 7.1 6.0 - 8.5 g/dL   Albumin 4.5 3.5 - 4.8 g/dL   Globulin, Total 2.6 1.5 - 4.5 g/dL   Albumin/Globulin Ratio 1.7 1.2 - 2.2   Bilirubin Total 1.0 0.0 - 1.2 mg/dL   Alkaline Phosphatase  89 39 - 117 IU/L   AST 16 0 - 40 IU/L   ALT 5 0 - 32 IU/L  Lipid panel  Result Value Ref Range   Cholesterol, Total 159 100 - 199 mg/dL   Triglycerides 111 0 - 149 mg/dL   HDL 49 >39 mg/dL   VLDL Cholesterol Cal 22 5 - 40 mg/dL   LDL Calculated 88 0 - 99 mg/dL   Chol/HDL Ratio 3.2 0.0 - 4.4 ratio      Assessment & Plan:   1. Traumatic injury of head, subsequent encounter Call if any change in neurologic symptom symptoms    Continue all other maintenance medications as listed above.  Follow up plan: Return in about 6 months (around 08/18/2019).  Educational handout given for [place] PA-C [employer] [address]  [place], [place] [address] [phone]   02/16/2019, 2:04 PM

## 2019-02-19 ENCOUNTER — Other Ambulatory Visit: Payer: Self-pay | Admitting: Physician Assistant

## 2019-02-19 DIAGNOSIS — E782 Mixed hyperlipidemia: Secondary | ICD-10-CM

## 2019-02-24 DIAGNOSIS — R0600 Dyspnea, unspecified: Secondary | ICD-10-CM | POA: Diagnosis not present

## 2019-02-24 DIAGNOSIS — R0789 Other chest pain: Secondary | ICD-10-CM | POA: Diagnosis not present

## 2019-03-10 ENCOUNTER — Telehealth: Payer: Self-pay | Admitting: Physician Assistant

## 2019-03-10 NOTE — Chronic Care Management (AMB) (Signed)
Chronic Care Management   Note  03/10/2019 Name: Wendy Barajas MRN: [id number] DOB: 1941/08/04  Wendy Barajas is a 78 y.o. year old female who is a primary care patient of Theodoro Clock. I reached out to [employer] by phone today in response to a referral sent by [name]'s health plan.    [name] was given information about Chronic Care Management services today including:  1. CCM service includes personalized support from designated clinical staff supervised by her physician, including individualized plan of care and coordination with other care providers 2. 24/7 contact phone numbers for assistance for urgent and routine care needs. 3. Service will only be billed when office clinical staff spend 20 minutes or more in a month to coordinate care. 4. Only one practitioner may furnish and bill the service in a calendar month. 5. The patient may stop CCM services at any time (effective at the end of the month) by phone call to the office staff. 6. The patient will be responsible for cost sharing (co-pay) of up to 20% of the service fee (after annual deductible is met).  Patient agreed to services and verbal consent obtained.   Follow up plan: Telephone appointment with CCM team member scheduled for: 03/17/2019  [place]  ??[email or web address]   ??[phone]

## 2019-03-17 ENCOUNTER — Ambulatory Visit: Payer: Medicare HMO | Admitting: *Deleted

## 2019-03-17 DIAGNOSIS — I10 Essential (primary) hypertension: Secondary | ICD-10-CM

## 2019-03-17 DIAGNOSIS — E785 Hyperlipidemia, unspecified: Secondary | ICD-10-CM

## 2019-03-17 NOTE — Chronic Care Management (AMB) (Signed)
  Chronic Care Management   Outreach Note  03/17/2019 Name: Wendy Barajas MRN: [id number] DOB: December 04, 1940  Referred by: Terald Sleeper, PA-C Reason for referral : Chronic Care Management (RNCM Initial Outreach)   An unsuccessful telephone outreach was attempted today. The patient was referred to the case management team by for assistance with chronic care management and care coordination.    Chart review was performed and CCM needs identified: Fall risk prevention, chronic knee pain, and hypertension.   Follow Up Plan: The care management team will reach out to the patient again over the next 30 days.    Chong Sicilian BSN, RN-BC Embedded Chronic Care Manager [facility] [place] Management Direct Dial: [phone]

## 2019-04-07 ENCOUNTER — Other Ambulatory Visit: Payer: Self-pay | Admitting: Physician Assistant

## 2019-04-20 ENCOUNTER — Ambulatory Visit: Payer: Medicare HMO | Admitting: *Deleted

## 2019-04-20 ENCOUNTER — Telehealth: Payer: Medicare HMO

## 2019-04-20 DIAGNOSIS — E785 Hyperlipidemia, unspecified: Secondary | ICD-10-CM

## 2019-04-20 DIAGNOSIS — I10 Essential (primary) hypertension: Secondary | ICD-10-CM

## 2019-04-20 DIAGNOSIS — K219 Gastro-esophageal reflux disease without esophagitis: Secondary | ICD-10-CM

## 2019-04-20 NOTE — Chronic Care Management (AMB) (Signed)
  Chronic Care Management   RNCM Initial Outreach Note  04/20/2019 Name: Wendy Barajas MRN: [id number] DOB: 01/30/1941  Referred by: Terald Sleeper, PA-C Reason for referral : Chronic Care Management (RNCM Initial Visit)   An unsuccessful telephone outreach was attempted today. The patient was referred to the case management team by her health plan for assistance with chronic care management and care coordination.   Follow Up Plan: The care management team will reach out to the patient again over the next 30 days.    Chong Sicilian BSN, RN-BC Embedded Chronic Care Manager [facility] [place] [facility] Direct Dial: [phone]

## 2019-05-03 ENCOUNTER — Other Ambulatory Visit: Payer: Self-pay | Admitting: Physician Assistant

## 2019-05-03 ENCOUNTER — Telehealth: Payer: Self-pay | Admitting: Physician Assistant

## 2019-05-03 DIAGNOSIS — E782 Mixed hyperlipidemia: Secondary | ICD-10-CM

## 2019-05-03 NOTE — Telephone Encounter (Signed)
Patient began having knee pain last week which worsened over the weekend.  She wants to see someone who could do a knee injection.  I offered her an appointment in our first available slot on Friday, 05/07/2019 but patient declined and said she would go to urgent care.

## 2019-05-04 DIAGNOSIS — M1712 Unilateral primary osteoarthritis, left knee: Secondary | ICD-10-CM | POA: Diagnosis not present

## 2019-05-04 NOTE — Telephone Encounter (Signed)
Ronnald Ramp NTBS lipid Jan. Refill sent mail order pharmacy

## 2019-05-06 ENCOUNTER — Other Ambulatory Visit: Payer: Self-pay

## 2019-05-07 ENCOUNTER — Ambulatory Visit (INDEPENDENT_AMBULATORY_CARE_PROVIDER_SITE_OTHER): Payer: Medicare HMO | Admitting: Family Medicine

## 2019-05-07 ENCOUNTER — Encounter: Payer: Self-pay | Admitting: Family Medicine

## 2019-05-07 ENCOUNTER — Ambulatory Visit: Payer: Medicare HMO | Admitting: *Deleted

## 2019-05-07 VITALS — BP 138/73 | HR 71 | Temp 98.6°F | Ht 63.0 in | Wt 176.0 lb

## 2019-05-07 DIAGNOSIS — M25562 Pain in left knee: Secondary | ICD-10-CM

## 2019-05-07 DIAGNOSIS — I10 Essential (primary) hypertension: Secondary | ICD-10-CM

## 2019-05-07 DIAGNOSIS — N184 Chronic kidney disease, stage 4 (severe): Secondary | ICD-10-CM | POA: Diagnosis not present

## 2019-05-07 DIAGNOSIS — E785 Hyperlipidemia, unspecified: Secondary | ICD-10-CM

## 2019-05-07 DIAGNOSIS — G8929 Other chronic pain: Secondary | ICD-10-CM | POA: Diagnosis not present

## 2019-05-07 MED ORDER — METHYLPREDNISOLONE ACETATE 40 MG/ML IJ SUSP: 40.0000 mg | Freq: Once | INTRAMUSCULAR | Status: DC

## 2019-05-07 MED ORDER — METHYLPREDNISOLONE ACETATE 40 MG/ML IJ SUSP
40.0000 mg | Freq: Once | INTRAMUSCULAR | Status: AC
  Administered 2019-05-07: 40 mg via INTRA_ARTICULAR

## 2019-05-07 MED ORDER — HYDROCODONE-ACETAMINOPHEN 5-325 MG PO TABS: 1.0000 | ORAL_TABLET | Freq: Three times a day (TID) | ORAL | 0 refills | Status: DC | PRN

## 2019-05-07 NOTE — Patient Instructions (Signed)
Let your orthopedist in [place] know that we injected the left knee as well.  Knee Injection A knee injection is a procedure to get medicine into your knee joint to relieve the pain, swelling, and stiffness of arthritis. Your health care provider uses a needle to inject medicine, which may also help to lubricate and cushion your knee joint. You may need more than one injection. Tell a health care provider about:  Any allergies you have.  All medicines you are taking, including vitamins, herbs, eye drops, creams, and over-the-counter medicines.  Any problems you or family members have had with anesthetic medicines.  Any blood disorders you have.  Any surgeries you have had.  Any medical conditions you have.  Whether you are pregnant or may be pregnant. What are the risks? Generally, this is a safe procedure. However, problems may occur, including:  Infection.  Bleeding.  Symptoms that get worse.  Damage to the area around your knee.  Allergic reaction to any of the medicines.  Skin reactions from repeated injections. What happens before the procedure?  Ask your health care provider about changing or stopping your regular medicines. This is especially important if you are taking diabetes medicines or blood thinners.  Plan to have someone take you home from the hospital or clinic. What happens during the procedure?   You will sit or lie down in a position for your knee to be treated.  The skin over your kneecap will be cleaned with a germ-killing soap.  You will be given a medicine that numbs the area (local anesthetic). You may feel some stinging.  The medicine will be injected into your knee. The needle is carefully placed between your kneecap and your knee. The medicine is injected into the joint space.  The needle will be removed at the end of the procedure.  A bandage (dressing) may be placed over the injection site. The procedure may vary among health care  providers and hospitals. What can I expect after the procedure?  Your blood pressure, heart rate, breathing rate, and blood oxygen level will be monitored until you leave the hospital or clinic.  You may have to move your knee through its full range of motion. This helps to get all the medicine into your joint space.  You will be watched to make sure that you do not have a reaction to the injected medicine.  You may feel more pain, swelling, and warmth than you did before the injection. This reaction may last about 1-2 days. Follow these instructions at home: Medicines  Take over-the-counter and prescription medicines only as told by your doctor.  Do not drive or use heavy machinery while taking prescription pain medicine.  Do not take medicines such as aspirin and ibuprofen unless your health care provider tells you to take them. Injection site care  Follow instructions from your health care provider about: ? How to take care of your puncture site. ? When and how you should change your dressing. ? When you should remove your dressing.  Check your injection area every day for signs of infection. Check for: ? More redness, swelling, or pain after 2 days. ? Fluid or blood. ? Pus or a bad smell. ? Warmth. Managing pain, stiffness, and swelling   If directed, put ice on the injection area: ? Put ice in a plastic bag. ? Place a towel between your skin and the bag. ? Leave the ice on for 20 minutes, 2-3 times per day.  Do  not apply heat to your knee.  Raise (elevate) the injection area above the level of your heart while you are sitting or lying down. General instructions  If you were given a dressing, keep it dry until your health care provider says it can be removed. Ask your health care provider when you can start showering or taking a bath.  Avoid strenuous activities for as long as directed by your health care provider. Ask your health care provider when you can return to  your normal activities.  Keep all follow-up visits as told by your health care provider. This is important. You may need more injections. Contact a health care provider if you have:  A fever.  Warmth in your injection area.  Fluid, blood, or pus coming from your injection site.  Symptoms at your injection site that last longer than 2 days after your procedure. Get help right away if:  Your knee: ? Turns very red. ? Becomes very swollen. ? Is in severe pain. Summary  A knee injection is a procedure to get medicine into your knee joint to relieve the pain, swelling, and stiffness of arthritis.  A needle is carefully placed between your kneecap and your knee to inject medicine into the joint space.  Before the procedure, ask your health care provider about changing or stopping your regular medicines, especially if you are taking diabetes medicines or blood thinners.  Contact your health care provider if you have any problems or questions after your procedure. This information is not intended to replace advice given to you by your health care provider. Make sure you discuss any questions you have with your health care provider. Document Released: 10/20/2006 Document Revised: 08/18/2017 Document Reviewed: 08/18/2017 Elsevier Patient Education  2020 [employer].

## 2019-05-07 NOTE — Progress Notes (Signed)
Subjective: CC: Left knee pain PCP: Terald Sleeper, PA-C [id number] Wendy Barajas is a 78 y.o. female presenting to clinic today for:  1.  Left knee pain Patient reports acute on chronic onset of left-sided knee pain a few days ago.  She is had chronic left-sided knee pain and told that she has arthritis in the knee previously.  There were plans for MRI of the knee but she is still waiting scheduling for this.  She sees orthopedics in [place] and recently had a corticosteroid injection to the right knee a few weeks ago.  She was seen in the urgent care 2 days ago for the left knee pain and was prescribed topical Voltaren gel.  She had x-rays which showed arthritic changes.  She is here because the gel and Tylenol are not helping.  No preceding injury.  She is having difficulty with walking.   ROS: Per HPI  Allergies  Allergen Reactions  . Pollen Extract    Past Medical History:  Diagnosis Date  . Allergy   . GERD (gastroesophageal reflux disease)   . Hyperlipidemia   . Hypertension     Current Outpatient Medications:  .  calcium carbonate (OS-CAL - DOSED IN MG OF ELEMENTAL CALCIUM) 1250 (500 Ca) MG tablet, Take 1 tablet by mouth., Disp: , Rfl:  .  Cholecalciferol (VITAMIN D3) 400 UNITS CAPS, Take 1 capsule by mouth daily. , Disp: , Rfl:  .  diclofenac sodium (VOLTAREN) 1 % GEL, Place onto the skin., Disp: , Rfl:  .  loratadine (CLARITIN) 10 MG tablet, Take 1 tablet (10 mg total) by mouth daily., Disp: 90 tablet, Rfl: 3 .  losartan-hydrochlorothiazide (HYZAAR) 100-25 MG tablet, Take 1 tablet by mouth daily. (Needs to be seen before next refill), Disp: 90 tablet, Rfl: 0 .  pantoprazole (PROTONIX) 40 MG tablet, Take 1 tablet (40 mg total) by mouth daily., Disp: 90 tablet, Rfl: 3 .  simvastatin (ZOCOR) 20 MG tablet, TAKE 1 TABLET EVERY DAY, Disp: 90 tablet, Rfl: 0 .  gabapentin (NEURONTIN) 100 MG capsule, TAKE 1-3 CAPSULES (100-300 MG TOTAL) BY MOUTH AT BEDTIME. (Patient not taking: Reported on  05/07/2019), Disp: 270 capsule, Rfl: 2 .  HYDROcodone-acetaminophen (NORCO) 5-325 MG tablet, Take 1 tablet by mouth every 8 (eight) hours as needed for severe pain., Disp: 10 tablet, Rfl: 0 Social History   Socioeconomic History  . Marital status: Widowed    Spouse name: Not on file  . Number of children: 1  . Years of education: Not on file  . Highest education level: 11th grade  Occupational History  . Occupation: Retired   Scientific laboratory technician  . Financial resource strain: Not hard at all  . Food insecurity    Worry: Never true    Inability: Never true  . Transportation needs    Medical: No    Non-medical: No  Tobacco Use  . Smoking status: Never Smoker  . Smokeless tobacco: Never Used  Substance and Sexual Activity  . Alcohol use: No    Alcohol/week: 0.0 standard drinks  . Drug use: No  . Sexual activity: Not Currently  Lifestyle  . Physical activity    Days per week: 0 days    Minutes per session: 0 min  . Stress: Not at all  Relationships  . Social connections    Talks on phone: More than three times a week    Gets together: Three times a week    Attends religious service: More than 4 times per year  Active member of club or organization: Yes    Attends meetings of clubs or organizations: More than 4 times per year    Relationship status: Widowed  . Intimate partner violence    Fear of current or ex partner: Patient refused    Emotionally abused: Patient refused    Physically abused: Patient refused    Forced sexual activity: Patient refused  Other Topics Concern  . Not on file  Social History Narrative  . Not on file   Family History  Problem Relation Age of Onset  . Cancer Father   . Diabetes Mother   . Heart disease Mother   . Cancer Brother   . Stroke Brother     Objective: Office vital signs reviewed. BP 138/73   Pulse 71   Temp 98.6 F (37 C) (Temporal)   Ht 5\' 3"  (1.6 m)   Wt 176 lb (79.8 kg)   BMI 31.18 kg/m   Physical Examination:   General: Awake, alert, No acute distress Extremities: warm, well perfused, No edema, cyanosis or clubbing; +2 pulses bilaterally MSK: antlagic gait and station  Left knee: No gross soft tissue swelling, appreciable joint effusions, erythema or increased warmth.  Patient has pain with range of motion testing.  No tenderness to palpation to the patella, patellar tendon, quad tendon.  No ligamentous laxity. Skin: dry; intact; no rashes or lesions  JOINT INJECTION:  Patient denies allergy to antiseptics (including iodine) and anesthetics.  Patient w/ no h/o diabetes, frequent steroid use, use of blood thinners/ antiplatelets.  Patient was given informed consent and a signed copy has been placed in the chart. Appropriate time out was taken. Area prepped and draped in usual sterile fashion. Anatomic landmarks were identified and injection site was marked.  Ethyl chloride spray was used to numb the area and 1 cc of methylprednisolone 40 mg/ml plus  3 cc of 1% lidocaine without epinephrine was injected into the left knee using a(n) anteriolateral approach. The patient tolerated the procedure well and there were no immediate complications. Estimated blood loss is less than 1 cc.  Post procedure instructions were reviewed and handout outlining these instructions were provided to patient.   Assessment/ Plan: 78 y.o. female   1. Chronic pain of left knee I reviewed her urgent care note but unfortunate was unable to see the results of the x-ray.  Patient with CKD 4 based on January labs.  I have agreed with avoiding NSAIDs totally.  Okay to continue topical Voltaren gel.  She was treated with a intracortical steroid injection today.  She tolerated procedure without difficulty.  Home care instructions reviewed and reasons for return discussed.  Additionally, I have given her a small quantity of Norco to have on hand for severe pain.  Caution sedation.  National narcotic database reviewed and there were no red  flags.  She will follow-up PCP for ongoing needs including scheduling of MRI of the knee. - HYDROcodone-acetaminophen (NORCO) 5-325 MG tablet; Take 1 tablet by mouth every 8 (eight) hours as needed for severe pain.  Dispense: 10 tablet; Refill: 0  2. Stage 4 chronic kidney disease ([place])   No orders of the defined types were placed in this encounter.  Meds ordered this encounter  Medications  . HYDROcodone-acetaminophen (NORCO) 5-325 MG tablet    Sig: Take 1 tablet by mouth every 8 (eight) hours as needed for severe pain.    Dispense:  10 tablet    Refill:  0     Nakira Litzau  [place], [place] [phone]

## 2019-05-07 NOTE — Addendum Note (Signed)
Addended byCarrolyn Leigh on: 05/07/2019 11:53 AM   Modules accepted: Orders

## 2019-05-17 ENCOUNTER — Ambulatory Visit: Payer: Medicare HMO | Admitting: Physician Assistant

## 2019-06-21 ENCOUNTER — Other Ambulatory Visit: Payer: Self-pay | Admitting: Physician Assistant

## 2019-06-22 MED ORDER — LOSARTAN POTASSIUM-HCTZ 100-25 MG PO TABS: 1.0000 | ORAL_TABLET | Freq: Every day | ORAL | 0 refills | Status: DC

## 2019-06-22 NOTE — Addendum Note (Signed)
Addended by: Zannie Cove on: 06/22/2019 10:28 AM   Modules accepted: Orders

## 2019-06-22 NOTE — Telephone Encounter (Signed)
[facility]. NTBS RFd 04/08/19 w/ NTBS. Mail order not sent

## 2019-08-30 ENCOUNTER — Other Ambulatory Visit: Payer: Self-pay

## 2019-08-31 ENCOUNTER — Encounter: Payer: Self-pay | Admitting: Physician Assistant

## 2019-08-31 ENCOUNTER — Ambulatory Visit (INDEPENDENT_AMBULATORY_CARE_PROVIDER_SITE_OTHER): Payer: Medicare HMO | Admitting: Physician Assistant

## 2019-08-31 VITALS — BP 119/72 | HR 72 | Temp 96.0°F | Ht 63.0 in | Wt 172.8 lb

## 2019-08-31 DIAGNOSIS — K219 Gastro-esophageal reflux disease without esophagitis: Secondary | ICD-10-CM

## 2019-08-31 DIAGNOSIS — I10 Essential (primary) hypertension: Secondary | ICD-10-CM | POA: Diagnosis not present

## 2019-08-31 DIAGNOSIS — Z0000 Encounter for general adult medical examination without abnormal findings: Secondary | ICD-10-CM | POA: Diagnosis not present

## 2019-08-31 DIAGNOSIS — E785 Hyperlipidemia, unspecified: Secondary | ICD-10-CM

## 2019-08-31 MED ORDER — RABEPRAZOLE SODIUM 20 MG PO TBEC: 20.0000 mg | DELAYED_RELEASE_TABLET | Freq: Two times a day (BID) | ORAL | 0 refills | Status: DC

## 2019-09-01 LAB — CBC WITH DIFFERENTIAL/PLATELET
Basophils Absolute: 0 10*3/uL (ref 0.0–0.2)
Basos: 1 %
EOS (ABSOLUTE): 0.3 10*3/uL (ref 0.0–0.4)
Eos: 4 %
Hematocrit: 36.7 % (ref 34.0–46.6)
Hemoglobin: 12.6 g/dL (ref 11.1–15.9)
Immature Grans (Abs): 0 10*3/uL (ref 0.0–0.1)
Immature Granulocytes: 0 %
Lymphocytes Absolute: 2.5 10*3/uL (ref 0.7–3.1)
Lymphs: 35 %
MCH: 31.6 pg (ref 26.6–33.0)
MCHC: 34.3 g/dL (ref 31.5–35.7)
MCV: 92 fL (ref 79–97)
Monocytes Absolute: 0.5 10*3/uL (ref 0.1–0.9)
Monocytes: 7 %
Neutrophils Absolute: 3.8 10*3/uL (ref 1.4–7.0)
Neutrophils: 53 %
Platelets: 279 10*3/uL (ref 150–450)
RBC: 3.99 x10E6/uL (ref 3.77–5.28)
RDW: 12.5 % (ref 11.7–15.4)
WBC: 7.1 10*3/uL (ref 3.4–10.8)

## 2019-09-01 LAB — CMP14+EGFR
ALT: 11 IU/L (ref 0–32)
AST: 19 IU/L (ref 0–40)
Albumin/Globulin Ratio: 1.6 (ref 1.2–2.2)
Albumin: 4.6 g/dL (ref 3.7–4.7)
Alkaline Phosphatase: 95 IU/L (ref 39–117)
BUN/Creatinine Ratio: 15 (ref 12–28)
BUN: 24 mg/dL (ref 8–27)
Bilirubin Total: 1.1 mg/dL (ref 0.0–1.2)
CO2: 26 mmol/L (ref 20–29)
Calcium: 9.8 mg/dL (ref 8.7–10.3)
Chloride: 101 mmol/L (ref 96–106)
Creatinine, Ser: 1.57 mg/dL — ABNORMAL HIGH (ref 0.57–1.00)
GFR calc Af Amer: 36 mL/min/{1.73_m2} — ABNORMAL LOW (ref >59)
GFR calc non Af Amer: 31 mL/min/{1.73_m2} — ABNORMAL LOW (ref >59)
Globulin, Total: 2.8 g/dL (ref 1.5–4.5)
Glucose: 96 mg/dL (ref 65–99)
Potassium: 4.3 mmol/L (ref 3.5–5.2)
Sodium: 142 mmol/L (ref 134–144)
Total Protein: 7.4 g/dL (ref 6.0–8.5)

## 2019-09-01 LAB — LIPID PANEL
Chol/HDL Ratio: 3 ratio (ref 0.0–4.4)
Cholesterol, Total: 140 mg/dL (ref 100–199)
HDL: 47 mg/dL (ref >39)
LDL Chol Calc (NIH): 69 mg/dL (ref 0–99)
Triglycerides: 138 mg/dL (ref 0–149)
VLDL Cholesterol Cal: 24 mg/dL (ref 5–40)

## 2019-09-02 NOTE — Progress Notes (Signed)
Subjective:    Patient ID: Wendy Barajas, female    DOB: 1941-04-01, 79 y.o.   MRN: [id number]  Chief Complaint  Patient presents with  . Medical Management of Chronic Issues   Patient is in today for well check, also she will be due labs.  And also her chronic medical conditions that do include hypertension, GERD, hyperlipidemia.  All of her medications are reviewed and will be refilled.  Gastroesophageal Reflux She complains of abdominal pain, dysphagia and heartburn. She reports no chest pain, no coughing or no nausea. This is a chronic problem. The current episode started more than 1 year ago. The problem occurs frequently. The problem has been gradually worsening. The heartburn duration is several minutes. The heartburn is of moderate intensity. The symptoms are aggravated by certain foods and bending. Pertinent negatives include no fatigue. She has tried a PPI for the symptoms. The treatment provided mild relief.  Hypertension This is a chronic problem. The current episode started more than 1 year ago. The problem is controlled. Pertinent negatives include no chest pain. The current treatment provides significant improvement. There are no compliance problems.   Hyperlipidemia This is a chronic problem. The current episode started more than 1 year ago. The problem is controlled. Pertinent negatives include no chest pain. Current antihyperlipidemic treatment includes statins.     Past Medical History:  Diagnosis Date  . Allergy   . GERD (gastroesophageal reflux disease)   . Hyperlipidemia   . Hypertension     Past Surgical History:  Procedure Laterality Date  . ABDOMINAL HYSTERECTOMY    . COLONOSCOPY      Family History  Problem Relation Age of Onset  . Cancer Father   . Diabetes Mother   . Heart disease Mother   . Cancer Brother   . Stroke Brother     Social History   Socioeconomic History  . Marital status: Widowed    Spouse name: Not on file  . Number of  children: 1  . Years of education: Not on file  . Highest education level: 11th grade  Occupational History  . Occupation: Retired   Tobacco Use  . Smoking status: Never Smoker  . Smokeless tobacco: Never Used  Substance and Sexual Activity  . Alcohol use: No    Alcohol/week: 0.0 standard drinks  . Drug use: No  . Sexual activity: Not Currently  Other Topics Concern  . Not on file  Social History Narrative  . Not on file   Social Determinants of Health   Financial Resource Strain:   . Difficulty of Paying Living Expenses: Not on file  Food Insecurity:   . Worried About Charity fundraiser in the Last Year: Not on file  . Ran Out of Food in the Last Year: Not on file  Transportation Needs:   . Lack of Transportation (Medical): Not on file  . Lack of Transportation (Non-Medical): Not on file  Physical Activity:   . Days of Exercise per Week: Not on file  . Minutes of Exercise per Session: Not on file  Stress:   . Feeling of Stress : Not on file  Social Connections:   . Frequency of Communication with Friends and Family: Not on file  . Frequency of Social Gatherings with Friends and Family: Not on file  . Attends Religious Services: Not on file  . Active Member of Clubs or Organizations: Not on file  . Attends Archivist Meetings: Not on file  .  Marital Status: Not on file  Intimate Partner Violence:   . Fear of Current or Ex-Partner: Not on file  . Emotionally Abused: Not on file  . Physically Abused: Not on file  . Sexually Abused: Not on file    Outpatient Medications Prior to Visit  Medication Sig Dispense Refill  . calcium carbonate (OS-CAL - DOSED IN MG OF ELEMENTAL CALCIUM) 1250 (500 Ca) MG tablet Take 1 tablet by mouth.    . Cholecalciferol (VITAMIN D3) 400 UNITS CAPS Take 1 capsule by mouth daily.     Marland Kitchen HYDROcodone-acetaminophen (NORCO) 5-325 MG tablet Take 1 tablet by mouth every 8 (eight) hours as needed for severe pain. 10 tablet 0  . loratadine  (CLARITIN) 10 MG tablet Take 1 tablet (10 mg total) by mouth daily. 90 tablet 3  . losartan-hydrochlorothiazide (HYZAAR) 100-25 MG tablet Take 1 tablet by mouth daily. (Needs to be seen before next refill) 90 tablet 0  . simvastatin (ZOCOR) 20 MG tablet TAKE 1 TABLET EVERY DAY 90 tablet 0  . gabapentin (NEURONTIN) 100 MG capsule TAKE 1-3 CAPSULES (100-300 MG TOTAL) BY MOUTH AT BEDTIME. 270 capsule 2  . pantoprazole (PROTONIX) 40 MG tablet Take 1 tablet (40 mg total) by mouth daily. 90 tablet 3   No facility-administered medications prior to visit.    Allergies  Allergen Reactions  . Pollen Extract     Review of Systems  Constitutional: Negative.  Negative for activity change, fatigue and fever.  HENT: Negative.   Eyes: Negative.   Respiratory: Negative.  Negative for cough.   Cardiovascular: Negative.  Negative for chest pain.  Gastrointestinal: Positive for abdominal distention, abdominal pain, dysphagia and heartburn. Negative for nausea, rectal pain and vomiting.  Endocrine: Negative.   Genitourinary: Negative.  Negative for dysuria.  Musculoskeletal: Negative.   Skin: Negative.   Neurological: Negative.        Objective:    Physical Exam Constitutional:      Appearance: She is well-developed.  HENT:     Head: Normocephalic and atraumatic.     Right Ear: Tympanic membrane, ear canal and external ear normal.     Left Ear: Tympanic membrane, ear canal and external ear normal.     Nose: Nose normal. No rhinorrhea.     Mouth/Throat:     Pharynx: No oropharyngeal exudate or posterior oropharyngeal erythema.  Eyes:     Conjunctiva/sclera: Conjunctivae normal.     Pupils: Pupils are equal, round, and reactive to light.  Cardiovascular:     Rate and Rhythm: Normal rate and regular rhythm.     Heart sounds: Normal heart sounds.  Pulmonary:     Effort: Pulmonary effort is normal.     Breath sounds: Normal breath sounds.  Abdominal:     General: Bowel sounds are normal.      Palpations: Abdomen is soft.  Musculoskeletal:     Cervical back: Normal range of motion and neck supple.  Skin:    General: Skin is warm and dry.     Findings: No rash.  Neurological:     Mental Status: She is alert and oriented to person, place, and time.     Deep Tendon Reflexes: Reflexes are normal and symmetric.  Psychiatric:        Behavior: Behavior normal.        Thought Content: Thought content normal.        Judgment: Judgment normal.     BP 119/72   Pulse 72   Temp Marland Kitchen)  96 F (35.6 C) (Temporal)   Ht '5\' 3"'$  (1.6 m)   Wt 172 lb 12.8 oz (78.4 kg)   BMI 30.61 kg/m  Wt Readings from Last 3 Encounters:  08/31/19 172 lb 12.8 oz (78.4 kg)  05/07/19 176 lb (79.8 kg)  02/15/19 176 lb (79.8 kg)    Health Maintenance Due  Topic Date Due  . PNA vac Low Risk Adult (1 of 2 - PCV13) 07/04/2005  . INFLUENZA VACCINE  03/13/2019    There are no preventive care reminders to display for this patient.   Lab Results  Component Value Date   TSH 1.730 02/14/2015   Lab Results  Component Value Date   WBC 7.1 08/31/2019   HGB 12.6 08/31/2019   HCT 36.7 08/31/2019   MCV 92 08/31/2019   PLT 279 08/31/2019   Lab Results  Component Value Date   NA 142 08/31/2019   K 4.3 08/31/2019   CO2 26 08/31/2019   GLUCOSE 96 08/31/2019   BUN 24 08/31/2019   CREATININE 1.57 (H) 08/31/2019   BILITOT 1.1 08/31/2019   ALKPHOS 95 08/31/2019   AST 19 08/31/2019   ALT 11 08/31/2019   PROT 7.4 08/31/2019   ALBUMIN 4.6 08/31/2019   CALCIUM 9.8 08/31/2019   Lab Results  Component Value Date   CHOL 140 08/31/2019   Lab Results  Component Value Date   HDL 47 08/31/2019   Lab Results  Component Value Date   LDLCALC 69 08/31/2019   Lab Results  Component Value Date   TRIG 138 08/31/2019   Lab Results  Component Value Date   CHOLHDL 3.0 08/31/2019   Lab Results  Component Value Date   HGBA1C 6.0 02/07/2017       Assessment & Plan:   Problem List Items Addressed This  Visit      Cardiovascular and Mediastinum   Essential hypertension     Digestive   Gastroesophageal reflux disease without esophagitis   Relevant Medications   RABEprazole (ACIPHEX) 20 MG tablet     Other   Hyperlipidemia (Chronic)   Well adult exam - Primary   Relevant Orders   CBC with Differential/Platelet (Completed)   CMP14+EGFR (Completed)   Lipid panel (Completed)       Meds ordered this encounter  Medications  . RABEprazole (ACIPHEX) 20 MG tablet    Sig: Take 1 tablet (20 mg total) by mouth 2 (two) times daily.    Dispense:  180 tablet    Refill:  0    Order Specific Question:   Supervising Provider    Answer:   [name] [[id number]]     Terald Sleeper, PA-C

## 2019-09-02 NOTE — Addendum Note (Signed)
Addended by: Terald Sleeper on: 09/02/2019 05:17 PM   Modules accepted: Orders

## 2019-09-06 ENCOUNTER — Other Ambulatory Visit: Payer: Self-pay | Admitting: Physician Assistant

## 2019-09-06 ENCOUNTER — Telehealth: Payer: Self-pay | Admitting: *Deleted

## 2019-09-06 DIAGNOSIS — K219 Gastro-esophageal reflux disease without esophagitis: Secondary | ICD-10-CM

## 2019-09-06 MED ORDER — RABEPRAZOLE SODIUM 20 MG PO TBEC: 20.0000 mg | DELAYED_RELEASE_TABLET | Freq: Every day | ORAL | 1 refills | Status: DC

## 2019-09-06 NOTE — Telephone Encounter (Signed)
Rabeprazole Sodium 20mg  DR tab-Exceeds Maximum Dispensing Limit

## 2019-09-06 NOTE — Telephone Encounter (Signed)
Please let the patient know that her insurance does not allow twice daily of that particular medicine.  This is why I definitely want her to go to the gastroenterology appointment that we are trying to make for her.

## 2019-09-07 NOTE — Telephone Encounter (Signed)
Pt aware insurance won't cover for her to take twice daily and she said she has her appt scheduled with GI and will keep it.

## 2019-10-18 DIAGNOSIS — R0789 Other chest pain: Secondary | ICD-10-CM | POA: Diagnosis not present

## 2019-10-18 DIAGNOSIS — K449 Diaphragmatic hernia without obstruction or gangrene: Secondary | ICD-10-CM | POA: Diagnosis not present

## 2019-10-18 DIAGNOSIS — K219 Gastro-esophageal reflux disease without esophagitis: Secondary | ICD-10-CM | POA: Diagnosis not present

## 2019-10-18 DIAGNOSIS — Z8619 Personal history of other infectious and parasitic diseases: Secondary | ICD-10-CM | POA: Diagnosis not present

## 2019-10-18 DIAGNOSIS — K222 Esophageal obstruction: Secondary | ICD-10-CM | POA: Diagnosis not present

## 2019-10-29 ENCOUNTER — Other Ambulatory Visit: Payer: Self-pay | Admitting: *Deleted

## 2019-10-29 DIAGNOSIS — K219 Gastro-esophageal reflux disease without esophagitis: Secondary | ICD-10-CM

## 2019-10-29 MED ORDER — RABEPRAZOLE SODIUM 20 MG PO TBEC: 20.0000 mg | DELAYED_RELEASE_TABLET | Freq: Every day | ORAL | 0 refills | Status: DC

## 2019-10-29 MED ORDER — LORATADINE 10 MG PO TABS: 10.0000 mg | ORAL_TABLET | Freq: Every day | ORAL | 0 refills | Status: DC

## 2019-11-05 DIAGNOSIS — K225 Diverticulum of esophagus, acquired: Secondary | ICD-10-CM | POA: Diagnosis not present

## 2019-11-05 DIAGNOSIS — K219 Gastro-esophageal reflux disease without esophagitis: Secondary | ICD-10-CM | POA: Diagnosis not present

## 2019-11-05 DIAGNOSIS — K449 Diaphragmatic hernia without obstruction or gangrene: Secondary | ICD-10-CM | POA: Diagnosis not present

## 2019-11-05 DIAGNOSIS — R0789 Other chest pain: Secondary | ICD-10-CM | POA: Diagnosis not present

## 2019-12-09 ENCOUNTER — Ambulatory Visit (INDEPENDENT_AMBULATORY_CARE_PROVIDER_SITE_OTHER): Payer: Medicare HMO | Admitting: *Deleted

## 2019-12-09 VITALS — BP 119/72 | Wt 172.0 lb

## 2019-12-09 DIAGNOSIS — Z0000 Encounter for general adult medical examination without abnormal findings: Secondary | ICD-10-CM

## 2019-12-09 NOTE — Progress Notes (Signed)
MEDICARE ANNUAL WELLNESS VISIT  12/09/2019  Telephone Visit Disclaimer This Medicare AWV was conducted by telephone due to national recommendations for restrictions regarding the COVID-19 Pandemic (e.g. social distancing).  I verified, using two identifiers, that I am speaking with Wendy Barajas or their authorized healthcare agent. I discussed the limitations, risks, security, and privacy concerns of performing an evaluation and management service by telephone and the potential availability of an in-person appointment in the future. The patient expressed understanding and agreed to proceed.   Subjective:  Wendy Barajas is a 79 y.o. female patient of [name], DO who had a Medicare Annual Wellness Visit today via telephone. Komal is Retired and lives alone. she has 1 child. she reports that she is socially active and does interact with friends/family regularly. she is minimally physically active and enjoys gardening.  Patient Care Team: [name], DO as PCP - General (Family Medicine) Ilean China, RN as Registered Nurse  Advanced Directives 12/09/2019 12/08/2018 10/30/2017  Does Patient Have a Medical Advance Directive? No No No  Would patient like information on creating a medical advance directive? No - Patient declined No - Patient declined Yes (ED - Information included in AVS)    Hospital Utilization Over the Past 12 Months: # of hospitalizations or ER visits: 0 # of surgeries: 0  Review of Systems    Patient reports that her overall health is worse compared to last year, due to knee pain.  General ROS: negative  Patient Reported Readings (BP, Pulse, CBG, Weight, etc) BP 119/72   Wt 172 lb (78 kg)   BMI 30.47 kg/m    Pain Assessment       Current Medications & Allergies (verified) Allergies as of 12/09/2019      Reactions   Pollen Extract       Medication List       Accurate as of December 09, 2019  3:17 PM. If you have any questions,  ask your nurse or doctor.        STOP taking these medications   HYDROcodone-acetaminophen 5-325 MG tablet Commonly known as: Norco     TAKE these medications   calcium carbonate 1250 (500 Ca) MG tablet Commonly known as: OS-CAL - dosed in mg of elemental calcium Take 1 tablet by mouth.   loratadine 10 MG tablet Commonly known as: CLARITIN Take 10 mg by mouth daily. What changed: Another medication with the same name was removed. Continue taking this medication, and follow the directions you see here.   losartan-hydrochlorothiazide 100-25 MG tablet Commonly known as: HYZAAR Take 1 tablet by mouth daily. (Needs to be seen before next refill)   RABEprazole 20 MG tablet Commonly known as: Aciphex Take 1 tablet (20 mg total) by mouth daily.   simvastatin 20 MG tablet Commonly known as: ZOCOR TAKE 1 TABLET EVERY DAY   Vitamin D3 10 MCG (400 UNIT) Caps Take 1 capsule by mouth daily.       History (reviewed): Past Medical History:  Diagnosis Date  . Allergy   . Arthritis    bone spurs - bilateral knee issues   . GERD (gastroesophageal reflux disease)   . Hyperlipidemia   . Hypertension    Past Surgical History:  Procedure Laterality Date  . ABDOMINAL HYSTERECTOMY    . COLONOSCOPY    . EYE SURGERY Bilateral    cataract removal    Family History  Problem Relation Age of Onset  . Cancer Father  unsure = thinks colon   . Diabetes Mother   . Heart disease Mother   . Cancer Brother        unsure   . Stroke Brother   . Arthritis Sister        back issues    Social History   Socioeconomic History  . Marital status: Widowed    Spouse name: Not on file  . Number of children: 1  . Years of education: Not on file  . Highest education level: 11th grade  Occupational History  . Occupation: Retired   Tobacco Use  . Smoking status: Never Smoker  . Smokeless tobacco: Never Used  Substance and Sexual Activity  . Alcohol use: No    Alcohol/week: 0.0  standard drinks  . Drug use: No  . Sexual activity: Not Currently  Other Topics Concern  . Not on file  Social History Narrative   Live alone    Social Determinants of Health   Financial Resource Strain:   . Difficulty of Paying Living Expenses:   Food Insecurity:   . Worried About Charity fundraiser in the Last Year:   . Arboriculturist in the Last Year:   Transportation Needs:   . Film/video editor (Medical):   Marland Kitchen Lack of Transportation (Non-Medical):   Physical Activity:   . Days of Exercise per Week:   . Minutes of Exercise per Session:   Stress:   . Feeling of Stress :   Social Connections:   . Frequency of Communication with Friends and Family:   . Frequency of Social Gatherings with Friends and Family:   . Attends Religious Services:   . Active Member of Clubs or Organizations:   . Attends Archivist Meetings:   Marland Kitchen Marital Status:     Activities of Daily Living In your present state of health, do you have any difficulty performing the following activities: 12/09/2019  Hearing? N  Vision? Y  Comment rx glasses - reading  Difficulty concentrating or making decisions? N  Walking or climbing stairs? Y  Comment knees  Dressing or bathing? N  Doing errands, shopping? Y  Preparing Food and eating ? N  Using the Toilet? N  In the past six months, have you accidently leaked urine? N  Do you have problems with loss of bowel control? N  Managing your Medications? N  Managing your Finances? N  Housekeeping or managing your Housekeeping? N  Some recent data might be hidden    Patient Education/ Literacy    Exercise Current Exercise Habits: Home exercise routine, Type of exercise: walking, Time (Minutes): 30, Frequency (Times/Week): 3, Weekly Exercise (Minutes/Week): 90, Intensity: Mild, Exercise limited by: orthopedic condition(s)  Diet Patient reports consuming 2 meals a day and 1 snack(s) a day Patient reports that her primary diet is:  Regular Patient reports that she does have regular access to food.   Depression Screen PHQ 2/9 Scores 12/09/2019 08/31/2019 05/07/2019 02/15/2019 01/19/2019 12/08/2018 08/17/2018  PHQ - 2 Score 0 0 0 0 0 0 0  PHQ- 9 Score - - 0 - - - -     Fall Risk Fall Risk  12/09/2019 08/31/2019 05/07/2019 02/15/2019 01/19/2019  Falls in the past year? 0 1 1 1  0  Number falls in past yr: - 0 0 0 -  Injury with Fall? - 1 0 0 -  Follow up - Falls prevention discussed - - -     Objective:  Ceilidh Fleeger seemed  alert and oriented and she participated appropriately during our telephone visit.  Blood Pressure Weight BMI  BP Readings from Last 3 Encounters:  12/09/19 119/72  08/31/19 119/72  05/07/19 138/73   Wt Readings from Last 3 Encounters:  12/09/19 172 lb (78 kg)  08/31/19 172 lb 12.8 oz (78.4 kg)  05/07/19 176 lb (79.8 kg)   BMI Readings from Last 1 Encounters:  12/09/19 30.47 kg/m    *Unable to obtain current vital signs, weight, and BMI due to telephone visit type  Hearing/Vision  . Daphnie did not seem to have difficulty with hearing/understanding during the telephone conversation . Reports that she has had a formal eye exam by an eye care professional within the past year . Reports that she has not had a formal hearing evaluation within the past year *Unable to fully assess hearing and vision during telephone visit type  Cognitive Function: 6CIT Screen 12/09/2019 12/08/2018  What Year? 0 points 0 points  What month? 0 points 0 points  What time? 0 points 0 points  Count back from 20 0 points 0 points  Months in reverse 0 points 2 points  Repeat phrase 0 points 0 points  Total Score 0 2   (Normal:0-7, Significant for Dysfunction: >8)  Normal Cognitive Function Screening: Yes   Immunization & Health Maintenance Record Immunization History  Administered Date(s) Administered  . Influenza, High Dose Seasonal PF 06/26/2017  . Influenza,inj,Quad PF,6+ Mos 06/20/2015  . Tdap 02/12/2019     Health Maintenance  Topic Date Due  . COVID-19 Vaccine (1) Never done  . PNA vac Low Risk Adult (1 of 2 - PCV13) Never done  . INFLUENZA VACCINE  03/12/2020  . TETANUS/TDAP  02/11/2029  . DEXA SCAN  Completed       Assessment  This is a routine wellness examination for [employer].  Health Maintenance: Due or Overdue Health Maintenance Due  Topic Date Due  . COVID-19 Vaccine (1) Never done  . PNA vac Low Risk Adult (1 of 2 - PCV13) Never done    Inita Puleo does not need a referral for Community Assistance: Care Management:   no Social Work:    no Prescription Assistance:  no Nutrition/Diabetes Education:  no   Plan:  Personalized Goals Goals Addressed            This Visit's Progress   . DIET - EAT MORE FRUITS AND VEGETABLES   On track   . Increase physical activity (pt-stated)   On track    Increase physical activity by walking 3 times a week for 30 minutes at a time weather permitting     . Prevent falls        Personalized Health Maintenance & Screening Recommendations  pt refused last 2 seasons of flu vaccine and is not currently seeking COVID vaccine or shingles vaccine.   Lung Cancer Screening Recommended: no (Low Dose CT Chest recommended if Age 44-80 years, 30 pack-year currently smoking OR have quit w/in past 15 years) Hepatitis C Screening recommended: no HIV Screening recommended: no  Advanced Directives: Written information was not prepared per patient's request.  Referrals & Orders No orders of the defined types were placed in this encounter.   Follow-up Plan . Follow-up with [name], DO as planned    I have personally reviewed and noted the following in the patient's chart:   . Medical and social history . Use of alcohol, tobacco or illicit drugs  . Current medications and supplements .  Functional ability and status . Nutritional status . Physical activity . Advanced directives . List of other  physicians . Hospitalizations, surgeries, and ER visits in previous 12 months . Vitals . Screenings to include cognitive, depression, and falls . Referrals and appointments  In addition, I have reviewed and discussed with Lynore Sobh certain preventive protocols, quality metrics, and best practice recommendations. A written personalized care plan for preventive services as well as general preventive health recommendations is available and can be mailed to the patient at her request.      [place] Dec  12/09/2019

## 2019-12-28 ENCOUNTER — Other Ambulatory Visit: Payer: Self-pay

## 2019-12-28 MED ORDER — LORATADINE 10 MG PO TABS: 10.0000 mg | ORAL_TABLET | Freq: Every day | ORAL | 1 refills | Status: DC

## 2020-01-12 DIAGNOSIS — R0789 Other chest pain: Secondary | ICD-10-CM | POA: Diagnosis not present

## 2020-01-12 DIAGNOSIS — K449 Diaphragmatic hernia without obstruction or gangrene: Secondary | ICD-10-CM | POA: Diagnosis not present

## 2020-01-12 DIAGNOSIS — R1310 Dysphagia, unspecified: Secondary | ICD-10-CM | POA: Diagnosis not present

## 2020-01-28 IMAGING — DX DG KNEE 1-2V*L*
2 series · 2 of 2 positions shown · non-contrast
Comparison: None.

CLINICAL DATA: Chronic left knee pain.

EXAM:
LEFT KNEE - 1-2 VIEW

[knee ap]
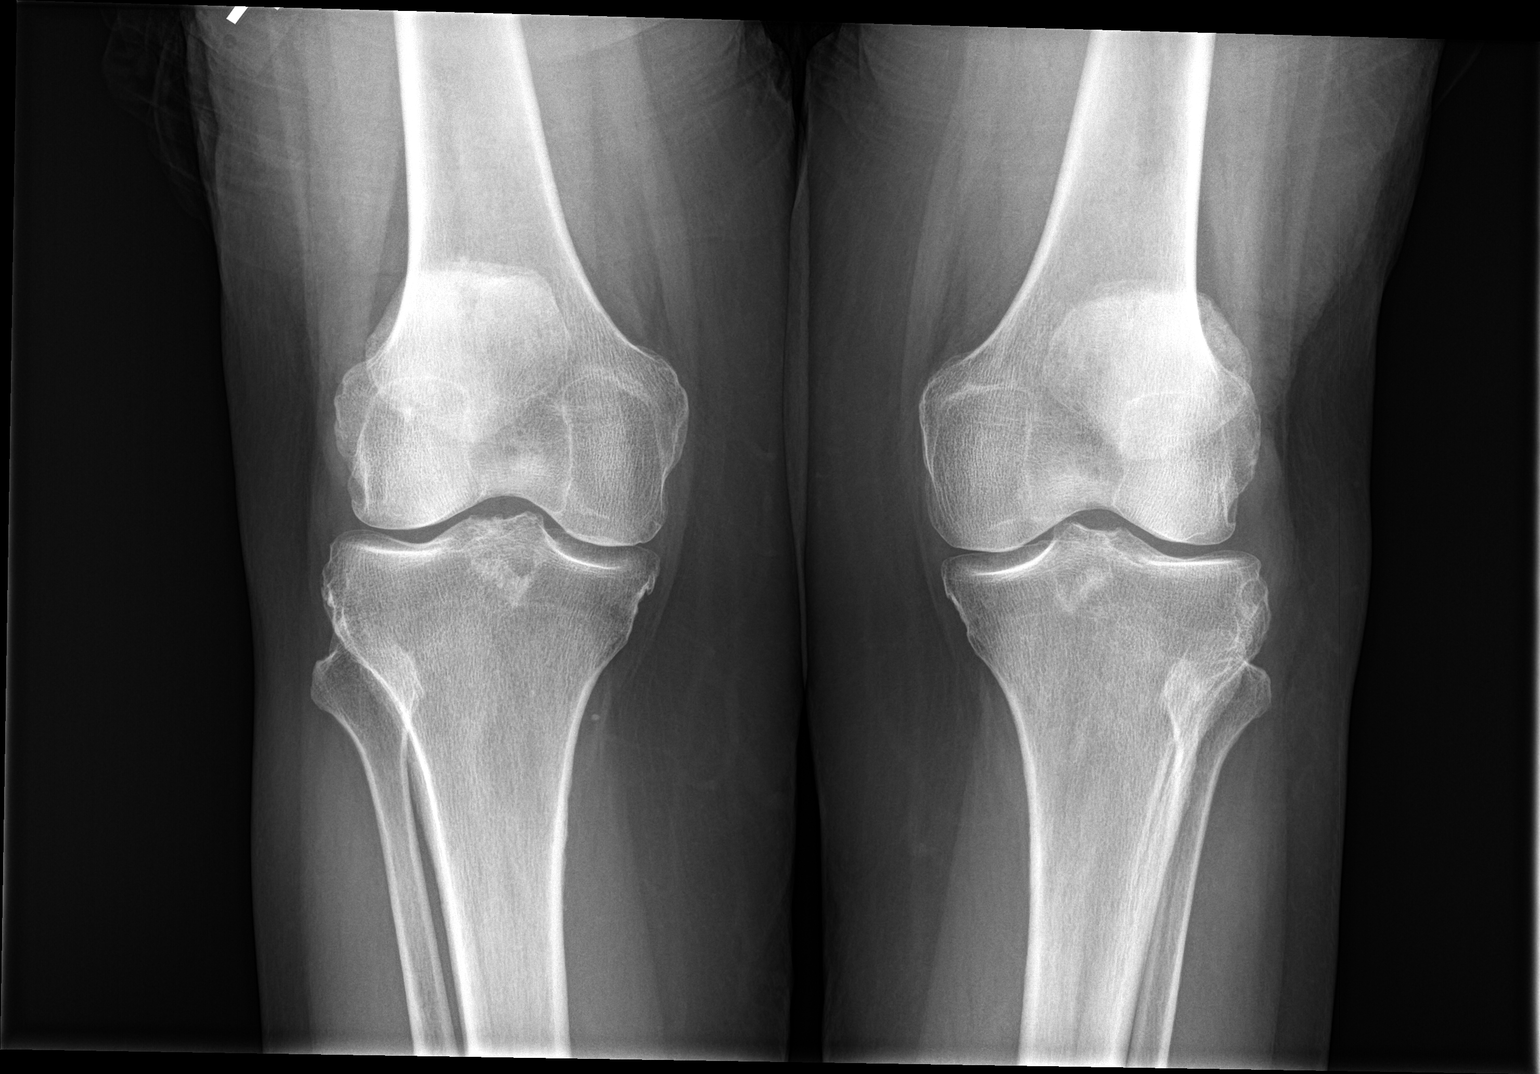

[knee lat]
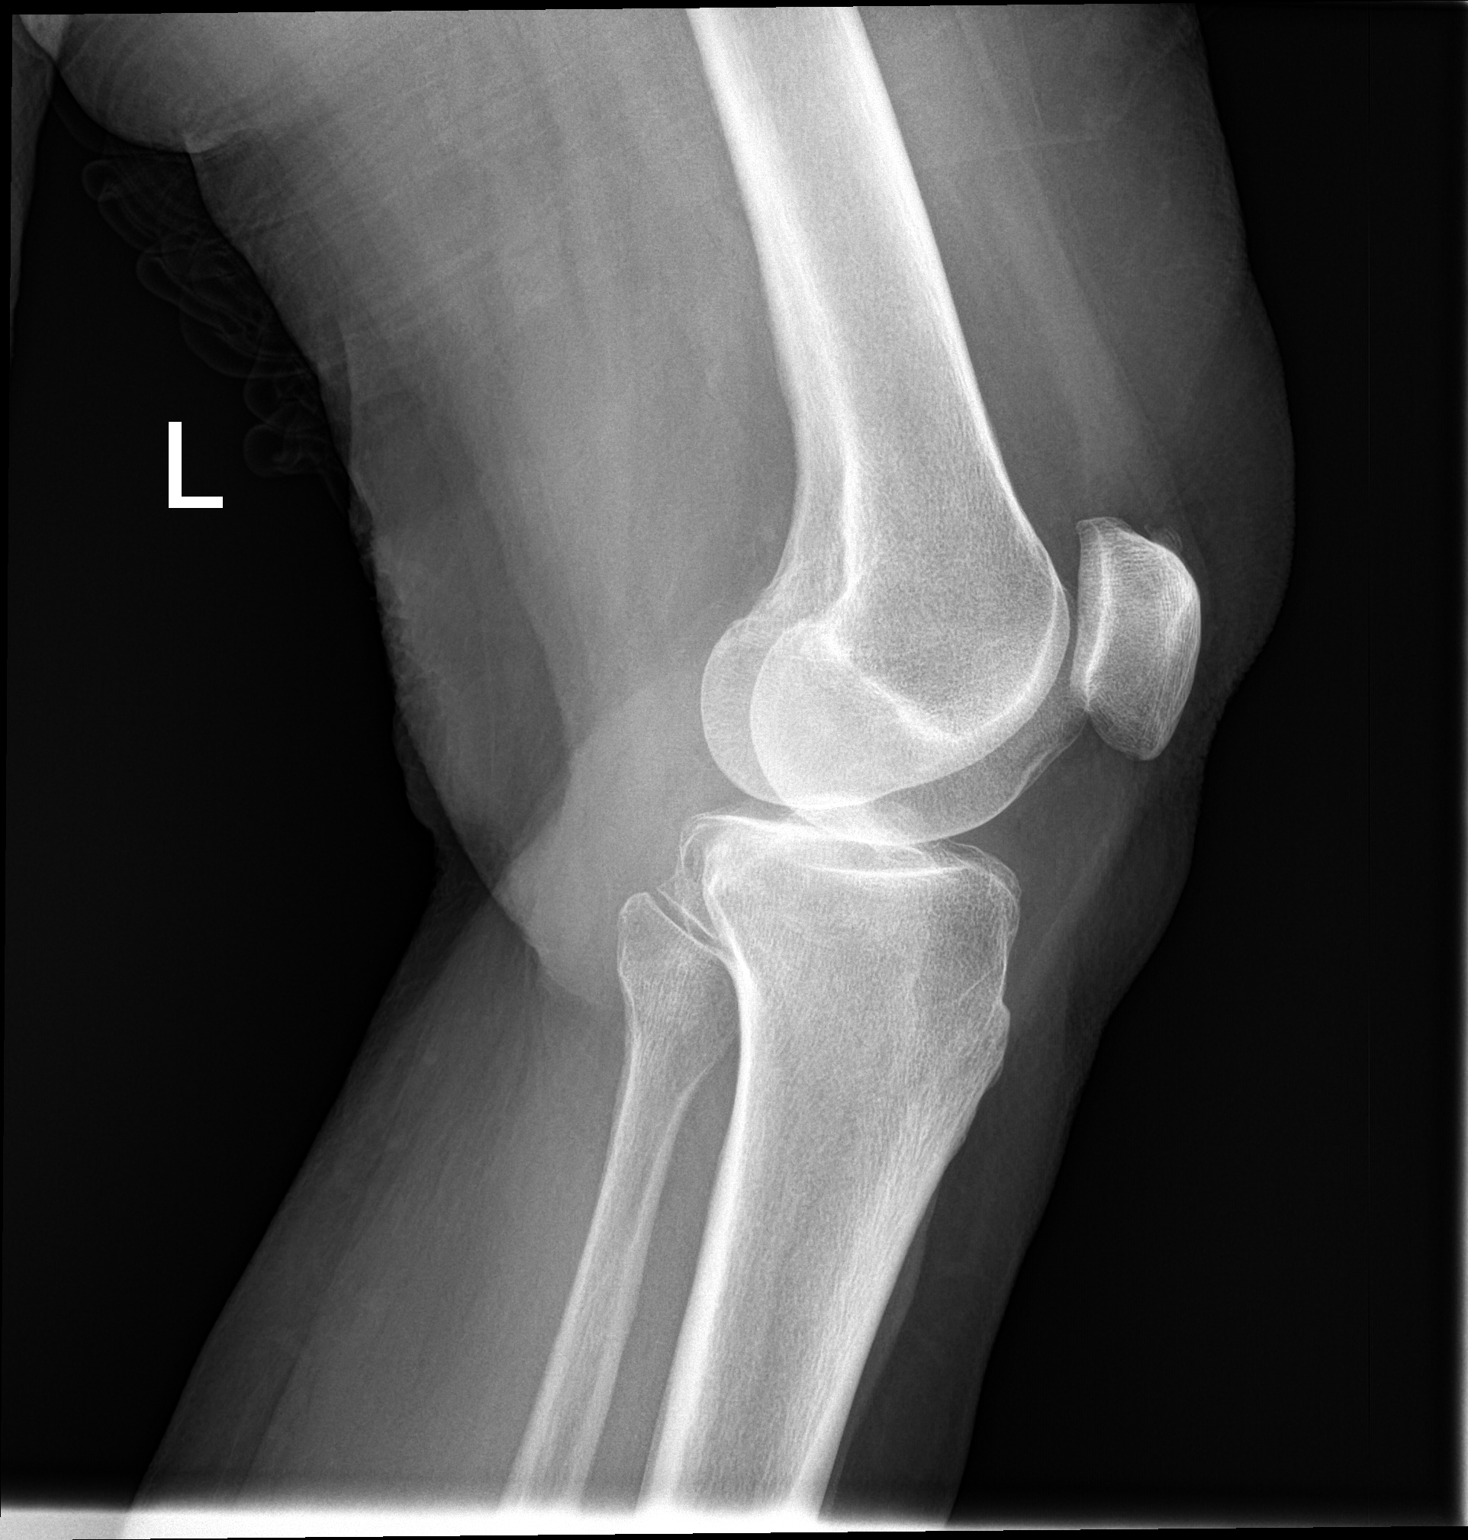

[2 of 2 positions shown; findings below may reference images not displayed]

FINDINGS: No evidence of fracture, dislocation, or joint effusion. No evidence
of arthropathy or other focal bone abnormality. Soft tissues are
unremarkable. Minimal calcifications in the distal quadriceps
tendon.
IMPRESSION: No significant abnormality.

## 2020-02-16 DIAGNOSIS — R0600 Dyspnea, unspecified: Secondary | ICD-10-CM | POA: Diagnosis not present

## 2020-02-16 DIAGNOSIS — I10 Essential (primary) hypertension: Secondary | ICD-10-CM | POA: Diagnosis not present

## 2020-02-28 ENCOUNTER — Ambulatory Visit: Payer: Medicare HMO | Admitting: Family Medicine

## 2020-02-28 ENCOUNTER — Ambulatory Visit: Payer: Medicare HMO | Admitting: Physician Assistant

## 2020-02-28 DIAGNOSIS — I2510 Atherosclerotic heart disease of native coronary artery without angina pectoris: Secondary | ICD-10-CM | POA: Diagnosis not present

## 2020-02-28 DIAGNOSIS — I2584 Coronary atherosclerosis due to calcified coronary lesion: Secondary | ICD-10-CM | POA: Diagnosis not present

## 2020-02-29 ENCOUNTER — Encounter: Payer: Self-pay | Admitting: Family Medicine

## 2020-02-29 ENCOUNTER — Ambulatory Visit (INDEPENDENT_AMBULATORY_CARE_PROVIDER_SITE_OTHER): Payer: Medicare HMO | Admitting: Family Medicine

## 2020-02-29 ENCOUNTER — Other Ambulatory Visit: Payer: Self-pay

## 2020-02-29 VITALS — BP 128/65 | HR 57 | Temp 97.0°F | Ht 63.0 in | Wt 178.6 lb

## 2020-02-29 DIAGNOSIS — E7800 Pure hypercholesterolemia, unspecified: Secondary | ICD-10-CM | POA: Diagnosis not present

## 2020-02-29 DIAGNOSIS — R5382 Chronic fatigue, unspecified: Secondary | ICD-10-CM | POA: Diagnosis not present

## 2020-02-29 DIAGNOSIS — K219 Gastro-esophageal reflux disease without esophagitis: Secondary | ICD-10-CM | POA: Diagnosis not present

## 2020-02-29 DIAGNOSIS — G4700 Insomnia, unspecified: Secondary | ICD-10-CM | POA: Diagnosis not present

## 2020-02-29 DIAGNOSIS — N1832 Chronic kidney disease, stage 3b: Secondary | ICD-10-CM | POA: Diagnosis not present

## 2020-02-29 DIAGNOSIS — Z7689 Persons encountering health services in other specified circumstances: Secondary | ICD-10-CM

## 2020-02-29 DIAGNOSIS — I10 Essential (primary) hypertension: Secondary | ICD-10-CM

## 2020-02-29 DIAGNOSIS — Z1159 Encounter for screening for other viral diseases: Secondary | ICD-10-CM

## 2020-02-29 MED ORDER — RABEPRAZOLE SODIUM 20 MG PO TBEC: 20.0000 mg | DELAYED_RELEASE_TABLET | Freq: Every day | ORAL | 3 refills | Status: DC

## 2020-02-29 MED ORDER — SIMVASTATIN 20 MG PO TABS: 20.0000 mg | ORAL_TABLET | Freq: Every day | ORAL | 3 refills | Status: DC

## 2020-02-29 MED ORDER — LOSARTAN POTASSIUM-HCTZ 100-25 MG PO TABS: 1.0000 | ORAL_TABLET | Freq: Every day | ORAL | 3 refills | Status: DC

## 2020-02-29 NOTE — Progress Notes (Signed)
Subjective: CC:est care, HTN, CKD3, HLD PCP: [name], DO TGY:BWLSLHT Wendy Barajas is a 79 y.o. female presenting to clinic today for:  1.  Hypertension with hyperlipidemia, CKD 3b Patient reports compliance with her Hyzaar 100-25 mg daily, simvastatin 20 mg daily.  She reports occasional chest pressure and easy fatigability with mild exertion.  Her cardiologist was working this up and she actually went in for CT scan yesterday but was unable to proceed with the imaging study due to impaired renal function.  She was unaware that she had CKD 3.  She has been using OTC NSAIDs.  She feels that she hydrates adequately.  Urine output is normal.  She certainly would like to see a kidney specialist about this.  2.  Insomnia Despite easy fatigability as above, she does report difficulty falling and staying asleep for more than 4 5 hours per night.  She does drink quite a bit of caffeine during the day which consists of sweet tea.  She has rare coffee.  She gets up maybe 1-2 times per night for urination.  She tried OTC melatonin but this was ineffective.   ROS: Per HPI  Allergies  Allergen Reactions  . Pollen Extract    Past Medical History:  Diagnosis Date  . Allergy   . Arthritis    bone spurs - bilateral knee issues   . GERD (gastroesophageal reflux disease)   . Hyperlipidemia   . Hypertension     Current Outpatient Medications:  .  calcium carbonate (OS-CAL - DOSED IN MG OF ELEMENTAL CALCIUM) 1250 (500 Ca) MG tablet, Take 1 tablet by mouth., Disp: , Rfl:  .  Cholecalciferol (VITAMIN D3) 400 UNITS CAPS, Take 1 capsule by mouth daily. , Disp: , Rfl:  .  loratadine (CLARITIN) 10 MG tablet, Take 1 tablet (10 mg total) by mouth daily., Disp: 90 tablet, Rfl: 1 .  losartan-hydrochlorothiazide (HYZAAR) 100-25 MG tablet, Take 1 tablet by mouth daily. (Needs to be seen before next refill), Disp: 90 tablet, Rfl: 0 .  RABEprazole (ACIPHEX) 20 MG tablet, Take 1 tablet (20 mg total) by mouth  daily., Disp: 90 tablet, Rfl: 0 .  simvastatin (ZOCOR) 20 MG tablet, TAKE 1 TABLET EVERY DAY, Disp: 90 tablet, Rfl: 0 Social History   Socioeconomic History  . Marital status: Widowed    Spouse name: Not on file  . Number of children: 1  . Years of education: Not on file  . Highest education level: 11th grade  Occupational History  . Occupation: Retired   Tobacco Use  . Smoking status: Never Smoker  . Smokeless tobacco: Never Used  Vaping Use  . Vaping Use: Never used  Substance and Sexual Activity  . Alcohol use: No    Alcohol/week: 0.0 standard drinks  . Drug use: No  . Sexual activity: Not Currently  Other Topics Concern  . Not on file  Social History Narrative   Live alone    Social Determinants of Health   Financial Resource Strain:   . Difficulty of Paying Living Expenses:   Food Insecurity:   . Worried About Charity fundraiser in the Last Year:   . Arboriculturist in the Last Year:   Transportation Needs:   . Film/video editor (Medical):   Marland Kitchen Lack of Transportation (Non-Medical):   Physical Activity:   . Days of Exercise per Week:   . Minutes of Exercise per Session:   Stress:   . Feeling of Stress :  Social Connections:   . Frequency of Communication with Friends and Family:   . Frequency of Social Gatherings with Friends and Family:   . Attends Religious Services:   . Active Member of Clubs or Organizations:   . Attends Club or Organization Meetings:   . Marital Status:   Intimate Partner Violence:   . Fear of Current or Ex-Partner:   . Emotionally Abused:   . Physically Abused:   . Sexually Abused:    Family History  Problem Relation Age of Onset  . Cancer Father        unsure = thinks colon   . Diabetes Mother   . Heart disease Mother   . Cancer Brother        unsure   . Stroke Brother   . Arthritis Sister        back issues     Objective: Office vital signs reviewed. BP 128/65   Pulse (!) 57   Temp (!) 97 F (36.1 C)   Ht 5'  3" (1.6 m)   Wt 178 lb 9.6 oz (81 kg)   SpO2 99%   BMI 31.64 kg/m   Physical Examination:  General: Awake, alert, well nourished, No acute distress HEENT: Normal, sclera white, MMM Cardio: regular rate and rhythm, S1S2 heard, no murmurs appreciated Pulm: clear to auscultation bilaterally, no wheezes, rhonchi or rales; normal work of breathing on room air Extremities: warm, well perfused, No edema, cyanosis or clubbing; +2 pulses bilaterally Neuro: Alert and oriented  Assessment/ Plan: 78 y.o. female   1. Essential hypertension Controlled. - CMP14+EGFR  2. Chronic kidney disease, stage 3b Unfortunately she was not aware that she had CKD 3B.  She really wants to see a nephrologist and I will go ahead and put that order in today.  She prefers [place].  We discussed avoidance of oral NSAIDs.  Okay to use topical if needed.  Recommended Tylenol if needed for pain control.  Hydration reinforced.  Continue ARB as prescribed.  3. Establishing care with new doctor, encounter for  4. Pure hypercholesterolemia Continue statin - CMP14+EGFR - TSH - simvastatin (ZOCOR) 20 MG tablet; Take 1 tablet (20 mg total) by mouth daily.  Dispense: 90 tablet; Refill: 3  5. Encounter for hepatitis C screening test for low risk patient - Hepatitis C antibody  6. Gastroesophageal reflux disease without esophagitis Controlled - RABEprazole (ACIPHEX) 20 MG tablet; Take 1 tablet (20 mg total) by mouth daily.  Dispense: 90 tablet; Refill: 3  7. Insomnia, unspecified type Reinforced sleep hygiene.  If persistently an issue at next visit, could consider adding trazodone.  8. Chronic fatigue ?  From cardiac etiology versus renal versus due to physical deconditioning.  Apparently her CT was canceled yesterday due to renal function.   Orders Placed This Encounter  Procedures  . CMP14+EGFR  . TSH  . Hepatitis C antibody  . Ambulatory referral to Nephrology    Referral Priority:   Routine     Referral Type:   Consultation    Referral Reason:   Specialty Services Required    Requested Specialty:   Nephrology    Number of Visits Requested:   1   Meds ordered this encounter  Medications  . losartan-hydrochlorothiazide (HYZAAR) 100-25 MG tablet    Sig: Take 1 tablet by mouth daily.    Dispense:  90 tablet    Refill:  3  . RABEprazole (ACIPHEX) 20 MG tablet    Sig: Take 1 tablet (20 mg total)   by mouth daily.    Dispense:  90 tablet    Refill:  3  . simvastatin (ZOCOR) 20 MG tablet    Sig: Take 1 tablet (20 mg total) by mouth daily.    Dispense:  90 tablet    Refill:  3     Ashly M Gottschalk, DO [facility] [phone]   

## 2020-02-29 NOTE — Patient Instructions (Signed)

## 2020-03-01 LAB — CMP14+EGFR
ALT: 9 IU/L (ref 0–32)
AST: 14 IU/L (ref 0–40)
Albumin/Globulin Ratio: 1.5 (ref 1.2–2.2)
Albumin: 4.4 g/dL (ref 3.7–4.7)
Alkaline Phosphatase: 104 IU/L (ref 48–121)
BUN/Creatinine Ratio: 17 (ref 12–28)
BUN: 30 mg/dL — ABNORMAL HIGH (ref 8–27)
Bilirubin Total: 0.8 mg/dL (ref 0.0–1.2)
CO2: 22 mmol/L (ref 20–29)
Calcium: 10 mg/dL (ref 8.7–10.3)
Chloride: 101 mmol/L (ref 96–106)
Creatinine, Ser: 1.73 mg/dL — ABNORMAL HIGH (ref 0.57–1.00)
GFR calc Af Amer: 32 mL/min/{1.73_m2} — ABNORMAL LOW (ref >59)
GFR calc non Af Amer: 28 mL/min/{1.73_m2} — ABNORMAL LOW (ref >59)
Globulin, Total: 2.9 g/dL (ref 1.5–4.5)
Glucose: 106 mg/dL — ABNORMAL HIGH (ref 65–99)
Potassium: 4.3 mmol/L (ref 3.5–5.2)
Sodium: 139 mmol/L (ref 134–144)
Total Protein: 7.3 g/dL (ref 6.0–8.5)

## 2020-03-01 LAB — TSH: TSH: 3.22 u[IU]/mL (ref 0.450–4.500)

## 2020-03-01 LAB — HEPATITIS C ANTIBODY: Hep C Virus Ab: <0.1 s/co ratio (ref 0.0–0.9)

## 2020-03-17 DIAGNOSIS — Z20822 Contact with and (suspected) exposure to covid-19: Secondary | ICD-10-CM | POA: Diagnosis not present

## 2020-04-09 DIAGNOSIS — R531 Weakness: Secondary | ICD-10-CM | POA: Diagnosis not present

## 2020-04-09 DIAGNOSIS — S0083XA Contusion of other part of head, initial encounter: Secondary | ICD-10-CM | POA: Diagnosis not present

## 2020-04-09 DIAGNOSIS — S0511XA Contusion of eyeball and orbital tissues, right eye, initial encounter: Secondary | ICD-10-CM | POA: Diagnosis not present

## 2020-04-09 DIAGNOSIS — U071 COVID-19: Secondary | ICD-10-CM | POA: Diagnosis not present

## 2020-04-09 DIAGNOSIS — R7989 Other specified abnormal findings of blood chemistry: Secondary | ICD-10-CM | POA: Diagnosis not present

## 2020-04-09 DIAGNOSIS — R918 Other nonspecific abnormal finding of lung field: Secondary | ICD-10-CM | POA: Diagnosis not present

## 2020-04-09 DIAGNOSIS — J1282 Pneumonia due to coronavirus disease 2019: Secondary | ICD-10-CM | POA: Diagnosis not present

## 2020-04-09 DIAGNOSIS — I129 Hypertensive chronic kidney disease with stage 1 through stage 4 chronic kidney disease, or unspecified chronic kidney disease: Secondary | ICD-10-CM | POA: Diagnosis not present

## 2020-04-09 DIAGNOSIS — N189 Chronic kidney disease, unspecified: Secondary | ICD-10-CM | POA: Diagnosis not present

## 2020-04-09 DIAGNOSIS — Z043 Encounter for examination and observation following other accident: Secondary | ICD-10-CM | POA: Diagnosis not present

## 2020-04-09 DIAGNOSIS — R0602 Shortness of breath: Secondary | ICD-10-CM | POA: Diagnosis not present

## 2020-04-09 DIAGNOSIS — W010XXA Fall on same level from slipping, tripping and stumbling without subsequent striking against object, initial encounter: Secondary | ICD-10-CM | POA: Diagnosis not present

## 2020-04-09 DIAGNOSIS — R55 Syncope and collapse: Secondary | ICD-10-CM | POA: Diagnosis not present

## 2020-04-09 DIAGNOSIS — E785 Hyperlipidemia, unspecified: Secondary | ICD-10-CM | POA: Diagnosis not present

## 2020-04-09 DIAGNOSIS — S0012XA Contusion of left eyelid and periocular area, initial encounter: Secondary | ICD-10-CM | POA: Diagnosis not present

## 2020-04-09 DIAGNOSIS — E860 Dehydration: Secondary | ICD-10-CM | POA: Diagnosis not present

## 2020-04-09 DIAGNOSIS — S0990XA Unspecified injury of head, initial encounter: Secondary | ICD-10-CM | POA: Diagnosis not present

## 2020-04-10 ENCOUNTER — Telehealth: Payer: Self-pay

## 2020-04-10 NOTE — Telephone Encounter (Signed)
Just checking to see if I can actually see pt in the middle of the day with active COVID infection.  May need to try and get her placed in my acute afternoon clinic.

## 2020-04-10 NOTE — Telephone Encounter (Signed)
    TRANSITIONAL CARE MANAGEMENT TELEPHONE OUTREACH NOTE   Contact Date: 04/10/2020 Contacted By: Nigel Berthold   DISCHARGE INFORMATION Date of Discharge:04/09/2020  Discharge Facility: [facility] Principal Discharge Diagnosis: COVID   Outpatient Follow Up Recommendations (copied from discharge summary) Discharge Plan: = f/u with pcp who will hopefully continue to watch her to make sure the monoclonals worked  = azithromycin for left sided pna = monitor oxygen conc with pulse ox as an outpt = d/c home   Wendy Barajas is a female primary care patient of [name], DO. An outgoing telephone call was made today and I spoke with patient.  Ms. Nader condition(s) and treatment(s) were discussed. An opportunity to ask questions was provided and all were answered or forwarded as appropriate.    ACTIVITIES OF DAILY LIVING  [place] lives alone and she can perform ADLs independently. her primary caregiver is self. she is able to depend on her primary caregiver(s) for consistent help. Transportation to appointments, to pick up medications, and to run errands is not a problem.  (Consider referral to [facility] if transportation or a consistent caregiver is a problem)   Fall Risk Fall Risk  02/29/2020 12/09/2019  Falls in the past year? 0 0  Number falls in past yr: - -  Injury with Fall? - -  Follow up - -    low [place] Modifications/Assistive Devices Wheelchair: No Cane: Yes Ramp: No Bedside Toilet: No Hospital Bed:  No Other:    [place] she is not receiving home health.  MEDICATION RECONCILIATION  Ms. Schlottman has been able to pick-up all prescribed discharge medications from the pharmacy.   A post discharge medication reconciliation was performed and the complete medication list was reviewed with the patient/caregiver and is current as of 04/10/2020. Changes highlighted below.  Discontinued  Medications none  Current Medication List Allergies as of 04/10/2020      Reactions   Pollen Extract       Medication List       Accurate as of April 10, 2020 11:50 AM. If you have any questions, ask your nurse or doctor.        calcium carbonate 1250 (500 Ca) MG tablet Commonly known as: OS-CAL - dosed in mg of elemental calcium Take 1 tablet by mouth.   loratadine 10 MG tablet Commonly known as: CLARITIN Take 1 tablet (10 mg total) by mouth daily.   losartan-hydrochlorothiazide 100-25 MG tablet Commonly known as: HYZAAR Take 1 tablet by mouth daily.   RABEprazole 20 MG tablet Commonly known as: Aciphex Take 1 tablet (20 mg total) by mouth daily.   simvastatin 20 MG tablet Commonly known as: ZOCOR Take 1 tablet (20 mg total) by mouth daily.   Vitamin D3 10 MCG (400 UNIT) Caps Take 1 capsule by mouth daily.        PATIENT EDUCATION & FOLLOW-UP PLAN  An appointment for Transitional Care Management is scheduled with [name], DO on 09/012021  at 11:15am.  Take all medications as prescribed  Contact our office by calling [phone] if you have any questions or concerns

## 2020-04-11 ENCOUNTER — Telehealth: Payer: Medicare HMO

## 2020-04-12 ENCOUNTER — Telehealth (INDEPENDENT_AMBULATORY_CARE_PROVIDER_SITE_OTHER): Payer: Medicare HMO | Admitting: Family Medicine

## 2020-04-12 ENCOUNTER — Encounter: Payer: Self-pay | Admitting: Family Medicine

## 2020-04-12 DIAGNOSIS — R110 Nausea: Secondary | ICD-10-CM

## 2020-04-12 DIAGNOSIS — U071 COVID-19: Secondary | ICD-10-CM

## 2020-04-12 DIAGNOSIS — J189 Pneumonia, unspecified organism: Secondary | ICD-10-CM | POA: Diagnosis not present

## 2020-04-12 DIAGNOSIS — R9431 Abnormal electrocardiogram [ECG] [EKG]: Secondary | ICD-10-CM | POA: Diagnosis not present

## 2020-04-12 DIAGNOSIS — Z09 Encounter for follow-up examination after completed treatment for conditions other than malignant neoplasm: Secondary | ICD-10-CM

## 2020-04-12 MED ORDER — LORAZEPAM 0.5 MG PO TABS: ORAL_TABLET | ORAL | 0 refills | Status: DC

## 2020-04-12 NOTE — Progress Notes (Signed)
MyChart Video visit  Subjective: CC: hospital follow up/ TOC/ COVID PCP: [name], DO GEX:BMWUXLK Straw is a 79 y.o. female. Patient provides verbal consent for consult held via video.  Due to COVID-19 pandemic this visit was conducted virtually. This visit type was conducted due to national recommendations for restrictions regarding the COVID-19 Pandemic (e.g. social distancing, sheltering in place) in an effort to limit this patient's exposure and mitigate transmission in our community. All issues noted in this document were discussed and addressed.  A physical exam was not performed with this format.   Location of patient: home Location of provider: [facility] Others present for call: none  1. COVID 19 infection She was found to be POS for COVID 19.  She was discharged on ZPAK, treated with Monoclonal Abs.  Overall she is feeling better but today has been the "worst day" that she has had since discharge from [place].  She notes some nausea this morning was only able to keep down an egg.  Not vomiting.  She continues to have diarrhea this morning.  No blood in stool.  No fevers.  No hemoptysis.  Minimal coughing that is relieved by OTC meds.  She has about 2 days left of the her Z-Pak.  She has not been keeping a close eye on blood pressure but has continued her Hyzaar.  Does not report any dizziness.  She still has not heard back for an appointment in [facility] with nephrology and does want to ask about that today.  She is hydrating without difficulty.  Urine output is normal.  ROS: Per HPI  Allergies  Allergen Reactions   Pollen Extract    Past Medical History:  Diagnosis Date   Allergy    Arthritis    bone spurs - bilateral knee issues    GERD (gastroesophageal reflux disease)    Hyperlipidemia    Hypertension     Current Outpatient Medications:    calcium carbonate (OS-CAL - DOSED IN MG OF ELEMENTAL CALCIUM) 1250 (500 Ca) MG tablet, Take 1 tablet by mouth., Disp:  , Rfl:    Cholecalciferol (VITAMIN D3) 400 UNITS CAPS, Take 1 capsule by mouth daily. , Disp: , Rfl:    loratadine (CLARITIN) 10 MG tablet, Take 1 tablet (10 mg total) by mouth daily., Disp: 90 tablet, Rfl: 1   losartan-hydrochlorothiazide (HYZAAR) 100-25 MG tablet, Take 1 tablet by mouth daily., Disp: 90 tablet, Rfl: 3   RABEprazole (ACIPHEX) 20 MG tablet, Take 1 tablet (20 mg total) by mouth daily., Disp: 90 tablet, Rfl: 3   simvastatin (ZOCOR) 20 MG tablet, Take 1 tablet (20 mg total) by mouth daily., Disp: 90 tablet, Rfl: 3  Gen: ecchymosis noted of right orbit, NAD, Alert, oriented Pulmonary: normal WOB on room air. No coughing Neuro: mentation appropriate. Patient is interactive  Assessment/ Plan: 79 y.o. female   COVID-19 virus infection  Hospital discharge follow-up  Pneumonia of left lower lobe due to infectious organism  Nausea - Plan: LORazepam (ATIVAN) 0.5 MG tablet  Prolonged Q-T interval on ECG - Plan: LORazepam (ATIVAN) 0.5 MG tablet  I reviewed her ED record, treatment and results.  Her QTC is prolonged and therefore I hesitate to add Zofran or promethazine for nausea control.  Instead I am going to put her on low-dose Ativan dissolved under the tongue every 8 hours as needed for nausea.  She will first, however, try to use naturopathic remedies like ginger and/or peppermint.  Caution sedation and dizziness with Ativan if ultimately  needed.  I am also having her hold her Hyzaar and keeping a close eye on blood pressures while she is actively still having diarrhea and trying to recover from Covid infection.  We discussed goal blood pressure of less than 140/90 given CKD.  She will contact me if blood pressure is uncontrolled and we can consider amlodipine instead.  She understands red flag signs and symptoms.  She will contact me if needed  I will also have referral coordinator reach out to nephrology to figure out what is going on with her new patient  appointment.   Today's visit is for Transitional Care Management.  The patient was discharged from [facility] on 04/09/2020 with a primary diagnosis of COVID 19 infection, LLL Pneumonia, syncope and collapse.   Contact with the patient and/or caregiver, by a clinical staff member, was made on 04/10/2020 and was documented as a telephone encounter within the EMR.  Through chart review and discussion with the patient I have determined that management of their condition is of moderate complexity.   Start time: 11:22am; resent text @ 11:27am; phone call 11:28am to cell and home phone (she forgot about appt) End time: 11:40am  Total time spent on patient care (including video visit/ documentation): 17 minutes  [place], [place] [phone]

## 2020-05-03 ENCOUNTER — Other Ambulatory Visit: Payer: Self-pay | Admitting: Family Medicine

## 2020-05-08 NOTE — Chronic Care Management (AMB) (Signed)
  Chronic Care Management   Outreach Note  05/07/2019 Name: Wendy Barajas MRN: [id number] DOB: [birth year]  Referred by: [name], DO Reason for referral : Chronic Care Management   Third unsuccessful telephone outreach was attempted today. The patient was referred to the case management team for assistance with care management and care coordination. The patient's primary care provider has been notified of our unsuccessful attempts to make or maintain contact with the patient. The care management team is pleased to engage with this patient at any time in the future should he/she be interested in assistance from the care management team.   Follow Up Plan: A HIPAA compliant phone message was left for the patient providing contact information and requesting a return call.  enrollment status changed to "previously enrolled" after 3 consecutive attempts to contact the patient  Chong Sicilian, BSN, RN-BC [place] / [place]: [phone]

## 2020-06-02 ENCOUNTER — Encounter: Payer: Self-pay | Admitting: Family Medicine

## 2020-06-02 ENCOUNTER — Ambulatory Visit (INDEPENDENT_AMBULATORY_CARE_PROVIDER_SITE_OTHER): Payer: Medicare HMO | Admitting: Family Medicine

## 2020-06-02 ENCOUNTER — Other Ambulatory Visit: Payer: Self-pay

## 2020-06-02 VITALS — BP 131/73 | HR 78 | Temp 97.5°F | Ht 63.0 in | Wt 176.0 lb

## 2020-06-02 DIAGNOSIS — G8929 Other chronic pain: Secondary | ICD-10-CM | POA: Diagnosis not present

## 2020-06-02 DIAGNOSIS — L304 Erythema intertrigo: Secondary | ICD-10-CM | POA: Diagnosis not present

## 2020-06-02 DIAGNOSIS — M25562 Pain in left knee: Secondary | ICD-10-CM

## 2020-06-02 DIAGNOSIS — N1832 Chronic kidney disease, stage 3b: Secondary | ICD-10-CM | POA: Diagnosis not present

## 2020-06-02 DIAGNOSIS — K2100 Gastro-esophageal reflux disease with esophagitis, without bleeding: Secondary | ICD-10-CM | POA: Diagnosis not present

## 2020-06-02 DIAGNOSIS — I10 Essential (primary) hypertension: Secondary | ICD-10-CM

## 2020-06-02 MED ORDER — NYSTATIN 100000 UNIT/GM EX CREA: 1.0000 "application " | TOPICAL_CREAM | Freq: Two times a day (BID) | CUTANEOUS | 0 refills | Status: DC

## 2020-06-02 NOTE — Progress Notes (Addendum)
Subjective: CC: f/u HTN, CKD3, HLD PCP: [name], DO INO:MVEHMCN Wendy Barajas is a 79 y.o. female presenting to clinic today for:  1.  Hypertension with hyperlipidemia, CKD 3b Compliant with Hyzaar 100-25 mg daily, simvastatin 20 mg daily.  She has an appointment to establish care with nephrology on November 1.  She denies any edema, chest pain, shortness of breath.  She reports that she is recovered well from COVID-19 and her baseline energy is back.  Urine output is good.  She did have a couple of episodes of yellow spots in her underwear a few weeks ago but denies any incontinence now.  2.  Intertrigo Patient reports an itchy rash on the insides of bilateral thighs.  She tries to keep the areas dry.  No use of OTC topicals.  3.  GERD Patient reports concerns regarding AcipHex and renal function.  She does report esophagitis.  No melena or hematochezia.  Nausea has resolved since COVID-19.  4.  Chronic left knee pain Patient with ongoing chronic left knee pain that is refractory to repeat steroid injections x2.  She started glucosamine a couple of weeks ago and is optimistic about how will affect her.  She has not contacted the orthopedist in [place] for further evaluation or discussion.  ROS: Per HPI  Allergies  Allergen Reactions   Pollen Extract    Past Medical History:  Diagnosis Date   Allergy    Arthritis    bone spurs - bilateral knee issues    GERD (gastroesophageal reflux disease)    Hyperlipidemia    Hypertension     Current Outpatient Medications:    azithromycin (ZITHROMAX) 250 MG tablet, , Disp: , Rfl:    calcium carbonate (OS-CAL - DOSED IN MG OF ELEMENTAL CALCIUM) 1250 (500 Ca) MG tablet, Take 1 tablet by mouth., Disp: , Rfl:    Cholecalciferol (VITAMIN D3) 400 UNITS CAPS, Take 1 capsule by mouth daily. , Disp: , Rfl:    loratadine (CLARITIN) 10 MG tablet, TAKE 1 TABLET EVERY DAY, Disp: 90 tablet, Rfl: 1   LORazepam (ATIVAN) 0.5 MG tablet,  Dissolve 1/2-1 tablet under the tongue every 8 hours as needed for severe nausea., Disp: 10 tablet, Rfl: 0   losartan-hydrochlorothiazide (HYZAAR) 100-25 MG tablet, Take 1 tablet by mouth daily., Disp: 90 tablet, Rfl: 3   RABEprazole (ACIPHEX) 20 MG tablet, Take 1 tablet (20 mg total) by mouth daily., Disp: 90 tablet, Rfl: 3   simvastatin (ZOCOR) 20 MG tablet, Take 1 tablet (20 mg total) by mouth daily., Disp: 90 tablet, Rfl: 3 Social History   Socioeconomic History   Marital status: Widowed    Spouse name: Not on file   Number of children: 1   Years of education: Not on file   Highest education level: 11th grade  Occupational History   Occupation: Retired   Tobacco Use   Smoking status: Never Smoker   Smokeless tobacco: Never Used  Scientific laboratory technician Use: Never used  Substance and Sexual Activity   Alcohol use: No    Alcohol/week: 0.0 standard drinks   Drug use: No   Sexual activity: Not Currently  Other Topics Concern   Not on file  Social History Narrative   Live alone    Social Determinants of Health   Financial Resource Strain:    Difficulty of Paying Living Expenses: Not on file  Food Insecurity:    Worried About Running Out of Food in the Last Year: Not on file  Ran Out of Food in the Last Year: Not on file  Transportation Needs:    Lack of Transportation (Medical): Not on file   Lack of Transportation (Non-Medical): Not on file  Physical Activity:    Days of Exercise per Week: Not on file   Minutes of Exercise per Session: Not on file  Stress:    Feeling of Stress : Not on file  Social Connections:    Frequency of Communication with Friends and Family: Not on file   Frequency of Social Gatherings with Friends and Family: Not on file   Attends Religious Services: Not on file   Active Member of Clubs or Organizations: Not on file   Attends Archivist Meetings: Not on file   Marital Status: Not on file  Intimate Partner  Violence:    Fear of Current or Ex-Partner: Not on file   Emotionally Abused: Not on file   Physically Abused: Not on file   Sexually Abused: Not on file   Family History  Problem Relation Age of Onset   Cancer Father        unsure = thinks colon    Diabetes Mother    Heart disease Mother    Cancer Brother        unsure    Stroke Brother    Arthritis Sister        back issues     Objective: Office vital signs reviewed. BP 131/73    Pulse 78    Temp (!) 97.5 F (36.4 C) (Temporal)    Ht 5\' 3"  (1.6 m)    Wt 176 lb (79.8 kg)    SpO2 99%    BMI 31.18 kg/m   Physical Examination:  General: Awake, alert, well nourished, No acute distress HEENT: Normal, sclera white, MMM Cardio: regular rate and rhythm, S1S2 heard, no murmurs appreciated Pulm: clear to auscultation bilaterally, Normal work of breathing on room air Extremities: warm, well perfused, No edema, cyanosis or clubbing; +2 pulses bilaterally  Assessment/ Plan: 79 y.o. female   1. Essential hypertension Well-controlled.  Continue current regimen.  Will defer labs to nephrology at her appointment on 11 1  2. Chronic kidney disease, stage 3b ([place]) As above  3. Intertrigo Nystatin.  Reinforced home care instructions.  Follow-up as needed - nystatin cream (MYCOSTATIN); Apply 1 application topically 2 (two) times daily. To the groin areas x7-14 days per flare.  Dispense: 30 g; Refill: 0  4. Gastroesophageal reflux disease with esophagitis without hemorrhage Moderately controlled with AcipHex.  We discussed that the PPIs need to be taken in context with the risk-benefit approach.  Given her reports of esophagitis, I worry about changes within the throat that would lead to throat cancers and/or bleeds in the stomach.  For now, continue AcipHex but we discussed that we could consider alternatives like H2 blockers.  Will await for input by her nephrologist as well.  5. Chronic pain of left knee May benefit from  viscosupplementation.  Okay to continue glucosamine.  She will contact me if she needs a referral to orthopedics.  No orders of the defined types were placed in this encounter.  No orders of the defined types were placed in this encounter.    [name], DO [place] [phone]

## 2020-06-02 NOTE — Patient Instructions (Signed)
Intertrigo Intertrigo is skin irritation (inflammation) that happens in warm, moist areas of the body. The irritation can cause a rash and make skin raw and itchy. The rash is usually pink or red. It happens mostly between folds of skin or where skin rubs together, such as:  Between the toes.  In the armpits.  In the groin area.  Under the belly.  Under the breasts.  Around the butt area. This condition is not passed from person to person (is not contagious). What are the causes?  Heat, moisture, rubbing, and not enough air movement.  The condition can be made worse by: ? Sweat. ? Bacteria. ? A fungus, such as yeast. What increases the risk?  Moisture in your skin folds.  You are more likely to develop this condition if you: ? Have diabetes. ? Are overweight. ? Are not able to move around. ? Live in a warm and moist climate. ? Wear splints, braces, or other medical devices. ? Are not able to control your pee (urine) or poop (stool). What are the signs or symptoms?  A pink or red skin rash in the skin fold or near the skin fold.  Raw or scaly skin.  Itching.  A burning feeling.  Bleeding.  Leaking fluid.  A bad smell. How is this treated?  Cleaning and drying your skin.  Taking an antibiotic medicine or using an antibiotic skin cream for a bacterial infection.  Using an antifungal cream on your skin or taking pills for an infection that was caused by a fungus, such as yeast.  Using a steroid ointment to stop the itching and irritation.  Separating the skin fold with a clean cotton cloth to absorb moisture and allow air to flow into the area. Follow these instructions at home:  Keep the affected area clean and dry.  Do not scratch your skin.  Stay cool as much as you can. Use an air conditioner or a fan, if you have one.  Apply over-the-counter and prescription medicines only as told by your doctor.  If you were prescribed an antibiotic medicine,  use it as told by your doctor. Do not stop using the antibiotic even if your condition starts to get better.  Keep all follow-up visits as told by your doctor. This is important. How is this prevented?   Stay at a healthy weight.  Take care of your feet. This is very important if you have diabetes. You should: ? Wear shoes that fit well. ? Keep your feet dry. ? Wear clean cotton or wool socks.  Protect the skin in your groin and butt area as told by your doctor. To do this: ? Follow a regular cleaning routine. ? Use creams, powders, or ointments that protect your skin. ? Change protection pads often.  Do not wear tight clothes. Wear clothes that: ? Are loose. ? Take moisture away from your body. ? Are made of cotton.  Wear a bra that gives good support, if needed.  Shower and dry yourself well after being active. Use a hair dryer on a cool setting to dry between skin folds.  Keep your blood sugar under control if you have diabetes. Contact a doctor if:  Your symptoms do not get better with treatment.  Your symptoms get worse or they spread.  You notice more redness and warmth.  You have a fever. Summary  Intertrigo is skin irritation that occurs when folds of skin rub together.  This condition is caused by heat, moisture,   and rubbing.  This condition may be treated by cleaning and drying your skin and with medicines.  Apply over-the-counter and prescription medicines only as told by your doctor.  Keep all follow-up visits as told by your doctor. This is important. This information is not intended to replace advice given to you by your health care provider. Make sure you discuss any questions you have with your health care provider. Document Revised: 05/07/2018 Document Reviewed: 05/07/2018 Elsevier Patient Education  2020 Elsevier Inc.  

## 2020-06-12 DIAGNOSIS — Z8616 Personal history of COVID-19: Secondary | ICD-10-CM | POA: Diagnosis not present

## 2020-06-12 DIAGNOSIS — I129 Hypertensive chronic kidney disease with stage 1 through stage 4 chronic kidney disease, or unspecified chronic kidney disease: Secondary | ICD-10-CM | POA: Diagnosis not present

## 2020-06-12 DIAGNOSIS — N184 Chronic kidney disease, stage 4 (severe): Secondary | ICD-10-CM | POA: Diagnosis not present

## 2020-06-12 DIAGNOSIS — R82998 Other abnormal findings in urine: Secondary | ICD-10-CM | POA: Diagnosis not present

## 2020-06-12 DIAGNOSIS — N183 Chronic kidney disease, stage 3 unspecified: Secondary | ICD-10-CM | POA: Diagnosis not present

## 2020-06-12 DIAGNOSIS — K219 Gastro-esophageal reflux disease without esophagitis: Secondary | ICD-10-CM | POA: Diagnosis not present

## 2020-06-12 DIAGNOSIS — Z87442 Personal history of urinary calculi: Secondary | ICD-10-CM | POA: Diagnosis not present

## 2020-07-10 ENCOUNTER — Encounter: Payer: Self-pay | Admitting: Nurse Practitioner

## 2020-07-10 ENCOUNTER — Ambulatory Visit (INDEPENDENT_AMBULATORY_CARE_PROVIDER_SITE_OTHER): Payer: Medicare HMO | Admitting: Nurse Practitioner

## 2020-07-10 ENCOUNTER — Other Ambulatory Visit: Payer: Self-pay

## 2020-07-10 VITALS — BP 153/87 | HR 83 | Temp 97.1°F | Resp 20 | Ht 63.0 in | Wt 182.0 lb

## 2020-07-10 DIAGNOSIS — I10 Essential (primary) hypertension: Secondary | ICD-10-CM | POA: Diagnosis not present

## 2020-07-10 MED ORDER — AMLODIPINE BESYLATE 5 MG PO TABS: 2.5000 mg | ORAL_TABLET | Freq: Every day | ORAL | 3 refills | Status: DC

## 2020-07-10 MED ORDER — LOSARTAN POTASSIUM 50 MG PO TABS: 50.0000 mg | ORAL_TABLET | Freq: Every day | ORAL | 3 refills | Status: DC

## 2020-07-10 NOTE — Patient Instructions (Signed)
DASH Eating Plan DASH stands for "Dietary Approaches to Stop Hypertension." The DASH eating plan is a healthy eating plan that has been shown to reduce high blood pressure (hypertension). It may also reduce your risk for type 2 diabetes, heart disease, and stroke. The DASH eating plan may also help with weight loss. What are tips for following this plan?  General guidelines  Avoid eating more than 2,300 mg (milligrams) of salt (sodium) a day. If you have hypertension, you may need to reduce your sodium intake to 1,500 mg a day.  Limit alcohol intake to no more than 1 drink a day for nonpregnant women and 2 drinks a day for men. One drink equals 12 oz of beer, 5 oz of wine, or 1 oz of hard liquor.  Work with your health care provider to maintain a healthy body weight or to lose weight. Ask what an ideal weight is for you.  Get at least 30 minutes of exercise that causes your heart to beat faster (aerobic exercise) most days of the week. Activities may include walking, swimming, or biking.  Work with your health care provider or diet and nutrition specialist (dietitian) to adjust your eating plan to your individual calorie needs. Reading food labels   Check food labels for the amount of sodium per serving. Choose foods with less than 5 percent of the Daily Value of sodium. Generally, foods with less than 300 mg of sodium per serving fit into this eating plan.  To find whole grains, look for the word "whole" as the first word in the ingredient list. Shopping  Buy products labeled as "low-sodium" or "no salt added."  Buy fresh foods. Avoid canned foods and premade or frozen meals. Cooking  Avoid adding salt when cooking. Use salt-free seasonings or herbs instead of table salt or sea salt. Check with your health care provider or pharmacist before using salt substitutes.  Do not fry foods. Cook foods using healthy methods such as baking, boiling, grilling, and broiling instead.  Cook with  heart-healthy oils, such as olive, canola, soybean, or sunflower oil. Meal planning  Eat a balanced diet that includes: ? 5 or more servings of fruits and vegetables each day. At each meal, try to fill half of your plate with fruits and vegetables. ? Up to 6-8 servings of whole grains each day. ? Less than 6 oz of lean meat, poultry, or fish each day. A 3-oz serving of meat is about the same size as a deck of cards. One egg equals 1 oz. ? 2 servings of low-fat dairy each day. ? A serving of nuts, seeds, or beans 5 times each week. ? Heart-healthy fats. Healthy fats called Omega-3 fatty acids are found in foods such as flaxseeds and coldwater fish, like sardines, salmon, and mackerel.  Limit how much you eat of the following: ? Canned or prepackaged foods. ? Food that is high in trans fat, such as fried foods. ? Food that is high in saturated fat, such as fatty meat. ? Sweets, desserts, sugary drinks, and other foods with added sugar. ? Full-fat dairy products.  Do not salt foods before eating.  Try to eat at least 2 vegetarian meals each week.  Eat more home-cooked food and less restaurant, buffet, and fast food.  When eating at a restaurant, ask that your food be prepared with less salt or no salt, if possible. What foods are recommended? The items listed may not be a complete list. Talk with your dietitian about   what dietary choices are best for you. Grains Whole-grain or whole-wheat bread. Whole-grain or whole-wheat pasta. Brown rice. Oatmeal. Quinoa. Bulgur. Whole-grain and low-sodium cereals. Pita bread. Low-fat, low-sodium crackers. Whole-wheat flour tortillas. Vegetables Fresh or frozen vegetables (raw, steamed, roasted, or grilled). Low-sodium or reduced-sodium tomato and vegetable juice. Low-sodium or reduced-sodium tomato sauce and tomato paste. Low-sodium or reduced-sodium canned vegetables. Fruits All fresh, dried, or frozen fruit. Canned fruit in natural juice (without  added sugar). Meat and other protein foods Skinless chicken or turkey. Ground chicken or turkey. Pork with fat trimmed off. Fish and seafood. Egg whites. Dried beans, peas, or lentils. Unsalted nuts, nut butters, and seeds. Unsalted canned beans. Lean cuts of beef with fat trimmed off. Low-sodium, lean deli meat. Dairy Low-fat (1%) or fat-free (skim) milk. Fat-free, low-fat, or reduced-fat cheeses. Nonfat, low-sodium ricotta or cottage cheese. Low-fat or nonfat yogurt. Low-fat, low-sodium cheese. Fats and oils Soft margarine without trans fats. Vegetable oil. Low-fat, reduced-fat, or light mayonnaise and salad dressings (reduced-sodium). Canola, safflower, olive, soybean, and sunflower oils. Avocado. Seasoning and other foods Herbs. Spices. Seasoning mixes without salt. Unsalted popcorn and pretzels. Fat-free sweets. What foods are not recommended? The items listed may not be a complete list. Talk with your dietitian about what dietary choices are best for you. Grains Baked goods made with fat, such as croissants, muffins, or some breads. Dry pasta or rice meal packs. Vegetables Creamed or fried vegetables. Vegetables in a cheese sauce. Regular canned vegetables (not low-sodium or reduced-sodium). Regular canned tomato sauce and paste (not low-sodium or reduced-sodium). Regular tomato and vegetable juice (not low-sodium or reduced-sodium). Pickles. Olives. Fruits Canned fruit in a light or heavy syrup. Fried fruit. Fruit in cream or butter sauce. Meat and other protein foods Fatty cuts of meat. Ribs. Fried meat. Bacon. Sausage. Bologna and other processed lunch meats. Salami. Fatback. Hotdogs. Bratwurst. Salted nuts and seeds. Canned beans with added salt. Canned or smoked fish. Whole eggs or egg yolks. Chicken or turkey with skin. Dairy Whole or 2% milk, cream, and half-and-half. Whole or full-fat cream cheese. Whole-fat or sweetened yogurt. Full-fat cheese. Nondairy creamers. Whipped toppings.  Processed cheese and cheese spreads. Fats and oils Butter. Stick margarine. Lard. Shortening. Ghee. Bacon fat. Tropical oils, such as coconut, palm kernel, or palm oil. Seasoning and other foods Salted popcorn and pretzels. Onion salt, garlic salt, seasoned salt, table salt, and sea salt. Worcestershire sauce. Tartar sauce. Barbecue sauce. Teriyaki sauce. Soy sauce, including reduced-sodium. Steak sauce. Canned and packaged gravies. Fish sauce. Oyster sauce. Cocktail sauce. Horseradish that you find on the shelf. Ketchup. Mustard. Meat flavorings and tenderizers. Bouillon cubes. Hot sauce and Tabasco sauce. Premade or packaged marinades. Premade or packaged taco seasonings. Relishes. Regular salad dressings. Where to find more information:  National Heart, Lung, and Blood Institute: www.nhlbi.nih.gov  American Heart Association: www.heart.org Summary  The DASH eating plan is a healthy eating plan that has been shown to reduce high blood pressure (hypertension). It may also reduce your risk for type 2 diabetes, heart disease, and stroke.  With the DASH eating plan, you should limit salt (sodium) intake to 2,300 mg a day. If you have hypertension, you may need to reduce your sodium intake to 1,500 mg a day.  When on the DASH eating plan, aim to eat more fresh fruits and vegetables, whole grains, lean proteins, low-fat dairy, and heart-healthy fats.  Work with your health care provider or diet and nutrition specialist (dietitian) to adjust your eating plan to your   individual calorie needs. This information is not intended to replace advice given to you by your health care provider. Make sure you discuss any questions you have with your health care provider. Document Revised: 07/11/2017 Document Reviewed: 07/22/2016 Elsevier Patient Education  2020 Elsevier Inc.  

## 2020-07-10 NOTE — Progress Notes (Addendum)
   Subjective:    Patient ID: Wendy Barajas, female    DOB: [birth year], 79 y.o.   MRN: [id number]  Chief Complaint: bilateral feel swelling (Kidney Dr changed BP meds about 1 month ago) and Headache   HPI Patient come sin today c/o swelling. Her kidney doctor changed her blood pressure meds amonth ago. Her creatine was 1.73 and was referred to nephrology. They chenged her blood pressure meds from losartan 100/25 1 daily to amlodipine $RemoveBefor'5mg'onAATiYSgMlT$  daily. Since then she has had lower ext edema and headaches. She does not check her blood pressure at home.    Review of Systems  Constitutional: Negative for diaphoresis.  Eyes: Negative for pain.  Respiratory: Negative for shortness of breath.   Cardiovascular: Negative for chest pain, palpitations and leg swelling.  Gastrointestinal: Negative for abdominal pain.  Endocrine: Negative for polydipsia.  Skin: Negative for rash.  Neurological: Negative for dizziness, weakness and headaches.  Hematological: Does not bruise/bleed easily.  All other systems reviewed and are negative.      Objective:   Physical Exam Vitals and nursing note reviewed.  Constitutional:      Appearance: She is well-developed.  Cardiovascular:     Heart sounds: Normal heart sounds.  Pulmonary:     Effort: Pulmonary effort is normal.     Breath sounds: Normal breath sounds.  Abdominal:     General: Bowel sounds are normal.  Neurological:     Mental Status: She is alert.       BP (!) 153/87   Pulse 83   Temp (!) 97.1 F (36.2 C) (Temporal)   Resp 20   Ht $R'5\' 3"'Um$  (1.6 m)   Wt 182 lb (82.6 kg)   SpO2 99%   BMI 32.24 kg/m  .    Assessment & Plan:  Wendy Barajas in today with chief complaint of bilateral feel swelling (Kidney Dr changed BP meds about 1 month ago) and Headache   1. Essential hypertension Decrease almodipine to 1/2 tablet daily- is swelling doe snot improve stop cpmpletely Add back losartan $RemoveBefor'50mg'odxBNkgcxpVC$  daily Keep check of bblood pressure when you  can. Follow up with PCP in 2-3 weeks. - losartan (COZAAR) 50 MG tablet; Take 1 tablet (50 mg total) by mouth daily.  Dispense: 90 tablet; Refill: 3 - BMP8+EGFR - amLODipine (NORVASC) 5 MG tablet; Take 0.5 tablets (2.5 mg total) by mouth daily.  Dispense: 90 tablet; Refill: 3  * Consulted with clinical pharmacist  The above assessment and management plan was discussed with the patient. The patient verbalized understanding of and has agreed to the management plan. Patient is aware to call the clinic if symptoms persist or worsen. Patient is aware when to return to the clinic for a follow-up visit. Patient educated on when it is appropriate to go to the emergency department.   Mary-Margaret Hassell Done, FNP

## 2020-07-11 ENCOUNTER — Telehealth: Payer: Self-pay

## 2020-07-11 NOTE — Telephone Encounter (Signed)
  Prescription Request  07/11/2020  What is the name of the medication or equipment? B/p meds?  Have you contacted your pharmacy to request a refill? (if applicable) yes  Which pharmacy would you like this sent to? [facility]   Patient notified that their request is being sent to the clinical staff for review and that they should receive a response within 2 business days.   Gottschalk's pt.  She is completely out.  New RX?  Please call pt.  She is at Pharmacy now, but I told her to go home & let us call her, when ready.

## 2020-07-12 NOTE — Telephone Encounter (Signed)
NVM setup on cell phone, NA at home phone Medications, Amlodipine & Losartan on 07/10/20 were sent to [place] [facility] If patient would like prescriptions to stay with mail order pharmacy a 30 day supply can be sent to [place]

## 2020-07-14 ENCOUNTER — Telehealth: Payer: Self-pay

## 2020-07-14 DIAGNOSIS — I10 Essential (primary) hypertension: Secondary | ICD-10-CM

## 2020-07-14 MED ORDER — LOSARTAN POTASSIUM 50 MG PO TABS: 50.0000 mg | ORAL_TABLET | Freq: Every day | ORAL | 0 refills | Status: DC

## 2020-07-14 NOTE — Telephone Encounter (Signed)
NA/NVM 30 day supply sent to [facility]

## 2020-07-14 NOTE — Telephone Encounter (Signed)
Pt called stating that she has been out of her BP medicine for almost a week and has not received her BP meds through [employer] yet. Wants to know if emergency refills can be sent to [facility] to last her until she gets her Rx's in the mail.  Please advise and call pt.

## 2020-07-25 ENCOUNTER — Ambulatory Visit (INDEPENDENT_AMBULATORY_CARE_PROVIDER_SITE_OTHER): Payer: Medicare HMO | Admitting: Family Medicine

## 2020-07-25 ENCOUNTER — Other Ambulatory Visit: Payer: Self-pay

## 2020-07-25 ENCOUNTER — Encounter: Payer: Self-pay | Admitting: Family Medicine

## 2020-07-25 VITALS — BP 147/76 | HR 78 | Temp 97.1°F | Ht 63.0 in | Wt 181.0 lb

## 2020-07-25 DIAGNOSIS — N1832 Chronic kidney disease, stage 3b: Secondary | ICD-10-CM

## 2020-07-25 DIAGNOSIS — R600 Localized edema: Secondary | ICD-10-CM | POA: Diagnosis not present

## 2020-07-25 DIAGNOSIS — I10 Essential (primary) hypertension: Secondary | ICD-10-CM

## 2020-07-25 NOTE — Progress Notes (Signed)
Subjective: CC: Follow-up hypertension, edema PCP: [name], DO DVV:[id number] Morning is a 79 y.o. female presenting to clinic today for:  1.  Hypertension with CKD 3B/ 4 Patient reports that she has been compliant with half tablet of Norvasc and 50 mg of losartan.  Not checking blood pressures at home because she cannot read her cuff.  She reports ongoing edema that is unchanged since reduction of Norvasc.  She admits that she does eat some salt, often having a sandwich for lunch and supper.  These typically consist of pimento cheese or chicken salad.  No chest pain, shortness of breath but she has been experiencing intermittent headaches.  Not currently using any compression hose.  She was seen by renal but would like to see if she can get something closer.  ROS: Per HPI  Allergies  Allergen Reactions   Pollen Extract    Past Medical History:  Diagnosis Date   Allergy    Arthritis    bone spurs - bilateral knee issues    GERD (gastroesophageal reflux disease)    Hyperlipidemia    Hypertension     Current Outpatient Medications:    amLODipine (NORVASC) 5 MG tablet, Take 0.5 tablets (2.5 mg total) by mouth daily., Disp: 90 tablet, Rfl: 3   calcium carbonate (OS-CAL - DOSED IN MG OF ELEMENTAL CALCIUM) 1250 (500 Ca) MG tablet, Take 1 tablet by mouth., Disp: , Rfl:    Cholecalciferol (VITAMIN D3) 400 UNITS CAPS, Take 1 capsule by mouth daily. , Disp: , Rfl:    loratadine (CLARITIN) 10 MG tablet, TAKE 1 TABLET EVERY DAY, Disp: 90 tablet, Rfl: 1   losartan (COZAAR) 50 MG tablet, Take 1 tablet (50 mg total) by mouth daily., Disp: 30 tablet, Rfl: 0   nystatin cream (MYCOSTATIN), Apply 1 application topically 2 (two) times daily. To the groin areas x7-14 days per flare., Disp: 30 g, Rfl: 0   RABEprazole (ACIPHEX) 20 MG tablet, Take 1 tablet (20 mg total) by mouth daily., Disp: 90 tablet, Rfl: 3   simvastatin (ZOCOR) 20 MG tablet, Take 1 tablet (20 mg total) by mouth  daily., Disp: 90 tablet, Rfl: 3 Social History   Socioeconomic History   Marital status: Widowed    Spouse name: Not on file   Number of children: 1   Years of education: Not on file   Highest education level: 11th grade  Occupational History   Occupation: Retired   Tobacco Use   Smoking status: Never Smoker   Smokeless tobacco: Never Used  Scientific laboratory technician Use: Never used  Substance and Sexual Activity   Alcohol use: No    Alcohol/week: 0.0 standard drinks   Drug use: No   Sexual activity: Not Currently  Other Topics Concern   Not on file  Social History Narrative   Live alone    Social Determinants of Health   Financial Resource Strain: Not on file  Food Insecurity: Not on file  Transportation Needs: Not on file  Physical Activity: Not on file  Stress: Not on file  Social Connections: Not on file  Intimate Partner Violence: Not on file   Family History  Problem Relation Age of Onset   Cancer Father        unsure = thinks colon    Diabetes Mother    Heart disease Mother    Cancer Brother        unsure    Stroke Brother    Arthritis Sister  back issues     Objective: Office vital signs reviewed. BP (!) 147/76    Pulse 78    Temp (!) 97.1 F (36.2 C) (Temporal)    Ht 5\' 3"  (1.6 m)    Wt 181 lb (82.1 kg)    SpO2 98%    BMI 32.06 kg/m   Physical Examination:  General: Awake, alert, No acute distress Cardio: regular rate and rhythm, S1S2 heard, no murmurs appreciated Pulm: clear to auscultation bilaterally, no wheezes, rhonchi or rales; normal work of breathing on room air Extremities: warm, well perfused, trace pitting edema noted to ankles.  Assessment/ Plan: 79 y.o. female   Essential hypertension - Plan: Renal Function Panel  Chronic kidney disease, stage 3b ([place]) - Plan: Renal Function Panel  Bilateral lower extremity edema - Plan: Compression stockings  Chronically blood pressures controlled for age but given impaired  renal function I think she would deserve tighter control.  Would prefer to see systolics in the 408X.  For now continue current regimen.  I am having her cut back on salt.  DASH diet recommendations reviewed and a handout was provided  I have given her prescription for compression stockings to help with bilateral lower extremity edema she is experiencing.  We discussed that the ARB may need to be discontinued again as I think this class was previously discontinued by renal  Check renal function panel.  We will see if we can get her into Dr. Theador Hawthorne instead since this would be more convenient drive wise for her.  I would like to see her back in 6 weeks for recheck of her symptoms.   No orders of the defined types were placed in this encounter.  No orders of the defined types were placed in this encounter.    [name], DO [place] [phone]

## 2020-07-25 NOTE — Patient Instructions (Signed)
Goal blood pressure 130-140/70-80. New referral to Dr Theador Hawthorne (kidney doctor) sent to [place].  Loma Sousa will call you for appointment Reduce salt intake. Use compression hose for swelling  You had labs performed today.  You will be contacted with the results of the labs once they are available, usually in the next 3 business days for routine lab work.  If you have an active my chart account, they will be released to your MyChart.  If you prefer to have these labs released to you via telephone, please let us know.  If you had a pap smear or biopsy performed, expect to be contacted in about 7-10 days.   DASH Eating Plan DASH stands for "Dietary Approaches to Stop Hypertension." The DASH eating plan is a healthy eating plan that has been shown to reduce high blood pressure (hypertension). It may also reduce your risk for type 2 diabetes, heart disease, and stroke. The DASH eating plan may also help with weight loss. What are tips for following this plan?  General guidelines  Avoid eating more than 2,300 mg (milligrams) of salt (sodium) a day. If you have hypertension, you may need to reduce your sodium intake to 1,500 mg a day.  Limit alcohol intake to no more than 1 drink a day for nonpregnant women and 2 drinks a day for men. One drink equals 12 oz of beer, 5 oz of wine, or 1 oz of hard liquor.  Work with your health care provider to maintain a healthy body weight or to lose weight. Ask what an ideal weight is for you.  Get at least 30 minutes of exercise that causes your heart to beat faster (aerobic exercise) most days of the week. Activities may include walking, swimming, or biking.  Work with your health care provider or diet and nutrition specialist (dietitian) to adjust your eating plan to your individual calorie needs. Reading food labels   Check food labels for the amount of sodium per serving. Choose foods with less than 5 percent of the Daily Value of sodium. Generally, foods  with less than 300 mg of sodium per serving fit into this eating plan.  To find whole grains, look for the word "whole" as the first word in the ingredient list. Shopping  Buy products labeled as "low-sodium" or "no salt added."  Buy fresh foods. Avoid canned foods and premade or frozen meals. Cooking  Avoid adding salt when cooking. Use salt-free seasonings or herbs instead of table salt or sea salt. Check with your health care provider or pharmacist before using salt substitutes.  Do not fry foods. Cook foods using healthy methods such as baking, boiling, grilling, and broiling instead.  Cook with heart-healthy oils, such as olive, canola, soybean, or sunflower oil. Meal planning  Eat a balanced diet that includes: ? 5 or more servings of fruits and vegetables each day. At each meal, try to fill half of your plate with fruits and vegetables. ? Up to 6-8 servings of whole grains each day. ? Less than 6 oz of lean meat, poultry, or fish each day. A 3-oz serving of meat is about the same size as a deck of cards. One egg equals 1 oz. ? 2 servings of low-fat dairy each day. ? A serving of nuts, seeds, or beans 5 times each week. ? Heart-healthy fats. Healthy fats called Omega-3 fatty acids are found in foods such as flaxseeds and coldwater fish, like sardines, salmon, and mackerel.  Limit how much you eat of  the following: ? Canned or prepackaged foods. ? Food that is high in trans fat, such as fried foods. ? Food that is high in saturated fat, such as fatty meat. ? Sweets, desserts, sugary drinks, and other foods with added sugar. ? Full-fat dairy products.  Do not salt foods before eating.  Try to eat at least 2 vegetarian meals each week.  Eat more home-cooked food and less restaurant, buffet, and fast food.  When eating at a restaurant, ask that your food be prepared with less salt or no salt, if possible. What foods are recommended? The items listed may not be a complete  list. Talk with your dietitian about what dietary choices are best for you. Grains Whole-grain or whole-wheat bread. Whole-grain or whole-wheat pasta. Brown rice. [place]row. Bulgur. Whole-grain and low-sodium cereals. Pita bread. Low-fat, low-sodium crackers. Whole-wheat flour tortillas. Vegetables Fresh or frozen vegetables (raw, steamed, roasted, or grilled). Low-sodium or reduced-sodium tomato and vegetable juice. Low-sodium or reduced-sodium tomato sauce and tomato paste. Low-sodium or reduced-sodium canned vegetables. Fruits All fresh, dried, or frozen fruit. Canned fruit in natural juice (without added sugar). Meat and other protein foods Skinless chicken or [place]. Ground chicken or [place]. Pork with fat trimmed off. Fish and seafood. Egg whites. Dried beans, peas, or lentils. Unsalted nuts, nut butters, and seeds. Unsalted canned beans. Lean cuts of beef with fat trimmed off. Low-sodium, lean deli meat. Dairy Low-fat (1%) or fat-free (skim) milk. Fat-free, low-fat, or reduced-fat cheeses. Nonfat, low-sodium ricotta or cottage cheese. Low-fat or nonfat yogurt. Low-fat, low-sodium cheese. Fats and oils Soft margarine without trans fats. Vegetable oil. Low-fat, reduced-fat, or light mayonnaise and salad dressings (reduced-sodium). Canola, safflower, olive, soybean, and sunflower oils. Avocado. Seasoning and other foods Herbs. Spices. Seasoning mixes without salt. Unsalted popcorn and pretzels. Fat-free sweets. What foods are not recommended? The items listed may not be a complete list. Talk with your dietitian about what dietary choices are best for you. Grains Baked goods made with fat, such as croissants, muffins, or some breads. Dry pasta or rice meal packs. Vegetables Creamed or fried vegetables. Vegetables in a cheese sauce. Regular canned vegetables (not low-sodium or reduced-sodium). Regular canned tomato sauce and paste (not low-sodium or reduced-sodium). Regular tomato and  vegetable juice (not low-sodium or reduced-sodium). Angie Fava. Olives. Fruits Canned fruit in a light or heavy syrup. Fried fruit. Fruit in cream or butter sauce. Meat and other protein foods Fatty cuts of meat. Ribs. Fried meat. Berniece Salines. Sausage. Bologna and other processed lunch meats. Salami. Fatback. Hotdogs. Bratwurst. Salted nuts and seeds. Canned beans with added salt. Canned or smoked fish. Whole eggs or egg yolks. Chicken or [place] with skin. Dairy Whole or 2% milk, cream, and half-and-half. Whole or full-fat cream cheese. Whole-fat or sweetened yogurt. Full-fat cheese. Nondairy creamers. Whipped toppings. Processed cheese and cheese spreads. Fats and oils Butter. Stick margarine. Lard. Shortening. Ghee. Bacon fat. Tropical oils, such as coconut, palm kernel, or palm oil. Seasoning and other foods Salted popcorn and pretzels. Onion salt, garlic salt, seasoned salt, table salt, and sea salt. Worcestershire sauce. Tartar sauce. Barbecue sauce. Teriyaki sauce. Soy sauce, including reduced-sodium. Steak sauce. Canned and packaged gravies. Fish sauce. Oyster sauce. Cocktail sauce. Horseradish that you find on the shelf. Ketchup. Mustard. Meat flavorings and tenderizers. Bouillon cubes. Hot sauce and Tabasco sauce. Premade or packaged marinades. Premade or packaged taco seasonings. Relishes. Regular salad dressings. Where to find more information:  National Heart, Lung, and [place]: https://wilson-eaton.com/  American Heart Association:  www.heart.org Summary  The DASH eating plan is a healthy eating plan that has been shown to reduce high blood pressure (hypertension). It may also reduce your risk for type 2 diabetes, heart disease, and stroke.  With the DASH eating plan, you should limit salt (sodium) intake to 2,300 mg a day. If you have hypertension, you may need to reduce your sodium intake to 1,500 mg a day.  When on the DASH eating plan, aim to eat more fresh fruits and vegetables, whole  grains, lean proteins, low-fat dairy, and heart-healthy fats.  Work with your health care provider or diet and nutrition specialist (dietitian) to adjust your eating plan to your individual calorie needs. This information is not intended to replace advice given to you by your health care provider. Make sure you discuss any questions you have with your health care provider. Document Revised: 07/11/2017 Document Reviewed: 07/22/2016 Elsevier Patient Education  2020 [employer].

## 2020-07-26 LAB — RENAL FUNCTION PANEL
Albumin: 4.2 g/dL (ref 3.7–4.7)
BUN/Creatinine Ratio: 15 (ref 12–28)
BUN: 21 mg/dL (ref 8–27)
CO2: 25 mmol/L (ref 20–29)
Calcium: 9.7 mg/dL (ref 8.7–10.3)
Chloride: 104 mmol/L (ref 96–106)
Creatinine, Ser: 1.44 mg/dL — ABNORMAL HIGH (ref 0.57–1.00)
GFR calc Af Amer: 40 mL/min/{1.73_m2} — ABNORMAL LOW (ref >59)
GFR calc non Af Amer: 34 mL/min/{1.73_m2} — ABNORMAL LOW (ref >59)
Glucose: 91 mg/dL (ref 65–99)
Phosphorus: 3.7 mg/dL (ref 3.0–4.3)
Potassium: 4.2 mmol/L (ref 3.5–5.2)
Sodium: 139 mmol/L (ref 134–144)

## 2020-08-21 ENCOUNTER — Other Ambulatory Visit: Payer: Self-pay

## 2020-08-21 ENCOUNTER — Ambulatory Visit (INDEPENDENT_AMBULATORY_CARE_PROVIDER_SITE_OTHER): Payer: Medicare HMO | Admitting: Family Medicine

## 2020-08-21 ENCOUNTER — Other Ambulatory Visit (HOSPITAL_COMMUNITY): Payer: Self-pay | Admitting: Nephrology

## 2020-08-21 ENCOUNTER — Other Ambulatory Visit: Payer: Self-pay | Admitting: Nephrology

## 2020-08-21 ENCOUNTER — Telehealth: Payer: Self-pay | Admitting: Family Medicine

## 2020-08-21 ENCOUNTER — Ambulatory Visit: Payer: Medicare HMO | Admitting: Family Medicine

## 2020-08-21 DIAGNOSIS — R110 Nausea: Secondary | ICD-10-CM

## 2020-08-21 DIAGNOSIS — R519 Headache, unspecified: Secondary | ICD-10-CM

## 2020-08-21 DIAGNOSIS — E785 Hyperlipidemia, unspecified: Secondary | ICD-10-CM | POA: Diagnosis not present

## 2020-08-21 DIAGNOSIS — R531 Weakness: Secondary | ICD-10-CM | POA: Diagnosis not present

## 2020-08-21 DIAGNOSIS — I10 Essential (primary) hypertension: Secondary | ICD-10-CM

## 2020-08-21 DIAGNOSIS — K219 Gastro-esophageal reflux disease without esophagitis: Secondary | ICD-10-CM | POA: Diagnosis not present

## 2020-08-21 DIAGNOSIS — N184 Chronic kidney disease, stage 4 (severe): Secondary | ICD-10-CM

## 2020-08-21 DIAGNOSIS — Z79899 Other long term (current) drug therapy: Secondary | ICD-10-CM | POA: Diagnosis not present

## 2020-08-21 DIAGNOSIS — U071 COVID-19: Secondary | ICD-10-CM | POA: Diagnosis not present

## 2020-08-21 NOTE — Telephone Encounter (Signed)
FYI this was the walk in today

## 2020-08-21 NOTE — Telephone Encounter (Signed)
Patient walked in wanting Her BP checked checked in right arm it was 160/83 P96 then waited checked again 162/80. Patient complaining of headache body aches and some chills and nausea. States she was not sure of fever checked in office 98.6. patient has televisit scheduled with you at 3:15. Went a head and swabbed for COVID since she was here so she would not have to come back let me know if you put in an order or if I should throw away.

## 2020-08-21 NOTE — Progress Notes (Signed)
Telephone visit  Subjective: CC: headache PCP: [name], DO [id number] Langbehn is a 80 y.o. female calls for telephone consult today. Patient provides verbal consent for consult held via phone.  Due to COVID-19 pandemic this visit was conducted virtually. This visit type was conducted due to national recommendations for restrictions regarding the COVID-19 Pandemic (e.g. social distancing, sheltering in place) in an effort to limit this patient's exposure and mitigate transmission in our community. All issues noted in this document were discussed and addressed.  A physical exam was not performed with this format.   Location of patient: home Location of provider: [facility] Others present for call: none  1. Headache Patient reports onset of severe headache on left side that started this morning.  She reports BP is high but cannot remember what it was.  She reports nausea without vomiting that onset after headache.  No problems with balance, vision, word finding. No weakness, numbness or tingling.  No history of migraine headaches.  Took losartan and amlodipine last night.  She just took Tylenol about 20 minutes ago but it has not helped at all.  She would like to be seen at [facility] emergency department and will have her son bring her.  ROS: Per HPI  Allergies  Allergen Reactions  . Pollen Extract    Past Medical History:  Diagnosis Date  . Allergy   . Arthritis    bone spurs - bilateral knee issues   . GERD (gastroesophageal reflux disease)   . Hyperlipidemia   . Hypertension     Current Outpatient Medications:  .  amLODipine (NORVASC) 5 MG tablet, Take 0.5 tablets (2.5 mg total) by mouth daily., Disp: 90 tablet, Rfl: 3 .  calcium carbonate (OS-CAL - DOSED IN MG OF ELEMENTAL CALCIUM) 1250 (500 Ca) MG tablet, Take 1 tablet by mouth., Disp: , Rfl:  .  Cholecalciferol (VITAMIN D3) 400 UNITS CAPS, Take 1 capsule by mouth daily. , Disp: , Rfl:  .  loratadine (CLARITIN) 10 MG  tablet, TAKE 1 TABLET EVERY DAY, Disp: 90 tablet, Rfl: 1 .  losartan (COZAAR) 50 MG tablet, Take 1 tablet (50 mg total) by mouth daily., Disp: 30 tablet, Rfl: 0 .  nystatin cream (MYCOSTATIN), Apply 1 application topically 2 (two) times daily. To the groin areas x7-14 days per flare., Disp: 30 g, Rfl: 0 .  RABEprazole (ACIPHEX) 20 MG tablet, Take 1 tablet (20 mg total) by mouth daily., Disp: 90 tablet, Rfl: 3 .  simvastatin (ZOCOR) 20 MG tablet, Take 1 tablet (20 mg total) by mouth daily., Disp: 90 tablet, Rfl: 3  Assessment/ Plan: 80 y.o. female   Severe headache  Nausea  Very concerned for hypertensive emergency versus aneurysm given reports of severe headache that was sudden onset and now has associated nausea and high blood pressure.  No known history of migraine headaches.  Medical history is significant for hypertensive kidney disease, stage III.  I have highly recommended that she seek immediate medical attention emergency department.  Most certainly would need a CAT scan.  I have called [facility] emergency department and spoken to the triage nurse that there who is aware of my concerns and patient's arrival by private vehicle.  Start time: 1:06pm End time: 1:17pm  Total time spent on patient care (including telephone call/ virtual visit): 11 minutes  [place], [place] [phone]

## 2020-08-21 NOTE — Telephone Encounter (Signed)
Patient sent urgently to the emergency department.  I am quite concerned about possible hypertensive emergency of given uncontrolled blood pressures and sudden onset of headache.  She describes the headache as severe and therefore we cannot rule out brain injury and/or aneurysm at this point.  I have already contacted the emergency department at [facility] per patient's request

## 2020-08-21 NOTE — Progress Notes (Signed)
BP was checked made televisit with Dr. Darnell Level today

## 2020-08-23 ENCOUNTER — Telehealth: Payer: Self-pay

## 2020-09-05 ENCOUNTER — Ambulatory Visit (INDEPENDENT_AMBULATORY_CARE_PROVIDER_SITE_OTHER): Payer: Medicare HMO | Admitting: Family Medicine

## 2020-09-05 ENCOUNTER — Encounter: Payer: Self-pay | Admitting: Family Medicine

## 2020-09-05 ENCOUNTER — Other Ambulatory Visit: Payer: Self-pay

## 2020-09-05 VITALS — BP 131/76 | HR 81 | Temp 96.8°F | Resp 20 | Ht 63.0 in | Wt 178.0 lb

## 2020-09-05 DIAGNOSIS — E7800 Pure hypercholesterolemia, unspecified: Secondary | ICD-10-CM

## 2020-09-05 DIAGNOSIS — N1832 Chronic kidney disease, stage 3b: Secondary | ICD-10-CM | POA: Diagnosis not present

## 2020-09-05 DIAGNOSIS — I10 Essential (primary) hypertension: Secondary | ICD-10-CM

## 2020-09-05 DIAGNOSIS — K219 Gastro-esophageal reflux disease without esophagitis: Secondary | ICD-10-CM

## 2020-09-05 MED ORDER — AMLODIPINE BESYLATE 2.5 MG PO TABS: 2.5000 mg | ORAL_TABLET | Freq: Every day | ORAL | 3 refills | Status: DC

## 2020-09-05 MED ORDER — LOSARTAN POTASSIUM 50 MG PO TABS: 50.0000 mg | ORAL_TABLET | Freq: Every day | ORAL | 3 refills | Status: DC

## 2020-09-05 MED ORDER — SIMVASTATIN 20 MG PO TABS: 20.0000 mg | ORAL_TABLET | Freq: Every day | ORAL | 3 refills | Status: DC

## 2020-09-05 MED ORDER — RABEPRAZOLE SODIUM 20 MG PO TBEC
20.0000 mg | DELAYED_RELEASE_TABLET | Freq: Every day | ORAL | 3 refills | Status: DC
Start: 2020-09-05 — End: 2021-09-07

## 2020-09-05 NOTE — Patient Instructions (Signed)

## 2020-09-05 NOTE — Progress Notes (Signed)
Subjective: CC: Hypertension with CKD PCP: [name], DO [id number] Wendy Barajas is a 80 y.o. female presenting to clinic today for:  1.  Hypertension with CKD/hyperlipidemia Patient reports compliance with Norvasc 2.5 mg daily, losartan 50 mg daily and Zocor 20 mg daily.  Denies any chest pain, shortness of breath, dizziness, edema or falls.  2.  GERD Patient reports good control of GERD with AcipHex.  Denies any melena, hematochezia, nausea or vomiting.  Appetite is good   ROS: Per HPI  Allergies  Allergen Reactions  . Pollen Extract    Past Medical History:  Diagnosis Date  . Allergy   . Arthritis    bone spurs - bilateral knee issues   . GERD (gastroesophageal reflux disease)   . Hyperlipidemia   . Hypertension     Current Outpatient Medications:  .  amLODipine (NORVASC) 5 MG tablet, Take 0.5 tablets (2.5 mg total) by mouth daily., Disp: 90 tablet, Rfl: 3 .  calcium carbonate (OS-CAL - DOSED IN MG OF ELEMENTAL CALCIUM) 1250 (500 Ca) MG tablet, Take 1 tablet by mouth., Disp: , Rfl:  .  Cholecalciferol (VITAMIN D3) 400 UNITS CAPS, Take 1 capsule by mouth daily. , Disp: , Rfl:  .  loratadine (CLARITIN) 10 MG tablet, TAKE 1 TABLET EVERY DAY, Disp: 90 tablet, Rfl: 1 .  losartan (COZAAR) 50 MG tablet, Take 1 tablet (50 mg total) by mouth daily., Disp: 30 tablet, Rfl: 0 .  nystatin cream (MYCOSTATIN), Apply 1 application topically 2 (two) times daily. To the groin areas x7-14 days per flare., Disp: 30 g, Rfl: 0 .  RABEprazole (ACIPHEX) 20 MG tablet, Take 1 tablet (20 mg total) by mouth daily., Disp: 90 tablet, Rfl: 3 .  simvastatin (ZOCOR) 20 MG tablet, Take 1 tablet (20 mg total) by mouth daily., Disp: 90 tablet, Rfl: 3 Social History   Socioeconomic History  . Marital status: Widowed    Spouse name: Not on file  . Number of children: 1  . Years of education: Not on file  . Highest education level: 11th grade  Occupational History  . Occupation: Retired   Tobacco  Use  . Smoking status: Never Smoker  . Smokeless tobacco: Never Used  Vaping Use  . Vaping Use: Never used  Substance and Sexual Activity  . Alcohol use: No    Alcohol/week: 0.0 standard drinks  . Drug use: No  . Sexual activity: Not Currently  Other Topics Concern  . Not on file  Social History Narrative   Live alone    Social Determinants of Health   Financial Resource Strain: Not on file  Food Insecurity: Not on file  Transportation Needs: Not on file  Physical Activity: Not on file  Stress: Not on file  Social Connections: Not on file  Intimate Partner Violence: Not on file   Family History  Problem Relation Age of Onset  . Cancer Father        unsure = thinks colon   . Diabetes Mother   . Heart disease Mother   . Cancer Brother        unsure   . Stroke Brother   . Arthritis Sister        back issues     Objective: Office vital signs reviewed. BP 131/76   Pulse 81   Temp (!) 96.8 F (36 C)   Resp 20   Ht '5\' 3"'$  (1.6 m)   Wt 178 lb (80.7 kg)   SpO2 97%  BMI 31.53 kg/m   Physical Examination:  General: Awake, alert, well nourished, No acute distress HEENT: Normal; sclera white.  Moist mucous membranes Cardio: regular rate and rhythm, S1S2 heard, no murmurs appreciated Pulm: clear to auscultation bilaterally, no wheezes, rhonchi or rales; normal work of breathing on room air Extremities: warm, well perfused, No edema, cyanosis or clubbing; +2 pulses bilaterally MSK: Ambulating independently.  Normal tone  Assessment/ Plan: 80 y.o. female   Essential hypertension - Plan: Renal Function Panel, amLODipine (NORVASC) 2.5 MG tablet, losartan (COZAAR) 50 MG tablet  Chronic kidney disease, stage 3b (HCC) - Plan: Renal Function Panel, VITAMIN D 25 Hydroxy (Vit-D Deficiency, Fractures), CBC  Pure hypercholesterolemia - Plan: Lipid Panel, simvastatin (ZOCOR) 20 MG tablet  Gastroesophageal reflux disease without esophagitis - Plan: RABEprazole (ACIPHEX) 20 MG  tablet  Blood pressures well controlled.  Continue current regimen.  The Norvasc 2.5 mg tablets have been sent to her pharmacy  Check renal function panel, vitamin D and CBC given CKD 3B  Zocor has been renewed.  Check fasting lipid panel  GERD stable.  AcipHex renewed  No orders of the defined types were placed in this encounter.  No orders of the defined types were placed in this encounter.    [name], DO [place] [phone]

## 2020-09-06 LAB — RENAL FUNCTION PANEL
Albumin: 4.1 g/dL (ref 3.7–4.7)
BUN/Creatinine Ratio: 15 (ref 12–28)
BUN: 21 mg/dL (ref 8–27)
CO2: 22 mmol/L (ref 20–29)
Calcium: 9.7 mg/dL (ref 8.7–10.3)
Chloride: 105 mmol/L (ref 96–106)
Creatinine, Ser: 1.39 mg/dL — ABNORMAL HIGH (ref 0.57–1.00)
GFR calc Af Amer: 41 mL/min/{1.73_m2} — ABNORMAL LOW (ref >59)
GFR calc non Af Amer: 36 mL/min/{1.73_m2} — ABNORMAL LOW (ref >59)
Glucose: 112 mg/dL — ABNORMAL HIGH (ref 65–99)
Phosphorus: 4 mg/dL (ref 3.0–4.3)
Potassium: 4.4 mmol/L (ref 3.5–5.2)
Sodium: 143 mmol/L (ref 134–144)

## 2020-09-06 LAB — CBC
Hematocrit: 36.5 % (ref 34.0–46.6)
Hemoglobin: 11.9 g/dL (ref 11.1–15.9)
MCH: 29.1 pg (ref 26.6–33.0)
MCHC: 32.6 g/dL (ref 31.5–35.7)
MCV: 89 fL (ref 79–97)
Platelets: 272 10*3/uL (ref 150–450)
RBC: 4.09 x10E6/uL (ref 3.77–5.28)
RDW: 12.9 % (ref 11.7–15.4)
WBC: 7.5 10*3/uL (ref 3.4–10.8)

## 2020-09-06 LAB — LIPID PANEL
Chol/HDL Ratio: 4.3 ratio (ref 0.0–4.4)
Cholesterol, Total: 176 mg/dL (ref 100–199)
HDL: 41 mg/dL (ref >39)
LDL Chol Calc (NIH): 109 mg/dL — ABNORMAL HIGH (ref 0–99)
Triglycerides: 148 mg/dL (ref 0–149)
VLDL Cholesterol Cal: 26 mg/dL (ref 5–40)

## 2020-09-06 LAB — VITAMIN D 25 HYDROXY (VIT D DEFICIENCY, FRACTURES): Vit D, 25-Hydroxy: 60.5 ng/mL (ref 30.0–100.0)

## 2020-10-03 ENCOUNTER — Ambulatory Visit: Payer: Medicare HMO | Admitting: Family Medicine

## 2020-10-04 ENCOUNTER — Other Ambulatory Visit: Payer: Self-pay

## 2020-10-04 ENCOUNTER — Ambulatory Visit (HOSPITAL_COMMUNITY)
Admission: RE | Admit: 2020-10-04 | Discharge: 2020-10-04 | Disposition: A | Discharge status: Home / Self Care | Payer: Medicare HMO | Source: Ambulatory Visit | Attending: Nephrology | Admitting: Nephrology

## 2020-10-04 DIAGNOSIS — N184 Chronic kidney disease, stage 4 (severe): Secondary | ICD-10-CM | POA: Insufficient documentation

## 2020-10-12 ENCOUNTER — Other Ambulatory Visit: Payer: Self-pay | Admitting: Family Medicine

## 2021-01-01 ENCOUNTER — Other Ambulatory Visit: Payer: Self-pay

## 2021-01-01 ENCOUNTER — Encounter: Payer: Self-pay | Admitting: Family Medicine

## 2021-01-01 ENCOUNTER — Ambulatory Visit (INDEPENDENT_AMBULATORY_CARE_PROVIDER_SITE_OTHER): Payer: Medicare HMO | Admitting: Family Medicine

## 2021-01-01 VITALS — BP 148/76 | HR 74 | Temp 97.6°F | Ht 63.0 in | Wt 175.1 lb

## 2021-01-01 DIAGNOSIS — M25561 Pain in right knee: Secondary | ICD-10-CM | POA: Diagnosis not present

## 2021-01-01 DIAGNOSIS — G8929 Other chronic pain: Secondary | ICD-10-CM | POA: Diagnosis not present

## 2021-01-01 DIAGNOSIS — M25562 Pain in left knee: Secondary | ICD-10-CM | POA: Diagnosis not present

## 2021-01-01 MED ORDER — PREDNISONE 10 MG (21) PO TBPK: ORAL_TABLET | ORAL | 0 refills | Status: DC

## 2021-01-01 NOTE — Progress Notes (Signed)
Acute Office Visit  Subjective:    Patient ID: Wendy Barajas, female    DOB: 1940/10/06, 80 y.o.   MRN: [id number]  Chief Complaint  Patient presents with  . Knee Pain    HPI Patient is in today for chronic knee pain. She reports that that for over a week her knee pain has been worse. The pain in her right knee is an 8/10. The pain in her right knee has been constant for the last week. In her left knee the pain occurs with movement. The pain is achy. Also reports intermittent swelling of her knees. She denies injury, fever, warmth, or erythema. Reports history of arthritis in her knees. She has tried creams without improvement. She has taken tylenol without improvement. She has had injections in the past that helped for awhile. It has been over 3 months since she had an injection.   Past Medical History:  Diagnosis Date  . Allergy   . Arthritis    bone spurs - bilateral knee issues   . GERD (gastroesophageal reflux disease)   . Hyperlipidemia   . Hypertension     Past Surgical History:  Procedure Laterality Date  . ABDOMINAL HYSTERECTOMY    . COLONOSCOPY    . EYE SURGERY Bilateral    cataract removal     Family History  Problem Relation Age of Onset  . Cancer Father        unsure = thinks colon   . Diabetes Mother   . Heart disease Mother   . Cancer Brother        unsure   . Stroke Brother   . Arthritis Sister        back issues     Social History   Socioeconomic History  . Marital status: Widowed    Spouse name: Not on file  . Number of children: 1  . Years of education: Not on file  . Highest education level: 11th grade  Occupational History  . Occupation: Retired   Tobacco Use  . Smoking status: Never Smoker  . Smokeless tobacco: Never Used  Vaping Use  . Vaping Use: Never used  Substance and Sexual Activity  . Alcohol use: No    Alcohol/week: 0.0 standard drinks  . Drug use: No  . Sexual activity: Not Currently  Other Topics Concern  . Not on  file  Social History Narrative   Live alone    Social Determinants of Health   Financial Resource Strain: Not on file  Food Insecurity: Not on file  Transportation Needs: Not on file  Physical Activity: Not on file  Stress: Not on file  Social Connections: Not on file  Intimate Partner Violence: Not on file    Outpatient Medications Prior to Visit  Medication Sig Dispense Refill  . amLODipine (NORVASC) 2.5 MG tablet Take 1 tablet (2.5 mg total) by mouth daily. Discontinue '5mg'$  tablet 90 tablet 3  . amLODipine (NORVASC) 5 MG tablet     . calcium carbonate (OS-CAL - DOSED IN MG OF ELEMENTAL CALCIUM) 1250 (500 Ca) MG tablet Take 1 tablet by mouth.    . Cholecalciferol (VITAMIN D3) 400 UNITS CAPS Take 1 capsule by mouth daily.     Marland Kitchen loratadine (CLARITIN) 10 MG tablet TAKE 1 TABLET EVERY DAY 90 tablet 3  . losartan (COZAAR) 50 MG tablet Take 1 tablet (50 mg total) by mouth daily. 90 tablet 3  . nystatin cream (MYCOSTATIN) Apply 1 application topically 2 (two) times daily. To  the groin areas x7-14 days per flare. 30 g 0  . RABEprazole (ACIPHEX) 20 MG tablet Take 1 tablet (20 mg total) by mouth daily. 90 tablet 3  . simvastatin (ZOCOR) 20 MG tablet Take 1 tablet (20 mg total) by mouth daily. 90 tablet 3   No facility-administered medications prior to visit.    Allergies  Allergen Reactions  . Pollen Extract     Review of Systems As per HPI.     Objective:    Physical Exam Vitals and nursing note reviewed.  Constitutional:      Appearance: Normal appearance.  Musculoskeletal:     Right knee: No swelling, deformity, effusion, erythema or bony tenderness. No tenderness.     Left knee: No swelling, deformity, effusion, erythema or bony tenderness. No tenderness.  Skin:    General: Skin is warm and dry.  Neurological:     General: No focal deficit present.     Mental Status: She is alert and oriented to person, place, and time.  Psychiatric:        Mood and Affect: Mood  normal.        Behavior: Behavior normal.     BP (!) 148/76   Pulse 74   Temp 97.6 F (36.4 C) (Temporal)   Ht '5\' 3"'$  (1.6 m)   Wt 175 lb 2 oz (79.4 kg)   BMI 31.02 kg/m  Wt Readings from Last 3 Encounters:  01/01/21 175 lb 2 oz (79.4 kg)  09/05/20 178 lb (80.7 kg)  07/25/20 181 lb (82.1 kg)    Health Maintenance Due  Topic Date Due  . COVID-19 Vaccine (1) Never done    There are no preventive care reminders to display for this patient.   Lab Results  Component Value Date   TSH 3.220 02/29/2020   Lab Results  Component Value Date   WBC 7.5 09/05/2020   HGB 11.9 09/05/2020   HCT 36.5 09/05/2020   MCV 89 09/05/2020   PLT 272 09/05/2020   Lab Results  Component Value Date   NA 143 09/05/2020   K 4.4 09/05/2020   CO2 22 09/05/2020   GLUCOSE 112 (H) 09/05/2020   BUN 21 09/05/2020   CREATININE 1.39 (H) 09/05/2020   BILITOT 0.8 02/29/2020   ALKPHOS 104 02/29/2020   AST 14 02/29/2020   ALT 9 02/29/2020   PROT 7.3 02/29/2020   ALBUMIN 4.1 09/05/2020   CALCIUM 9.7 09/05/2020   Lab Results  Component Value Date   CHOL 176 09/05/2020   Lab Results  Component Value Date   HDL 41 09/05/2020   Lab Results  Component Value Date   LDLCALC 109 (H) 09/05/2020   Lab Results  Component Value Date   TRIG 148 09/05/2020   Lab Results  Component Value Date   CHOLHDL 4.3 09/05/2020   Lab Results  Component Value Date   HGBA1C 6.0 02/07/2017       Assessment & Plan:   Kallyn was seen today for knee pain.  Diagnoses and all orders for this visit:  Bilateral chronic knee pain Chronic with recent worsening. History of arthritis. Denies injury. Declined knee injections today, declined ortho referral today. Prednisone pack as below. Return to office for new or worsening symptoms, or if symptoms persist.  -     predniSONE (STERAPRED UNI-PAK 21 TAB) 10 MG (21) TBPK tablet; Use as directed on back of pill pack  Return to office for new or worsening symptoms,  or if symptoms persist.  The patient indicates understanding of these issues and agrees with the plan.   Gwenlyn Perking, FNP

## 2021-01-01 NOTE — Patient Instructions (Signed)
Chronic Knee Pain, Adult Chronic knee pain is pain in one or both knees that lasts longer than 3 months. Symptoms of chronic knee pain may include swelling, stiffness, and discomfort. Age-related wear and tear (osteoarthritis) of the knee joint is the most common cause of chronic knee pain. Other possible causes include:  A long-term immune-related disease that causes inflammation of the knee (rheumatoid arthritis). This usually affects both knees.  Inflammatory arthritis, such as gout or pseudogout.  An injury to the knee that causes arthritis.  An injury to the knee that damages the ligaments. Ligaments are strong tissues that connect bones to each other.  Runner's knee or pain behind the kneecap. Treatment for chronic knee pain depends on the cause. The main treatments for chronic knee pain are physical therapy and weight loss. This condition may also be treated with medicines, injections, a knee sleeve or brace, and by using crutches. Rest, ice, pressure (compression), and elevation, also known as RICE therapy, may also be recommended. Follow these instructions at home: If you have a knee sleeve or brace:  Wear the knee sleeve or brace as told by your health care provider. Remove it only as told by your health care provider.  Loosen it if your toes tingle, become numb, or turn cold and blue.  Keep it clean.  If the sleeve or brace is not waterproof: ? Do not let it get wet. ? Remove it if allowed by your health care provider, or cover it with a watertight covering when you take a bath or a shower.   Managing pain, stiffness, and swelling  If directed, apply heat to the affected area as often as told by your health care provider. Use the heat source that your health care provider recommends, such as a moist heat pack or a heating pad. ? If you have a removable knee sleeve or brace, remove it as told by your health care provider. ? Place a towel between your skin and the heat  source. ? Leave the heat on for 20-30 minutes. ? Remove the heat if your skin turns bright red. This is especially important if you are unable to feel pain, heat, or cold. You may have a greater risk of getting burned.  If directed, put ice on the affected area. To do this: ? If you have a removable knee sleeve or brace, remove it as told by your health care provider. ? Put ice in a plastic bag. ? Place a towel between your skin and the bag. ? Leave the ice on for 20 minutes, 2-3 times a day. ? Remove the ice if your skin turns bright red. This is very important. If you cannot feel pain, heat, or cold, you have a greater risk of damage to the area.  Move your toes often to reduce stiffness and swelling.  Raise (elevate) the injured area above the level of your heart while you are sitting or lying down.      Activity  Avoid high-impact activities or exercises, such as running, jumping rope, or doing jumping jacks.  Follow the exercise plan that your health care provider designed for you. Your health care provider may suggest that you: ? Avoid activities that make knee pain worse. This may require you to change your exercise routines, sport participation, or job duties. ? Wear shoes with cushioned soles. ? Avoid sports that require running and sudden changes in direction. ? Do physical therapy. Physical therapy is planned to match your needs and abilities.   It may include exercises for strength, flexibility, stability, and endurance. ? Do exercises that increase balance and strength, such as tai chi and yoga.  Do not use the injured limb to support your body weight until your health care provider says that you can. Use crutches as told by your health care provider.  Return to your normal activities as told by your health care provider. Ask your health care provider what activities are safe for you. General instructions  Take over-the-counter and prescription medicines only as told by  your health care provider.  Lose weight if you are overweight. Losing even a little weight can reduce knee pain. Ask your health care provider what your ideal weight is, and how to safely lose extra weight. A dietitian may be able to help you plan your meals.  Do not use any products that contain nicotine or tobacco, such as cigarettes, e-cigarettes, and chewing tobacco. These can delay healing. If you need help quitting, ask your health care provider.  Keep all follow-up visits. This is important. Contact a health care provider if:  You have knee pain that is not getting better or gets worse.  You are unable to do your physical therapy exercises due to knee pain. Get help right away if:  Your knee swells and the swelling becomes worse.  You cannot move your knee.  You have severe knee pain. Summary  Knee pain that lasts more than 3 months is considered chronic knee pain.  The main treatments for chronic knee pain are physical therapy and weight loss. You may also need to take medicines, wear a knee sleeve or brace, use crutches, and apply ice or heat.  Losing even a little weight can reduce knee pain. Ask your health care provider what your ideal weight is, and how to safely lose extra weight. A dietitian may be able to help you plan your meals.  Follow the exercise plan that your health care provider designed for you. This information is not intended to replace advice given to you by your health care provider. Make sure you discuss any questions you have with your health care provider. Document Revised: 01/12/2020 Document Reviewed: 01/12/2020 Elsevier Patient Education  2021 [employer]  

## 2021-01-19 ENCOUNTER — Other Ambulatory Visit: Payer: Self-pay

## 2021-01-19 ENCOUNTER — Ambulatory Visit (INDEPENDENT_AMBULATORY_CARE_PROVIDER_SITE_OTHER): Payer: Medicare HMO | Admitting: Nurse Practitioner

## 2021-01-19 VITALS — BP 149/87 | HR 114 | Temp 98.3°F | Ht 63.0 in | Wt 173.0 lb

## 2021-01-19 DIAGNOSIS — G8929 Other chronic pain: Secondary | ICD-10-CM

## 2021-01-19 DIAGNOSIS — M25561 Pain in right knee: Secondary | ICD-10-CM | POA: Diagnosis not present

## 2021-01-19 MED ORDER — METHYLPREDNISOLONE ACETATE 40 MG/ML IJ SUSP
40.0000 mg | Freq: Once | INTRAMUSCULAR | Status: AC
  Administered 2021-01-19: 40 mg via INTRAMUSCULAR

## 2021-01-19 MED ORDER — ZIKS ARTHRITIS PAIN RELIEF 0.025-1-12 % EX CREA: 1.0000 "application " | TOPICAL_CREAM | Freq: Four times a day (QID) | CUTANEOUS | 3 refills | Status: DC | PRN

## 2021-01-19 NOTE — Progress Notes (Signed)
Acute Office Visit  Subjective:    Patient ID: Wendy Barajas, female    DOB: 03/03/1941, 80 y.o.   MRN: [id number]  Chief Complaint  Patient presents with   Knee Pain    Knee Pain  The incident occurred more than 1 week ago. There was no injury mechanism. The pain is present in the right knee. The quality of the pain is described as aching and shooting. The pain is at a severity of 10/10. The pain is severe. The pain has been Constant since onset. Associated symptoms include an inability to bear weight. Pertinent negatives include no loss of motion, loss of sensation, numbness or tingling. She reports no foreign bodies present. The symptoms are aggravated by movement. She has tried acetaminophen and elevation for the symptoms. The treatment provided no relief.   Past Medical History:  Diagnosis Date   Allergy    Arthritis    bone spurs - bilateral knee issues    GERD (gastroesophageal reflux disease)    Hyperlipidemia    Hypertension     Past Surgical History:  Procedure Laterality Date   ABDOMINAL HYSTERECTOMY     COLONOSCOPY     EYE SURGERY Bilateral    cataract removal     Family History  Problem Relation Age of Onset   Cancer Father        unsure = thinks colon    Diabetes Mother    Heart disease Mother    Cancer Brother        unsure    Stroke Brother    Arthritis Sister        back issues     Social History   Socioeconomic History   Marital status: Widowed    Spouse name: Not on file   Number of children: 1   Years of education: Not on file   Highest education level: 11th grade  Occupational History   Occupation: Retired   Tobacco Use   Smoking status: Never   Smokeless tobacco: Never  Vaping Use   Vaping Use: Never used  Substance and Sexual Activity   Alcohol use: No    Alcohol/week: 0.0 standard drinks   Drug use: No   Sexual activity: Not Currently  Other Topics Concern   Not on file  Social History Narrative   Live alone    Social  Determinants of Health   Financial Resource Strain: Not on file  Food Insecurity: Not on file  Transportation Needs: Not on file  Physical Activity: Not on file  Stress: Not on file  Social Connections: Not on file  Intimate Partner Violence: Not on file    Outpatient Medications Prior to Visit  Medication Sig Dispense Refill   amLODipine (NORVASC) 2.5 MG tablet Take 1 tablet (2.5 mg total) by mouth daily. Discontinue '5mg'$  tablet 90 tablet 3   amLODipine (NORVASC) 5 MG tablet      calcium carbonate (OS-CAL - DOSED IN MG OF ELEMENTAL CALCIUM) 1250 (500 Ca) MG tablet Take 1 tablet by mouth.     Cholecalciferol (VITAMIN D3) 400 UNITS CAPS Take 1 capsule by mouth daily.      loratadine (CLARITIN) 10 MG tablet TAKE 1 TABLET EVERY DAY 90 tablet 3   losartan (COZAAR) 50 MG tablet Take 1 tablet (50 mg total) by mouth daily. 90 tablet 3   nystatin cream (MYCOSTATIN) Apply 1 application topically 2 (two) times daily. To the groin areas x7-14 days per flare. 30 g 0   RABEprazole (ACIPHEX)  20 MG tablet Take 1 tablet (20 mg total) by mouth daily. 90 tablet 3   simvastatin (ZOCOR) 20 MG tablet Take 1 tablet (20 mg total) by mouth daily. 90 tablet 3   predniSONE (STERAPRED UNI-PAK 21 TAB) 10 MG (21) TBPK tablet Use as directed on back of pill pack 21 tablet 0   No facility-administered medications prior to visit.    Allergies  Allergen Reactions   Pollen Extract     Review of Systems  Constitutional: Negative.   HENT: Negative.    Respiratory: Negative.    Cardiovascular: Negative.   Gastrointestinal: Negative.   Skin:  Negative for rash.  Neurological:  Negative for tingling and numbness.      Objective:    Physical Exam Vitals and nursing note reviewed.  Constitutional:      Appearance: Normal appearance.  HENT:     Head: Normocephalic.     Mouth/Throat:     Mouth: Mucous membranes are moist.  Eyes:     Conjunctiva/sclera: Conjunctivae normal.  Cardiovascular:     Rate and  Rhythm: Normal rate.     Pulses: Normal pulses.     Heart sounds: Normal heart sounds.  Pulmonary:     Effort: Pulmonary effort is normal.     Breath sounds: Normal breath sounds.  Abdominal:     General: Bowel sounds are normal.  Musculoskeletal:     Right knee: Decreased range of motion. Tenderness present.  Skin:    Findings: No rash.  Neurological:     Mental Status: She is alert and oriented to person, place, and time.  Psychiatric:        Behavior: Behavior normal.    BP (!) 149/87   Pulse (!) 114   Temp 98.3 F (36.8 C) (Temporal)   Ht '5\' 3"'$  (1.6 m)   Wt 173 lb (78.5 kg)   SpO2 97%   BMI 30.65 kg/m  Wt Readings from Last 3 Encounters:  01/19/21 173 lb (78.5 kg)  01/01/21 175 lb 2 oz (79.4 kg)  09/05/20 178 lb (80.7 kg)    Health Maintenance Due  Topic Date Due   COVID-19 Vaccine (1) Never done   Zoster Vaccines- Shingrix (1 of 2) Never done    There are no preventive care reminders to display for this patient.   Lab Results  Component Value Date   TSH 3.220 02/29/2020   Lab Results  Component Value Date   WBC 7.5 09/05/2020   HGB 11.9 09/05/2020   HCT 36.5 09/05/2020   MCV 89 09/05/2020   PLT 272 09/05/2020   Lab Results  Component Value Date   NA 143 09/05/2020   K 4.4 09/05/2020   CO2 22 09/05/2020   GLUCOSE 112 (H) 09/05/2020   BUN 21 09/05/2020   CREATININE 1.39 (H) 09/05/2020   BILITOT 0.8 02/29/2020   ALKPHOS 104 02/29/2020   AST 14 02/29/2020   ALT 9 02/29/2020   PROT 7.3 02/29/2020   ALBUMIN 4.1 09/05/2020   CALCIUM 9.7 09/05/2020   Lab Results  Component Value Date   CHOL 176 09/05/2020   Lab Results  Component Value Date   HDL 41 09/05/2020   Lab Results  Component Value Date   LDLCALC 109 (H) 09/05/2020   Lab Results  Component Value Date   TRIG 148 09/05/2020   Lab Results  Component Value Date   CHOLHDL 4.3 09/05/2020   Lab Results  Component Value Date   HGBA1C 6.0 02/07/2017  Assessment & Plan:    Problem List Items Addressed This Visit       Other   Chronic pain of right knee - Primary    Symptoms of chronic pain of right knee not new for patient.  Patient has scheduled visits with Ortho.  For reassessment and further treatment.  Patient is eligible for knee surgery/replacement.  In the past patient has received knee injections to help with pain.  Education provided to patient with printed handouts given.  Depo-Medrol 40 mg given in clinic, capsaicin cream, ice compress, and elevation. RX sent to pharmacy,  Follow up with worsening or unresolved symptoms.       Relevant Medications   Capsaicin-Menthol-Methyl Sal (CAPSAICIN-METHYL SAL-MENTHOL) 0.025-1-12 % CREA     Meds ordered this encounter  Medications   Capsaicin-Menthol-Methyl Sal (CAPSAICIN-METHYL SAL-MENTHOL) 0.025-1-12 % CREA    Sig: Apply 1 application topically 4 (four) times daily as needed.    Dispense:  56.6 g    Refill:  3    Order Specific Question:   Supervising Provider    Answer:   [name] [id number]   methylPREDNISolone acetate (DEPO-MEDROL) injection 40 mg     Ivy Lynn, NP

## 2021-01-20 ENCOUNTER — Encounter: Payer: Self-pay | Admitting: Nurse Practitioner

## 2021-01-20 NOTE — Assessment & Plan Note (Signed)
Symptoms of chronic pain of right knee not new for patient.  Patient has scheduled visits with Ortho.  For reassessment and further treatment.  Patient is eligible for knee surgery/replacement.  In the past patient has received knee injections to help with pain.  Education provided to patient with printed handouts given.  Depo-Medrol 40 mg given in clinic, capsaicin cream, ice compress, and elevation. RX sent to pharmacy,  Follow up with worsening or unresolved symptoms.

## 2021-02-02 ENCOUNTER — Ambulatory Visit (INDEPENDENT_AMBULATORY_CARE_PROVIDER_SITE_OTHER): Payer: Medicare HMO

## 2021-02-02 ENCOUNTER — Ambulatory Visit (INDEPENDENT_AMBULATORY_CARE_PROVIDER_SITE_OTHER): Payer: Medicare HMO | Admitting: Nurse Practitioner

## 2021-02-02 ENCOUNTER — Other Ambulatory Visit: Payer: Self-pay

## 2021-02-02 ENCOUNTER — Encounter: Payer: Self-pay | Admitting: Nurse Practitioner

## 2021-02-02 VITALS — BP 134/83 | HR 97 | Temp 98.0°F | Ht 63.0 in | Wt 173.0 lb

## 2021-02-02 DIAGNOSIS — M1711 Unilateral primary osteoarthritis, right knee: Secondary | ICD-10-CM | POA: Diagnosis not present

## 2021-02-02 DIAGNOSIS — G8929 Other chronic pain: Secondary | ICD-10-CM | POA: Diagnosis not present

## 2021-02-02 DIAGNOSIS — M25561 Pain in right knee: Secondary | ICD-10-CM

## 2021-02-02 DIAGNOSIS — M25562 Pain in left knee: Secondary | ICD-10-CM | POA: Diagnosis not present

## 2021-02-02 NOTE — Assessment & Plan Note (Signed)
Symptoms not well controlled.  Encourage patient to elevate knee apply ice or warm compress as tolerated, lidocaine patch, continue Voltaren or capsaicin topical gel, completed referral to orthopedic.  Bilateral knee x-ray completed results pending.   Follow-up with worsening or unresolved symptoms.

## 2021-02-02 NOTE — Patient Instructions (Signed)
Acute Knee Pain, Adult °Many things can cause knee pain. Sometimes, knee pain is sudden (acute) and may be caused by damage, swelling, or irritation of the muscles and tissues that support your knee. °The pain often goes away on its own with time and rest. If the pain does not go away, tests may be done to find out what is causing the pain. °Follow these instructions at home: °If you have a knee sleeve or brace: ° °Wear the knee sleeve or brace as told by your doctor. Take it off only as told by your doctor. °Loosen it if your toes: °Tingle. °Become numb. °Turn cold and blue. °Keep it clean. °If the knee sleeve or brace is not waterproof: °Do not let it get wet. °Cover it with a watertight covering when you take a bath or shower. °Activity °Rest your knee. °Do not do things that cause pain or make pain worse. °Avoid activities where both feet leave the ground at the same time (high-impact activities). Examples are running, jumping rope, and doing jumping jacks. °Work with a physical therapist to make a safe exercise program, as told by your doctor. °Managing pain, stiffness, and swelling ° °If told, put ice on the knee. To do this: °If you have a removable knee sleeve or brace, take it off as told by your doctor. °Put ice in a plastic bag. °Place a towel between your skin and the bag. °Leave the ice on for 20 minutes, 2-3 times a day. °Take off the ice if your skin turns bright red. This is very important. If you cannot feel pain, heat, or cold, you have a greater risk of damage to the area. °If told, use an elastic bandage to put pressure (compression) on your injured knee. °Raise your knee above the level of your heart while you are sitting or lying down. °Sleep with a pillow under your knee. °General instructions °Take over-the-counter and prescription medicines only as told by your doctor. °Do not smoke or use any products that contain nicotine or tobacco. If you need help quitting, ask your doctor. °If you are  overweight, work with your doctor and a food expert (dietitian) to set goals to lose weight. Being overweight can make your knee hurt more. °Watch for any changes in your symptoms. °Keep all follow-up visits. °Contact a doctor if: °The knee pain does not stop. °The knee pain changes or gets worse. °You have a fever along with knee pain. °Your knee is red or feels warm when you touch it. °Your knee gives out or locks up. °Get help right away if: °Your knee swells, and the swelling gets worse. °You cannot move your knee. °You have very bad knee pain that does not get better with pain medicine. °Summary °Many things can cause knee pain. The pain often goes away on its own with time and rest. °Your doctor may do tests to find out the cause of the pain. °Watch for any changes in your symptoms. Relieve your pain with rest, medicines, light activity, and use of ice. °Get help right away if you cannot move your knee or your knee pain is very bad. °This information is not intended to replace advice given to you by your health care provider. Make sure you discuss any questions you have with your health care provider. °Document Revised: 01/12/2020 Document Reviewed: 01/12/2020 °Elsevier Patient Education © 2022 [employer] ° °

## 2021-02-02 NOTE — Progress Notes (Signed)
Acute Office Visit  Subjective:    Patient ID: Wendy Barajas, female    DOB: 01/28/1941, 80 y.o.   MRN: [id number]  Chief Complaint  Patient presents with   Knee Pain   Depression    HPI Patient is in today for Pain  She reports chronic bilateral knee pain. was not an injury that may have caused the pain. The pain started a few years ago and is worsening. The pain does radiate right thigh and lower leg. The pain is described as aching, sharp, and throbbing, is severe in intensity, occurring constantly. Symptoms are worse in the: mid-day, afternoon, evening  Aggravating factors: walking Relieving factors: none.  She has tried application of ice and topical anesthetics with no relief.   ---------------------------------------------------------------------------------------------------   Past Medical History:  Diagnosis Date   Allergy    Arthritis    bone spurs - bilateral knee issues    GERD (gastroesophageal reflux disease)    Hyperlipidemia    Hypertension     Past Surgical History:  Procedure Laterality Date   ABDOMINAL HYSTERECTOMY     COLONOSCOPY     EYE SURGERY Bilateral    cataract removal     Family History  Problem Relation Age of Onset   Cancer Father        unsure = thinks colon    Diabetes Mother    Heart disease Mother    Cancer Brother        unsure    Stroke Brother    Arthritis Sister        back issues     Social History   Socioeconomic History   Marital status: Widowed    Spouse name: Not on file   Number of children: 1   Years of education: Not on file   Highest education level: 11th grade  Occupational History   Occupation: Retired   Tobacco Use   Smoking status: Never   Smokeless tobacco: Never  Vaping Use   Vaping Use: Never used  Substance and Sexual Activity   Alcohol use: No    Alcohol/week: 0.0 standard drinks   Drug use: No   Sexual activity: Not Currently  Other Topics Concern   Not on file  Social History  Narrative   Live alone    Social Determinants of Health   Financial Resource Strain: Not on file  Food Insecurity: Not on file  Transportation Needs: Not on file  Physical Activity: Not on file  Stress: Not on file  Social Connections: Not on file  Intimate Partner Violence: Not on file    Outpatient Medications Prior to Visit  Medication Sig Dispense Refill   amLODipine (NORVASC) 2.5 MG tablet Take 1 tablet (2.5 mg total) by mouth daily. Discontinue '5mg'$  tablet 90 tablet 3   amLODipine (NORVASC) 5 MG tablet      calcium carbonate (OS-CAL - DOSED IN MG OF ELEMENTAL CALCIUM) 1250 (500 Ca) MG tablet Take 1 tablet by mouth.     Capsaicin-Menthol-Methyl Sal (CAPSAICIN-METHYL SAL-MENTHOL) 0.025-1-12 % CREA Apply 1 application topically 4 (four) times daily as needed. 56.6 g 3   Cholecalciferol (VITAMIN D3) 400 UNITS CAPS Take 1 capsule by mouth daily.      loratadine (CLARITIN) 10 MG tablet TAKE 1 TABLET EVERY DAY 90 tablet 3   losartan (COZAAR) 50 MG tablet Take 1 tablet (50 mg total) by mouth daily. 90 tablet 3   nystatin cream (MYCOSTATIN) Apply 1 application topically 2 (two) times daily. To the  groin areas x7-14 days per flare. 30 g 0   RABEprazole (ACIPHEX) 20 MG tablet Take 1 tablet (20 mg total) by mouth daily. 90 tablet 3   simvastatin (ZOCOR) 20 MG tablet Take 1 tablet (20 mg total) by mouth daily. 90 tablet 3   No facility-administered medications prior to visit.    Allergies  Allergen Reactions   Pollen Extract     Review of Systems  Constitutional: Negative.   HENT: Negative.    Respiratory: Negative.    Cardiovascular: Negative.   Gastrointestinal: Negative.   Genitourinary: Negative.   Musculoskeletal:  Positive for arthralgias.  Skin:  Negative for rash.  All other systems reviewed and are negative.     Objective:    Physical Exam Vitals and nursing note reviewed.  Constitutional:      Appearance: Normal appearance.  HENT:     Head: Normocephalic.      Nose: Nose normal.  Eyes:     Conjunctiva/sclera: Conjunctivae normal.  Cardiovascular:     Pulses: Normal pulses.     Heart sounds: Normal heart sounds.  Pulmonary:     Effort: Pulmonary effort is normal.     Breath sounds: Normal breath sounds.  Abdominal:     General: Bowel sounds are normal.  Musculoskeletal:     Right knee: Decreased range of motion. Tenderness present.     Left knee: Decreased range of motion. Tenderness present.  Skin:    Findings: No rash.  Neurological:     Mental Status: She is alert and oriented to person, place, and time.    BP 134/83   Pulse 97   Temp 98 F (36.7 C) (Temporal)   Ht '5\' 3"'$  (1.6 m)   Wt 173 lb (78.5 kg)   SpO2 98%   BMI 30.65 kg/m  Wt Readings from Last 3 Encounters:  02/02/21 173 lb (78.5 kg)  01/19/21 173 lb (78.5 kg)  01/01/21 175 lb 2 oz (79.4 kg)    Health Maintenance Due  Topic Date Due   COVID-19 Vaccine (1) Never done   Zoster Vaccines- Shingrix (1 of 2) Never done    There are no preventive care reminders to display for this patient.   Lab Results  Component Value Date   TSH 3.220 02/29/2020   Lab Results  Component Value Date   WBC 7.5 09/05/2020   HGB 11.9 09/05/2020   HCT 36.5 09/05/2020   MCV 89 09/05/2020   PLT 272 09/05/2020   Lab Results  Component Value Date   NA 143 09/05/2020   K 4.4 09/05/2020   CO2 22 09/05/2020   GLUCOSE 112 (H) 09/05/2020   BUN 21 09/05/2020   CREATININE 1.39 (H) 09/05/2020   BILITOT 0.8 02/29/2020   ALKPHOS 104 02/29/2020   AST 14 02/29/2020   ALT 9 02/29/2020   PROT 7.3 02/29/2020   ALBUMIN 4.1 09/05/2020   CALCIUM 9.7 09/05/2020   Lab Results  Component Value Date   CHOL 176 09/05/2020   Lab Results  Component Value Date   HDL 41 09/05/2020   Lab Results  Component Value Date   LDLCALC 109 (H) 09/05/2020   Lab Results  Component Value Date   TRIG 148 09/05/2020   Lab Results  Component Value Date   CHOLHDL 4.3 09/05/2020   Lab Results   Component Value Date   HGBA1C 6.0 02/07/2017       Assessment & Plan:   Problem List Items Addressed This Visit  Other   Chronic pain of right knee - Primary    Symptoms not well controlled.  Encourage patient to elevate knee apply ice or warm compress as tolerated, lidocaine patch, continue Voltaren or capsaicin topical gel, completed referral to orthopedic.  Bilateral knee x-ray completed results pending.   Follow-up with worsening or unresolved symptoms.       Relevant Orders   DG Knee 1-2 Views Right   Ambulatory referral to Orthopedic Surgery   DG Knee 1-2 Views Left     No orders of the defined types were placed in this encounter.    Ivy Lynn, NP

## 2021-02-06 ENCOUNTER — Encounter: Payer: Self-pay | Admitting: Nurse Practitioner

## 2021-02-07 ENCOUNTER — Other Ambulatory Visit: Payer: Self-pay | Admitting: Family Medicine

## 2021-02-07 DIAGNOSIS — M1711 Unilateral primary osteoarthritis, right knee: Secondary | ICD-10-CM

## 2021-02-22 DIAGNOSIS — G8929 Other chronic pain: Secondary | ICD-10-CM | POA: Diagnosis not present

## 2021-02-22 DIAGNOSIS — N1832 Chronic kidney disease, stage 3b: Secondary | ICD-10-CM | POA: Diagnosis not present

## 2021-02-22 DIAGNOSIS — K219 Gastro-esophageal reflux disease without esophagitis: Secondary | ICD-10-CM | POA: Diagnosis not present

## 2021-02-22 DIAGNOSIS — I129 Hypertensive chronic kidney disease with stage 1 through stage 4 chronic kidney disease, or unspecified chronic kidney disease: Secondary | ICD-10-CM | POA: Diagnosis not present

## 2021-02-22 DIAGNOSIS — M25562 Pain in left knee: Secondary | ICD-10-CM | POA: Diagnosis not present

## 2021-02-22 DIAGNOSIS — Z87442 Personal history of urinary calculi: Secondary | ICD-10-CM | POA: Diagnosis not present

## 2021-02-27 DIAGNOSIS — M25562 Pain in left knee: Secondary | ICD-10-CM | POA: Diagnosis not present

## 2021-02-27 DIAGNOSIS — M1711 Unilateral primary osteoarthritis, right knee: Secondary | ICD-10-CM | POA: Diagnosis not present

## 2021-02-27 DIAGNOSIS — M25561 Pain in right knee: Secondary | ICD-10-CM | POA: Diagnosis not present

## 2021-02-27 DIAGNOSIS — M7051 Other bursitis of knee, right knee: Secondary | ICD-10-CM | POA: Diagnosis not present

## 2021-03-05 ENCOUNTER — Encounter: Payer: Medicare HMO | Admitting: Family Medicine

## 2021-03-06 ENCOUNTER — Ambulatory Visit (INDEPENDENT_AMBULATORY_CARE_PROVIDER_SITE_OTHER): Payer: Medicare HMO

## 2021-03-06 VITALS — Ht 63.0 in | Wt 173.0 lb

## 2021-03-06 DIAGNOSIS — Z0000 Encounter for general adult medical examination without abnormal findings: Secondary | ICD-10-CM

## 2021-03-06 NOTE — Progress Notes (Signed)
Subjective:   Wendy Barajas is a 80 y.o. female who presents for Medicare Annual (Subsequent) preventive examination.  Virtual Visit via Telephone Note  I connected with  [name] on 03/06/21 at  3:30 PM EDT by telephone and verified that I am speaking with the correct person using two identifiers.  Location: Patient: Home Provider: [facility] Persons participating in the virtual visit: patient/Nurse Health Advisor   I discussed the limitations, risks, security and privacy concerns of performing an evaluation and management service by telephone and the availability of in person appointments. The patient expressed understanding and agreed to proceed.  Interactive audio and video telecommunications were attempted between this nurse and patient, however failed, due to patient having technical difficulties OR patient did not have access to video capability.  We continued and completed visit with audio only.  Some vital signs may be absent or patient reported.   Tyliyah Mcmeekin E Rosha Cocker, LPN   Review of Systems     Cardiac Risk Factors include: advanced age (>52mn, >>42women);dyslipidemia;hypertension;obesity (BMI >30kg/m2);sedentary lifestyle     Objective:    Today's Vitals   03/06/21 1539  Weight: 173 lb (78.5 kg)  Height: '5\' 3"'$  (1.6 m)  PainSc: 5    Body mass index is 30.65 kg/m.  Advanced Directives 03/06/2021 12/09/2019 12/08/2018 10/30/2017  Does Patient Have a Medical Advance Directive? Yes No No No  Type of Advance Directive [place] Chart? No - copy requested - - -  Would patient like information on creating a medical advance directive? - No - Patient declined No - Patient declined Yes (ED - Information included in AVS)    Current Medications (verified) Outpatient Encounter Medications as of 03/06/2021  Medication Sig   amLODipine (NORVASC) 2.5 MG tablet Take 1 tablet (2.5 mg total) by mouth daily. Discontinue  '5mg'$  tablet   amLODipine (NORVASC) 5 MG tablet    calcium carbonate (OS-CAL - DOSED IN MG OF ELEMENTAL CALCIUM) 1250 (500 Ca) MG tablet Take 1 tablet by mouth.   Capsaicin-Menthol-Methyl Sal (CAPSAICIN-METHYL SAL-MENTHOL) 0.025-1-12 % CREA Apply 1 application topically 4 (four) times daily as needed.   Cholecalciferol (VITAMIN D3) 400 UNITS CAPS Take 1 capsule by mouth daily.    loratadine (CLARITIN) 10 MG tablet TAKE 1 TABLET EVERY DAY   losartan (COZAAR) 50 MG tablet Take 1 tablet (50 mg total) by mouth daily.   nystatin cream (MYCOSTATIN) Apply 1 application topically 2 (two) times daily. To the groin areas x7-14 days per flare.   RABEprazole (ACIPHEX) 20 MG tablet Take 1 tablet (20 mg total) by mouth daily.   simvastatin (ZOCOR) 20 MG tablet Take 1 tablet (20 mg total) by mouth daily.   No facility-administered encounter medications on file as of 03/06/2021.    Allergies (verified) Pollen extract   History: Past Medical History:  Diagnosis Date   Allergy    Arthritis    bone spurs - bilateral knee issues    GERD (gastroesophageal reflux disease)    Hyperlipidemia    Hypertension    Past Surgical History:  Procedure Laterality Date   ABDOMINAL HYSTERECTOMY     COLONOSCOPY     EYE SURGERY Bilateral    cataract removal    Family History  Problem Relation Age of Onset   Cancer Father        unsure = thinks colon    Diabetes Mother    Heart disease Mother  Cancer Brother        unsure    Stroke Brother    Arthritis Sister        back issues    Social History   Socioeconomic History   Marital status: Widowed    Spouse name: Not on file   Number of children: 1   Years of education: Not on file   Highest education level: 11th grade  Occupational History   Occupation: Retired   Tobacco Use   Smoking status: Never   Smokeless tobacco: Never  Vaping Use   Vaping Use: Never used  Substance and Sexual Activity   Alcohol use: No    Alcohol/week: 0.0 standard  drinks   Drug use: No   Sexual activity: Not Currently  Other Topics Concern   Not on file  Social History Narrative   Live alone; Her son lives nearby   Social Determinants of Health   Financial Resource Strain: Low Risk    Difficulty of Paying Living Expenses: Not hard at all  Food Insecurity: No Food Insecurity   Worried About Charity fundraiser in the Last Year: Never true   Arboriculturist in the Last Year: Never true  Transportation Needs: No Transportation Needs   Lack of Transportation (Medical): No   Lack of Transportation (Non-Medical): No  Physical Activity: Insufficiently Active   Days of Exercise per Week: 7 days   Minutes of Exercise per Session: 10 min  Stress: No Stress Concern Present   Feeling of Stress : Not at all  Social Connections: Moderately Integrated   Frequency of Communication with Friends and Family: More than three times a week   Frequency of Social Gatherings with Friends and Family: More than three times a week   Attends Religious Services: More than 4 times per year   Active Member of Clubs or Organizations: No   Attends Archivist Meetings: 1 to 4 times per year   Marital Status: Widowed    Tobacco Counseling Counseling given: Not Answered   Clinical Intake:  Pre-visit preparation completed: Yes  Pain : 0-10 Pain Score: 5  Pain Type: Chronic pain Pain Location: Knee Pain Orientation: Right, Left Pain Descriptors / Indicators: Aching, Sharp, Sore, Discomfort Pain Onset: More than a month ago Pain Frequency: Intermittent     BMI - recorded: 30.65 Nutritional Status: BMI > 30  Obese Nutritional Risks: None Diabetes: No  How often do you need to have someone help you when you read instructions, pamphlets, or other written materials from your doctor or pharmacy?: 1 - Never  Diabetic? No  Interpreter Needed?: No  Information entered by :: Kianah Harries, LPN   Activities of Daily Living In your present state of  health, do you have any difficulty performing the following activities: 03/06/2021  Hearing? N  Vision? N  Difficulty concentrating or making decisions? N  Walking or climbing stairs? Y  Comment hurts knees  Dressing or bathing? N  Doing errands, shopping? N  Preparing Food and eating ? N  Using the Toilet? N  In the past six months, have you accidently leaked urine? N  Do you have problems with loss of bowel control? N  Managing your Medications? N  Managing your Finances? N  Housekeeping or managing your Housekeeping? N  Some recent data might be hidden    Patient Care Team: [name], DO as PCP - General (Family Medicine)  Indicate any recent Medical Services you may have received  from other than [facility] providers in the past year (date may be approximate).     Assessment:   This is a routine wellness examination for Hanae.  Hearing/Vision screen Hearing Screening - Comments:: Denies hearing difficulties  Vision Screening - Comments:: Wears eyeglasses - behind on eye exams - hasn't seen an eye doctor in years.  Dietary issues and exercise activities discussed: Current Exercise Habits: Home exercise routine, Type of exercise: walking, Time (Minutes): 10, Frequency (Times/Week): 7, Weekly Exercise (Minutes/Week): 70, Intensity: Mild, Exercise limited by: orthopedic condition(s)   Goals Addressed             This Visit's Progress    Prevent falls   On track      Depression Screen PHQ 2/9 Scores 03/06/2021 02/02/2021 01/19/2021 01/01/2021 09/05/2020 07/25/2020 07/10/2020  PHQ - 2 Score 0 0 0 0 0 0 0  PHQ- 9 Score - - - 2 - 0 -    Fall Risk Fall Risk  03/06/2021 02/02/2021 01/19/2021 01/01/2021 09/05/2020  Falls in the past year? 0 0 0 1 0  Number falls in past yr: 0 - - 0 -  Injury with Fall? 0 - - 0 -  Risk for fall due to : Orthopedic patient;Impaired balance/gait;Impaired vision - - History of fall(s) -  Follow up Falls prevention discussed;Education provided -  - Falls evaluation completed -    FALL RISK PREVENTION PERTAINING TO THE HOME:  Any stairs in or around the home? No  If so, are there any without handrails? No  Home free of loose throw rugs in walkways, pet beds, electrical cords, etc? Yes  Adequate lighting in your home to reduce risk of falls? Yes   ASSISTIVE DEVICES UTILIZED TO PREVENT FALLS:  Life alert? No  Use of a cane, walker or w/c? Yes  Grab bars in the bathroom? Yes  Shower chair or bench in shower? No  Elevated toilet seat or a handicapped toilet? No   TIMED UP AND GO:  Was the test performed? No . Telephonic visit  Cognitive Function: Normal cognitive status assessed by direct observation by this Nurse Health Advisor. No abnormalities found.    MMSE - Mini Mental State Exam 10/30/2017  Orientation to time 5  Orientation to Place 5  Registration 3  Attention/ Calculation 5  Recall 3  Language- name 2 objects 2  Language- repeat 1  Language- follow 3 step command 3  Language- read & follow direction 1  Write a sentence 1  Copy design 1  Total score 30     6CIT Screen 12/09/2019 12/08/2018  What Year? 0 points 0 points  What month? 0 points 0 points  What time? 0 points 0 points  Count back from 20 0 points 0 points  Months in reverse 0 points 2 points  Repeat phrase 0 points 0 points  Total Score 0 2    Immunizations Immunization History  Administered Date(s) Administered   Influenza, High Dose Seasonal PF 06/26/2017   Influenza,inj,Quad PF,6+ Mos 06/20/2015   Tdap 02/12/2019    TDAP status: Up to date  Flu Vaccine status: Declined, Education has been provided regarding the importance of this vaccine but patient still declined. Advised may receive this vaccine at local pharmacy or Health Dept. Aware to provide a copy of the vaccination record if obtained from local pharmacy or Health Dept. Verbalized acceptance and understanding.  Pneumococcal vaccine status: Declined,  Education has been  provided regarding the importance of this vaccine but patient still  declined. Advised may receive this vaccine at local pharmacy or Health Dept. Aware to provide a copy of the vaccination record if obtained from local pharmacy or Health Dept. Verbalized acceptance and understanding.   Covid-19 vaccine status: Declined, Education has been provided regarding the importance of this vaccine but patient still declined. Advised may receive this vaccine at local pharmacy or Health Dept.or vaccine clinic. Aware to provide a copy of the vaccination record if obtained from local pharmacy or Health Dept. Verbalized acceptance and understanding.  Qualifies for Shingles Vaccine? Yes   Zostavax completed No   Shingrix Completed?: No.    Education has been provided regarding the importance of this vaccine. Patient has been advised to call insurance company to determine out of pocket expense if they have not yet received this vaccine. Advised may also receive vaccine at local pharmacy or Health Dept. Verbalized acceptance and understanding.  Screening Tests Health Maintenance  Topic Date Due   COVID-19 Vaccine (1) Never done   Zoster Vaccines- Shingrix (1 of 2) Never done   PNA vac Low Risk Adult (1 of 2 - PCV13) Never done   INFLUENZA VACCINE  03/12/2021   TETANUS/TDAP  02/11/2029   DEXA SCAN  Completed   HPV VACCINES  Aged Out    Health Maintenance  Health Maintenance Due  Topic Date Due   COVID-19 Vaccine (1) Never done   Zoster Vaccines- Shingrix (1 of 2) Never done   PNA vac Low Risk Adult (1 of 2 - PCV13) Never done    Colorectal cancer screening: No longer required.   Mammogram status: No longer required due to age.  Bone Density status: Completed 10/30/2017. Results reflect: Bone density results: OSTEOPENIA. Repeat every 2 years.  Lung Cancer Screening: (Low Dose CT Chest recommended if Age 35-80 years, 30 pack-year currently smoking OR have quit w/in 15years.) does not qualify.    Additional Screening:  Hepatitis C Screening: does not qualify  Vision Screening: Recommended annual ophthalmology exams for early detection of glaucoma and other disorders of the eye. Is the patient up to date with their annual eye exam?  No  Who is the provider or what is the name of the office in which the patient attends annual eye exams? none If pt is not established with a provider, would they like to be referred to a provider to establish care? No .   Dental Screening: Recommended annual dental exams for proper oral hygiene  Community Resource Referral / Chronic Care Management: CRR required this visit?  No   CCM required this visit?  No      Plan:     I have personally reviewed and noted the following in the patient's chart:   Medical and social history Use of alcohol, tobacco or illicit drugs  Current medications and supplements including opioid prescriptions.  Functional ability and status Nutritional status Physical activity Advanced directives List of other physicians Hospitalizations, surgeries, and ER visits in previous 12 months Vitals Screenings to include cognitive, depression, and falls Referrals and appointments  In addition, I have reviewed and discussed with patient certain preventive protocols, quality metrics, and best practice recommendations. A written personalized care plan for preventive services as well as general preventive health recommendations were provided to patient.     Sandrea Hammond, LPN   [id number]   Nurse Notes: None

## 2021-03-06 NOTE — Patient Instructions (Signed)
[name] , Thank you for taking time to come for your Medicare Wellness Visit. I appreciate your ongoing commitment to your health goals. Please review the following plan we discussed and let me know if I can assist you in the future.   Screening recommendations/referrals: Colonoscopy: Done 02/27/2017 - no repeat required Mammogram: Done 09/12/2013 - declines repeat - still recommended every year Bone Density: Done 10/30/2017 - Repeat every 2 years  Recommended yearly ophthalmology/optometry visit for glaucoma screening and checkup Recommended yearly dental visit for hygiene and checkup  Vaccinations: Influenza vaccine: Declined (recommended every year) Pneumococcal vaccine: Declined (2 doses one year apart) Tdap vaccine: Done 02/12/2019 Shingles vaccine: Due. (2 doses 2-6 months apart)   Covid-19: Declined  Advanced directives: Please bring a copy of your health care power of attorney and living will to the office to be added to your chart at your convenience.   Conditions/risks identified: Continue to try to exercise for 30 minutes each day, seated leg raises, light weight arm lifts, etc.  Next appointment: Follow up in one year for your annual wellness visit    Preventive Care 65 Years and Older, Female Preventive care refers to lifestyle choices and visits with your health care provider that can promote health and wellness. What does preventive care include? A yearly physical exam. This is also called an annual well check. Dental exams once or twice a year. Routine eye exams. Ask your health care provider how often you should have your eyes checked. Personal lifestyle choices, including: Daily care of your teeth and gums. Regular physical activity. Eating a healthy diet. Avoiding tobacco and drug use. Limiting alcohol use. Practicing safe sex. Taking low-dose aspirin every day. Taking vitamin and mineral supplements as recommended by your health care provider. What happens  during an annual well check? The services and screenings done by your health care provider during your annual well check will depend on your age, overall health, lifestyle risk factors, and family history of disease. Counseling  Your health care provider may ask you questions about your: Alcohol use. Tobacco use. Drug use. Emotional well-being. Home and relationship well-being. Sexual activity. Eating habits. History of falls. Memory and ability to understand (cognition). Work and work Statistician. Reproductive health. Screening  You may have the following tests or measurements: Height, weight, and BMI. Blood pressure. Lipid and cholesterol levels. These may be checked every 5 years, or more frequently if you are over 78 years old. Skin check. Lung cancer screening. You may have this screening every year starting at age 42 if you have a 30-pack-year history of smoking and currently smoke or have quit within the past 15 years. Fecal occult blood test (FOBT) of the stool. You may have this test every year starting at age 35. Flexible sigmoidoscopy or colonoscopy. You may have a sigmoidoscopy every 5 years or a colonoscopy every 10 years starting at age 25. Hepatitis C blood test. Hepatitis B blood test. Sexually transmitted disease (STD) testing. Diabetes screening. This is done by checking your blood sugar (glucose) after you have not eaten for a while (fasting). You may have this done every 1-3 years. Bone density scan. This is done to screen for osteoporosis. You may have this done starting at age 49. Mammogram. This may be done every 1-2 years. Talk to your health care provider about how often you should have regular mammograms. Talk with your health care provider about your test results, treatment options, and if necessary, the need for more tests. Vaccines  Your health care provider may recommend certain vaccines, such as: Influenza vaccine. This is recommended every  year. Tetanus, diphtheria, and acellular pertussis (Tdap, Td) vaccine. You may need a Td booster every 10 years. Zoster vaccine. You may need this after age 56. Pneumococcal 13-valent conjugate (PCV13) vaccine. One dose is recommended after age 83. Pneumococcal polysaccharide (PPSV23) vaccine. One dose is recommended after age 62. Talk to your health care provider about which screenings and vaccines you need and how often you need them. This information is not intended to replace advice given to you by your health care provider. Make sure you discuss any questions you have with your health care provider. Document Released: 08/25/2015 Document Revised: 04/17/2016 Document Reviewed: 05/30/2015 Elsevier Interactive Patient Education  2017 [place] Prevention in the Home Falls can cause injuries. They can happen to people of all ages. There are many things you can do to make your home safe and to help prevent falls. What can I do on the outside of my home? Regularly fix the edges of walkways and driveways and fix any cracks. Remove anything that might make you trip as you walk through a door, such as a raised step or threshold. Trim any bushes or trees on the path to your home. Use bright outdoor lighting. Clear any walking paths of anything that might make someone trip, such as rocks or tools. Regularly check to see if handrails are loose or broken. Make sure that both sides of any steps have handrails. Any raised decks and porches should have guardrails on the edges. Have any leaves, snow, or ice cleared regularly. Use sand or salt on walking paths during winter. Clean up any spills in your garage right away. This includes oil or grease spills. What can I do in the bathroom? Use night lights. Install grab bars by the toilet and in the tub and shower. Do not use towel bars as grab bars. Use non-skid mats or decals in the tub or shower. If you need to sit down in the shower, use a  plastic, non-slip stool. Keep the floor dry. Clean up any water that spills on the floor as soon as it happens. Remove soap buildup in the tub or shower regularly. Attach bath mats securely with double-sided non-slip rug tape. Do not have throw rugs and other things on the floor that can make you trip. What can I do in the bedroom? Use night lights. Make sure that you have a light by your bed that is easy to reach. Do not use any sheets or blankets that are too big for your bed. They should not hang down onto the floor. Have a firm chair that has side arms. You can use this for support while you get dressed. Do not have throw rugs and other things on the floor that can make you trip. What can I do in the kitchen? Clean up any spills right away. Avoid walking on wet floors. Keep items that you use a lot in easy-to-reach places. If you need to reach something above you, use a strong step stool that has a grab bar. Keep electrical cords out of the way. Do not use floor polish or wax that makes floors slippery. If you must use wax, use non-skid floor wax. Do not have throw rugs and other things on the floor that can make you trip. What can I do with my stairs? Do not leave any items on the stairs. Make sure that there are  handrails on both sides of the stairs and use them. Fix handrails that are broken or loose. Make sure that handrails are as long as the stairways. Check any carpeting to make sure that it is firmly attached to the stairs. Fix any carpet that is loose or worn. Avoid having throw rugs at the top or bottom of the stairs. If you do have throw rugs, attach them to the floor with carpet tape. Make sure that you have a light switch at the top of the stairs and the bottom of the stairs. If you do not have them, ask someone to add them for you. What else can I do to help prevent falls? Wear shoes that: Do not have high heels. Have rubber bottoms. Are comfortable and fit you  well. Are closed at the toe. Do not wear sandals. If you use a stepladder: Make sure that it is fully opened. Do not climb a closed stepladder. Make sure that both sides of the stepladder are locked into place. Ask someone to hold it for you, if possible. Clearly mark and make sure that you can see: Any grab bars or handrails. First and last steps. Where the edge of each step is. Use tools that help you move around (mobility aids) if they are needed. These include: Canes. Walkers. Scooters. Crutches. Turn on the lights when you go into a dark area. Replace any light bulbs as soon as they burn out. Set up your furniture so you have a clear path. Avoid moving your furniture around. If any of your floors are uneven, fix them. If there are any pets around you, be aware of where they are. Review your medicines with your doctor. Some medicines can make you feel dizzy. This can increase your chance of falling. Ask your doctor what other things that you can do to help prevent falls. This information is not intended to replace advice given to you by your health care provider. Make sure you discuss any questions you have with your health care provider. Document Released: 05/25/2009 Document Revised: 01/04/2016 Document Reviewed: 09/02/2014 Elsevier Interactive Patient Education  2017 [employer].

## 2021-03-08 ENCOUNTER — Encounter: Payer: Self-pay | Admitting: Family Medicine

## 2021-04-06 DIAGNOSIS — M1711 Unilateral primary osteoarthritis, right knee: Secondary | ICD-10-CM | POA: Diagnosis not present

## 2021-04-13 DIAGNOSIS — M1711 Unilateral primary osteoarthritis, right knee: Secondary | ICD-10-CM | POA: Diagnosis not present

## 2021-04-20 DIAGNOSIS — M1711 Unilateral primary osteoarthritis, right knee: Secondary | ICD-10-CM | POA: Diagnosis not present

## 2021-04-26 ENCOUNTER — Encounter: Payer: Self-pay | Admitting: Family Medicine

## 2021-04-26 ENCOUNTER — Other Ambulatory Visit: Payer: Self-pay

## 2021-04-26 ENCOUNTER — Ambulatory Visit (INDEPENDENT_AMBULATORY_CARE_PROVIDER_SITE_OTHER): Payer: Medicare HMO | Admitting: Family Medicine

## 2021-04-26 VITALS — BP 151/83 | HR 92 | Temp 97.4°F | Ht 63.0 in | Wt 177.2 lb

## 2021-04-26 DIAGNOSIS — I10 Essential (primary) hypertension: Secondary | ICD-10-CM

## 2021-04-26 DIAGNOSIS — Z0001 Encounter for general adult medical examination with abnormal findings: Secondary | ICD-10-CM

## 2021-04-26 DIAGNOSIS — N1832 Chronic kidney disease, stage 3b: Secondary | ICD-10-CM

## 2021-04-26 DIAGNOSIS — Z0000 Encounter for general adult medical examination without abnormal findings: Secondary | ICD-10-CM

## 2021-04-26 NOTE — Progress Notes (Signed)
Wendy Barajas is a 80 y.o. female presents to office today for annual physical exam examination.    Concerns today include: 1.  Bilateral knee pain Patient with a care of orthopedics for this issue.  She had viscosupplementation x3 with last injection on Friday in the right knee.  She is having some issues with the left and is going to talk to him about perhaps doing that knee as well.  To be seen as hell if these viscosupplementation is going to be helpful though  Sometimes she feels like her balance is off because of her knees and lack of exercise.  Occupation: retired, Substance use: none Diet: balanced, Exercise: none Last eye exam: UTD Last dental exam: UTD Last colonoscopy: UTD Last mammogram: UTD Last pap smear: UTD Refills needed today: none Immunizations needed: Immunization History  Administered Date(s) Administered   Influenza, High Dose Seasonal PF 06/26/2017   Influenza,inj,Quad PF,6+ Mos 06/20/2015   Tdap 02/12/2019     Past Medical History:  Diagnosis Date   Allergy    Arthritis    bone spurs - bilateral knee issues    GERD (gastroesophageal reflux disease)    Hyperlipidemia    Hypertension    Social History   Socioeconomic History   Marital status: Widowed    Spouse name: Not on file   Number of children: 1   Years of education: Not on file   Highest education level: 11th grade  Occupational History   Occupation: Retired   Tobacco Use   Smoking status: Never   Smokeless tobacco: Never  Vaping Use   Vaping Use: Never used  Substance and Sexual Activity   Alcohol use: No    Alcohol/week: 0.0 standard drinks   Drug use: No   Sexual activity: Not Currently  Other Topics Concern   Not on file  Social History Narrative   Live alone; Her son lives nearby   Social Determinants of Health   Financial Resource Strain: Low Risk    Difficulty of Paying Living Expenses: Not hard at all  Food Insecurity: No Food Insecurity   Worried About Ship broker in the Last Year: Never true   Arboriculturist in the Last Year: Never true  Transportation Needs: No Transportation Needs   Lack of Transportation (Medical): No   Lack of Transportation (Non-Medical): No  Physical Activity: Insufficiently Active   Days of Exercise per Week: 7 days   Minutes of Exercise per Session: 10 min  Stress: No Stress Concern Present   Feeling of Stress : Not at all  Social Connections: Moderately Integrated   Frequency of Communication with Friends and Family: More than three times a week   Frequency of Social Gatherings with Friends and Family: More than three times a week   Attends Religious Services: More than 4 times per year   Active Member of Clubs or Organizations: No   Attends Archivist Meetings: 1 to 4 times per year   Marital Status: Widowed  Human resources officer Violence: Not At Risk   Fear of Current or Ex-Partner: No   Emotionally Abused: No   Physically Abused: No   Sexually Abused: No   Past Surgical History:  Procedure Laterality Date   ABDOMINAL HYSTERECTOMY     COLONOSCOPY     EYE SURGERY Bilateral    cataract removal    Family History  Problem Relation Age of Onset   Cancer Father        unsure =  thinks colon    Diabetes Mother    Heart disease Mother    Cancer Brother        unsure    Stroke Brother    Arthritis Sister        back issues     Current Outpatient Medications:    amLODipine (NORVASC) 2.5 MG tablet, Take 1 tablet (2.5 mg total) by mouth daily. Discontinue '5mg'$  tablet, Disp: 90 tablet, Rfl: 3   calcium carbonate (OS-CAL - DOSED IN MG OF ELEMENTAL CALCIUM) 1250 (500 Ca) MG tablet, Take 1 tablet by mouth., Disp: , Rfl:    Capsaicin-Menthol-Methyl Sal (CAPSAICIN-METHYL SAL-MENTHOL) 0.025-1-12 % CREA, Apply 1 application topically 4 (four) times daily as needed., Disp: 56.6 g, Rfl: 3   Cholecalciferol (VITAMIN D3) 400 UNITS CAPS, Take 1 capsule by mouth daily. , Disp: , Rfl:    loratadine (CLARITIN) 10  MG tablet, TAKE 1 TABLET EVERY DAY, Disp: 90 tablet, Rfl: 3   losartan (COZAAR) 50 MG tablet, Take 1 tablet (50 mg total) by mouth daily., Disp: 90 tablet, Rfl: 3   nystatin cream (MYCOSTATIN), Apply 1 application topically 2 (two) times daily. To the groin areas x7-14 days per flare., Disp: 30 g, Rfl: 0   RABEprazole (ACIPHEX) 20 MG tablet, Take 1 tablet (20 mg total) by mouth daily., Disp: 90 tablet, Rfl: 3   simvastatin (ZOCOR) 20 MG tablet, Take 1 tablet (20 mg total) by mouth daily., Disp: 90 tablet, Rfl: 3  Allergies  Allergen Reactions   Pollen Extract      ROS: Review of Systems Pertinent items noted in HPI and remainder of comprehensive ROS otherwise negative.    Physical exam BP (!) 151/83   Pulse 92   Temp (!) 97.4 F (36.3 C) (Temporal)   Ht '5\' 3"'$  (1.6 m)   Wt 177 lb 3.2 oz (80.4 kg)   SpO2 99%   BMI 31.39 kg/m  General appearance: alert, cooperative, appears stated age, and no distress Head: Normocephalic, without obvious abnormality, atraumatic Eyes: negative findings: lids and lashes normal, conjunctivae and sclerae normal, corneas clear, and pupils equal, round, reactive to light and accomodation Ears: normal TM's and external ear canals both ears Nose: Nares normal. Septum midline. Mucosa normal. No drainage or sinus tenderness. Throat:  Oropharynx clear and without masses.  Has false teeth uppers Neck: no adenopathy, supple, symmetrical, trachea midline, and thyroid not enlarged, symmetric, no tenderness/mass/nodules Back: symmetric, no curvature. ROM normal. No CVA tenderness. Lungs: clear to auscultation bilaterally Heart: regular rate and rhythm, S1, S2 normal, no murmur, click, rub or gallop Abdomen: soft, non-tender; bowel sounds normal; no masses,  no organomegaly Extremities: extremities normal, atraumatic, no cyanosis or edema Pulses: 2+ and symmetric Skin:  Areas of hyperpigmentation appreciated Lymph nodes: Cervical, supraclavicular, and axillary  nodes normal. Neurologic: Grossly normal MSK: Gait minimally antalgic.  She has arthritic changes to bilateral knees  MMSE - Mini Mental State Exam 04/26/2021 10/30/2017  Orientation to time 5 5  Orientation to Place 5 5  Registration 3 3  Attention/ Calculation 5 5  Recall 2 3  Language- name 2 objects 2 2  Language- repeat 1 1  Language- follow 3 step command 3 3  Language- read & follow direction 1 1  Write a sentence 1 1  Copy design 1 1  Total score 29 30    Assessment/ Plan: Wendy Barajas here for annual physical exam.   Annual physical exam  Chronic kidney disease, stage 3b ([place]) - Plan:  Renal Function Panel  Essential hypertension  Check renal function.  She is up-to-date on preventive health care. Blood pressure well controlled upon recheck.  No changes needed  Counseled on healthy lifestyle choices, including diet (rich in fruits, vegetables and lean meats and low in salt and simple carbohydrates) and exercise (at least 30 minutes of moderate physical activity daily).  Patient to follow up in 1 year for annual exam or sooner if needed.  Wendy Barajas M. Lajuana Ripple, DO

## 2021-04-26 NOTE — Patient Instructions (Signed)
Preventive Care 81 Years and Older, Female Preventive care refers to lifestyle choices and visits with your health care provider that can promote health and wellness. This includes: A yearly physical exam. This is also called an annual wellness visit. Regular dental and eye exams. Immunizations. Screening for certain conditions. Healthy lifestyle choices, such as: Eating a healthy diet. Getting regular exercise. Not using drugs or products that contain nicotine and tobacco. Limiting alcohol use. What can I expect for my preventive care visit? Physical exam Your health care provider will check your: Height and weight. These may be used to calculate your BMI (body mass index). BMI is a measurement that tells if you are at a healthy weight. Heart rate and blood pressure. Body temperature. Skin for abnormal spots. Counseling Your health care provider may ask you questions about your: Past medical problems. Family's medical history. Alcohol, tobacco, and drug use. Emotional well-being. Home life and relationship well-being. Sexual activity. Diet, exercise, and sleep habits. History of falls. Memory and ability to understand (cognition). Work and work Statistician. Pregnancy and menstrual history. Access to firearms. What immunizations do I need? Vaccines are usually given at various ages, according to a schedule. Your health care provider will recommend vaccines for you based on your age, medical history, and lifestyle or other factors, such as travel or where you work. What tests do I need? Blood tests Lipid and cholesterol levels. These may be checked every 5 years, or more often depending on your overall health. Hepatitis C test. Hepatitis B test. Screening Lung cancer screening. You may have this screening every year starting at age 81 if you have a 30-pack-year history of smoking and currently smoke or have quit within the past 15 years. Colorectal cancer screening. All  adults should have this screening starting at age 81 and continuing until age 81. Your health care provider may recommend screening at age 81 if you are at increased risk. You will have tests every 1-10 years, depending on your results and the type of screening test. Diabetes screening. This is done by checking your blood sugar (glucose) after you have not eaten for a while (fasting). You may have this done every 1-3 years. Mammogram. This may be done every 1-2 years. Talk with your health care provider about how often you should have regular mammograms. Abdominal aortic aneurysm (AAA) screening. You may need this if you are a current or former smoker. BRCA-related cancer screening. This may be done if you have a family history of breast, ovarian, tubal, or peritoneal cancers. Other tests STD (sexually transmitted disease) testing, if you are at risk. Bone density scan. This is done to screen for osteoporosis. You may have this done starting at age 81. Talk with your health care provider about your test results, treatment options, and if necessary, the need for more tests. Follow these instructions at home: Eating and drinking  Eat a diet that includes fresh fruits and vegetables, whole grains, lean protein, and low-fat dairy products. Limit your intake of foods with high amounts of sugar, saturated fats, and salt. Take vitamin and mineral supplements as recommended by your health care provider. Do not drink alcohol if your health care provider tells you not to drink. If you drink alcohol: Limit how much you have to 0-1 drink a day. Be aware of how much alcohol is in your drink. In the [place] one drink equals one 12 oz bottle of beer (355 mL), one 5 oz glass of wine (148 mL), or one  1 oz glass of hard liquor (44 mL). Lifestyle Take daily care of your teeth and gums. Brush your teeth every morning and night with fluoride toothpaste. Floss one time each day. Stay active. Exercise for at least  30 minutes 5 or more days each week. Do not use any products that contain nicotine or tobacco, such as cigarettes, e-cigarettes, and chewing tobacco. If you need help quitting, ask your health care provider. Do not use drugs. If you are sexually active, practice safe sex. Use a condom or other form of protection in order to prevent STIs (sexually transmitted infections). Talk with your health care provider about taking a low-dose aspirin or statin. Find healthy ways to cope with stress, such as: Meditation, yoga, or listening to music. Journaling. Talking to a trusted person. Spending time with friends and family. Safety Always wear your seat belt while driving or riding in a vehicle. Do not drive: If you have been drinking alcohol. Do not ride with someone who has been drinking. When you are tired or distracted. While texting. Wear a helmet and other protective equipment during sports activities. If you have firearms in your house, make sure you follow all gun safety procedures. What's next? Visit your health care provider once a year for an annual wellness visit. Ask your health care provider how often you should have your eyes and teeth checked. Stay up to date on all vaccines. This information is not intended to replace advice given to you by your health care provider. Make sure you discuss any questions you have with your health care provider. Document Revised: 10/06/2020 Document Reviewed: 07/23/2018 Elsevier Patient Education  2022 [employer].

## 2021-04-27 LAB — RENAL FUNCTION PANEL
Albumin: 4.6 g/dL (ref 3.7–4.7)
BUN/Creatinine Ratio: 15 (ref 12–28)
BUN: 18 mg/dL (ref 8–27)
CO2: 23 mmol/L (ref 20–29)
Calcium: 10.1 mg/dL (ref 8.7–10.3)
Chloride: 102 mmol/L (ref 96–106)
Creatinine, Ser: 1.23 mg/dL — ABNORMAL HIGH (ref 0.57–1.00)
Glucose: 102 mg/dL — ABNORMAL HIGH (ref 65–99)
Phosphorus: 3.7 mg/dL (ref 3.0–4.3)
Potassium: 4.6 mmol/L (ref 3.5–5.2)
Sodium: 139 mmol/L (ref 134–144)
eGFR: 44 mL/min/{1.73_m2} — ABNORMAL LOW (ref >59)

## 2021-06-01 DIAGNOSIS — M1711 Unilateral primary osteoarthritis, right knee: Secondary | ICD-10-CM | POA: Diagnosis not present

## 2021-06-01 DIAGNOSIS — M7631 Iliotibial band syndrome, right leg: Secondary | ICD-10-CM | POA: Diagnosis not present

## 2021-06-01 DIAGNOSIS — M7051 Other bursitis of knee, right knee: Secondary | ICD-10-CM | POA: Diagnosis not present

## 2021-06-18 ENCOUNTER — Other Ambulatory Visit: Payer: Self-pay

## 2021-06-18 ENCOUNTER — Ambulatory Visit: Payer: Medicare HMO | Attending: Student

## 2021-06-18 DIAGNOSIS — M79604 Pain in right leg: Secondary | ICD-10-CM | POA: Insufficient documentation

## 2021-06-18 DIAGNOSIS — M6281 Muscle weakness (generalized): Secondary | ICD-10-CM | POA: Diagnosis not present

## 2021-06-18 NOTE — Therapy (Signed)
[facility] [place], [place], [address] Phone: [phone]   Fax:  [phone]  Physical Therapy Evaluation  Patient Details  Name: Wendy Barajas MRN: [id number] Date of Birth: Jun 07, 1941 Referring Provider (PT): Amie Critchley   Encounter Date: 06/18/2021   PT End of Session - 06/18/21 1034     Visit Number 1    Number of Visits 8    Date for PT Re-Evaluation 08/17/21    PT Start Time 1036    PT Stop Time 1114    PT Time Calculation (min) 38 min    Activity Tolerance Patient tolerated treatment well    Behavior During Therapy [facility] for tasks assessed/performed             Past Medical History:  Diagnosis Date   Allergy    Arthritis    bone spurs - bilateral knee issues    GERD (gastroesophageal reflux disease)    Hyperlipidemia    Hypertension     Past Surgical History:  Procedure Laterality Date   ABDOMINAL HYSTERECTOMY     COLONOSCOPY     EYE SURGERY Bilateral    cataract removal     There were no vitals filed for this visit.    Subjective Assessment - 06/18/21 1027     Subjective Patient reports that her right leg began hurting years ago, but she is unable to recall. She is unable to recall any mechanism of injury. She notes that she has had injections previously, but this did not help. She notes that her pain will very from day to day and be located in different areas along her right leg. She notes that she had something like this happen to her left leg previously, but it does not bother her now.    Pertinent History OA, HTN, and chronic kidney disease    Limitations Walking;Other (comment)   Driving   How long can you sit comfortably? less than 5 minutes    How long can you stand comfortably? unable to stand comfortably    How long can you walk comfortably? unable to stand comfortably    Patient Stated Goals housework, walk to mailbox without pain (unknown distance)    Currently in Pain? Yes    Pain  Score 10-Worst pain ever    Pain Location Leg    Pain Orientation Lateral;Right    Pain Descriptors / Indicators Tender;Sore    Pain Type Chronic pain    Pain Onset More than a month ago    Pain Frequency Constant    Aggravating Factors  standing, walking,    Pain Relieving Factors none    Effect of Pain on Daily Activities slowed her down and limits her ability to complete daily activities                [facility] PT Assessment - 06/18/21 0001       Assessment   Medical Diagnosis Pes anserinus bursitis of right knee    Referring Provider (PT) [place]    Onset Date/Surgical Date --   Unable to recall   Next MD Visit --   Mid December (unable to recall date)   Prior Therapy Yes, "years ago"      Precautions   Precautions None      Restrictions   Weight Bearing Restrictions No      Balance Screen   Has the patient fallen in the past 6 months No    Has the patient had a decrease in activity  level because of a fear of falling?  Yes    Is the patient reluctant to leave their home because of a fear of falling?  No      Home Environment   Living Environment Private residence    Living Arrangements Alone    Type of [place] to enter    Entrance Stairs-Number of Steps 1    [place] One level    [place] None      Prior Function   Level of [place] Retired    Leisure Walking      Cognition   Overall Cognitive Status Within Functional Limits for tasks assessed    Attention Focused    Memory Appears intact    Awareness Appears intact    Problem Solving Appears intact      Sensation   Light Touch Appears Intact    Additional Comments Patient reports right lateral leg numbness      ROM / Strength   AROM / PROM / Strength Strength;AROM      AROM   AROM Assessment Site Knee    Right/Left Knee Left;Right    Right Knee Extension 117   knee pain   Right Knee Flexion 3    Left Knee Extension 3    Left  Knee Flexion 125      Strength   Strength Assessment Site Hip;Knee;Ankle    Right/Left Hip Right;Left    Right Hip Flexion 4-/5    Left Hip Flexion 4/5    Right/Left Knee Right;Left    Right Knee Flexion 4/5    Right Knee Extension 4/5    Left Knee Flexion 4+/5    Left Knee Extension 4/5    Right/Left Ankle Right;Left    Right Ankle Dorsiflexion 4-/5    Left Ankle Dorsiflexion 4-/5      Palpation   Palpation comment TTP: right IT band, hamstrings, hip adductors                        Objective measurements completed on examination: See above findings.       [facility] Adult PT Treatment/Exercise - 06/18/21 0001       Exercises   Exercises Knee/Hip      Knee/Hip Exercises: Supine   Other Supine Knee/Hip Exercises Hip Abduction isometric   20 reps; 5 second hold     Manual Therapy   Manual Therapy Soft tissue mobilization    Soft tissue mobilization R IT band                       PT Short Term Goals - 06/18/21 1242       PT SHORT TERM GOAL #1   Title Patient will report no greater than 8/10 right leg pain with functional activities.    Baseline 10/10    Time 2    Period Weeks    Status New    Target Date 07/02/21               PT Long Term Goals - 06/18/21 1240       PT LONG TERM GOAL #1   Title Patient will be independent with her HEP.    Time 4    Period Weeks    Status New    Target Date 07/16/21      PT LONG TERM GOAL #2   Title Patient  will report no greater than 6/10 right leg pain with daily activities.    Baseline 10/10    Time 4    Period Weeks    Status New    Target Date 07/16/21      PT LONG TERM GOAL #3   Title Patient will be able to walk at least 15 minutes without being limited by her right leg pain.    Baseline limited by increased pain    Time 4    Period Weeks    Status New    Target Date 07/16/21                    Plan - 06/18/21 1227     Clinical Impression Statement Patient  is a 80 year old female presenting to physical therapy with right lateral knee pain with unknonw mechanism of injury. She was unable to recall the chronicity of her right leg pain, but notes it has been "years." Recommend that she continue with her recommended plan of care to maximize her functional mobility.    Personal Factors and Comorbidities Comorbidity 1;Comorbidity 3+;Comorbidity 2;Time since onset of injury/illness/exacerbation    Comorbidities OA, HTN, and chronic kidney disease    Examination-Activity Limitations Locomotion Level;Transfers;Sit;Sleep;Stairs;Stand    Examination-Participation Restrictions Cleaning;Community Activity    Stability/Clinical Decision Making Evolving/Moderate complexity    Clinical Decision Making Moderate    Rehab Potential Fair    PT Frequency 2x / week    PT Duration 4 weeks    PT Treatment/Interventions ADLs/Self Care Home Management;Cryotherapy;Electrical Stimulation;Ultrasound;Moist Heat;Iontophoresis 4mg /ml Dexamethasone;Gait training;Stair training;Functional mobility training;Therapeutic activities;Therapeutic exercise;Balance training;Neuromuscular re-education;Manual techniques;Patient/family education;Energy conservation;Vasopneumatic Device    PT Next Visit Plan STM to IT band, hip isometrics    PT Home Exercise Plan Hip abduction isometrics (3x10, 5 second hold, 2x per day)    Consulted and Agree with Plan of Care Patient             Patient will benefit from skilled therapeutic intervention in order to improve the following deficits and impairments:  Difficulty walking, Decreased range of motion, Decreased activity tolerance, Pain, Decreased balance, Impaired sensation, Decreased strength  Visit Diagnosis: Pain in right leg  Muscle weakness (generalized)     Problem List Patient Active Problem List   Diagnosis Date Noted   Weakness 04/09/2020   Pure hypercholesterolemia 02/29/2020   Chronic kidney disease, stage 3b ([place])  02/29/2020   Chronic pain of right knee 01/19/2019   DOE (dyspnea on exertion) 12/24/2018   Chronic pain of left knee 08/17/2018   Encounter for screening colonoscopy 02/27/2017   Chronic idiopathic constipation 02/07/2017   Well adult exam 08/11/2016   Acute non-recurrent maxillary sinusitis 08/11/2016   Gastroesophageal reflux disease without esophagitis 08/11/2016   Hyperlipidemia 02/14/2015   Essential hypertension 02/14/2015   Hypertension 03/30/2012    Darlin Coco, PT 06/18/2021, 12:45 PM  [facility] [address] [place], [place], [address] Phone: [phone]   Fax:  [phone]  Name: Wendy Barajas MRN: [id number] Date of Birth: 11-Apr-1941

## 2021-06-18 NOTE — Addendum Note (Signed)
Addended by: Darlin Coco on: 06/18/2021 02:13 PM   Modules accepted: Orders

## 2021-06-22 ENCOUNTER — Other Ambulatory Visit: Payer: Self-pay

## 2021-06-22 ENCOUNTER — Ambulatory Visit: Payer: Medicare HMO | Admitting: Physical Therapy

## 2021-06-22 DIAGNOSIS — M6281 Muscle weakness (generalized): Secondary | ICD-10-CM

## 2021-06-22 DIAGNOSIS — M79604 Pain in right leg: Secondary | ICD-10-CM | POA: Diagnosis not present

## 2021-06-22 NOTE — Therapy (Signed)
[facility] [place], [place], [address] Phone: [phone]   Fax:  [phone]  Physical Therapy Treatment  Patient Details  Name: Wendy Barajas MRN: [id number] Date of Birth: 1940/09/17 Referring Provider (PT): Amie Critchley   Encounter Date: 06/22/2021   PT End of Session - 06/22/21 1143     Visit Number 2    Number of Visits 8    Date for PT Re-Evaluation 08/17/21    PT Start Time 1030    PT Stop Time 1119    PT Time Calculation (min) 49 min    Activity Tolerance Patient tolerated treatment well    Behavior During Therapy [facility] for tasks assessed/performed             Past Medical History:  Diagnosis Date   Allergy    Arthritis    bone spurs - bilateral knee issues    GERD (gastroesophageal reflux disease)    Hyperlipidemia    Hypertension     Past Surgical History:  Procedure Laterality Date   ABDOMINAL HYSTERECTOMY     COLONOSCOPY     EYE SURGERY Bilateral    cataract removal     There were no vitals filed for this visit.   Subjective Assessment - 06/22/21 1147     Subjective COVID-19 screen performed prior to patient entering clinic.    Pertinent History OA, HTN, and chronic kidney disease    Limitations Walking;Other (comment)    How long can you sit comfortably? less than 5 minutes    How long can you stand comfortably? unable to stand comfortably    How long can you walk comfortably? unable to stand comfortably    Patient Stated Goals housework, walk to mailbox without pain (unknown distance)    Currently in Pain? Yes    Pain Score 8     Pain Location Knee    Pain Orientation Right;Lateral    Pain Descriptors / Indicators Throbbing;Tender;Sore    Pain Type Chronic pain    Pain Onset More than a month ago    Pain Frequency Constant                               [facility] Adult PT Treatment/Exercise - 06/22/21 0001       Modalities   Modalities Electrical  Stimulation;Moist Heat;Iontophoresis;Ultrasound      Moist Heat Therapy   Number Minutes Moist Heat 20 Minutes    Moist Heat Location --   Right knee.     Acupuncturist Location Right ditsal ITB.    Electrical Stimulation Action Pre-mod.    Electrical Stimulation Parameters 80-150 Hz. x 20 minutes.    Electrical Stimulation Goals Pain      Ultrasound   Ultrasound Location Right distal ITB    Ultrasound Parameters Combo e'stim/US at 1.50 W/CM2 x 12 minutes.    Ultrasound Goals Pain      Iontophoresis   Type of Iontophoresis Dexamethasone    Location Right distal ITB.    Dose 80 mA-min.    Time 8                       PT Short Term Goals - 06/18/21 1242       PT SHORT TERM GOAL #1   Title Patient will report no greater than 8/10 right leg pain with functional activities.    Baseline 10/10  Time 2    Period Weeks    Status New    Target Date 07/02/21               PT Long Term Goals - 06/18/21 1240       PT LONG TERM GOAL #1   Title Patient will be independent with her HEP.    Time 4    Period Weeks    Status New    Target Date 07/16/21      PT LONG TERM GOAL #2   Title Patient will report no greater than 6/10 right leg pain with daily activities.    Baseline 10/10    Time 4    Period Weeks    Status New    Target Date 07/16/21      PT LONG TERM GOAL #3   Title Patient will be able to walk at least 15 minutes without being limited by her right leg pain.    Baseline limited by increased pain    Time 4    Period Weeks    Status New    Target Date 07/16/21                   Plan - 06/22/21 1151     Clinical Impression Statement Patient very tender to palpation over her right distal ITB/bursal region.  She did well with treatment today.    Personal Factors and Comorbidities Comorbidity 1;Comorbidity 3+;Comorbidity 2;Time since onset of injury/illness/exacerbation    Comorbidities OA, HTN, and  chronic kidney disease    Examination-Activity Limitations Locomotion Level;Transfers;Sit;Sleep;Stairs;Stand    Examination-Participation Restrictions Cleaning;Community Activity    Stability/Clinical Decision Making Evolving/Moderate complexity    Rehab Potential Fair    PT Frequency 2x / week    PT Duration 4 weeks    PT Treatment/Interventions ADLs/Self Care Home Management;Cryotherapy;Electrical Stimulation;Ultrasound;Moist Heat;Iontophoresis 4mg /ml Dexamethasone;Gait training;Stair training;Functional mobility training;Therapeutic activities;Therapeutic exercise;Balance training;Neuromuscular re-education;Manual techniques;Patient/family education;Energy conservation;Vasopneumatic Device    PT Next Visit Plan STM to IT band, hip isometrics    PT Home Exercise Plan Hip abduction isometrics (3x10, 5 second hold, 2x per day)    Consulted and Agree with Plan of Care Patient             Patient will benefit from skilled therapeutic intervention in order to improve the following deficits and impairments:  Difficulty walking, Decreased range of motion, Decreased activity tolerance, Pain, Decreased balance, Impaired sensation, Decreased strength  Visit Diagnosis: Pain in right leg  Muscle weakness (generalized)     Problem List Patient Active Problem List   Diagnosis Date Noted   Weakness 04/09/2020   Pure hypercholesterolemia 02/29/2020   Chronic kidney disease, stage 3b ([place]) 02/29/2020   Chronic pain of right knee 01/19/2019   DOE (dyspnea on exertion) 12/24/2018   Chronic pain of left knee 08/17/2018   Encounter for screening colonoscopy 02/27/2017   Chronic idiopathic constipation 02/07/2017   Well adult exam 08/11/2016   Acute non-recurrent maxillary sinusitis 08/11/2016   Gastroesophageal reflux disease without esophagitis 08/11/2016   Hyperlipidemia 02/14/2015   Essential hypertension 02/14/2015   Hypertension 03/30/2012    Perl Kerney, [place], PT 06/22/2021, 11:53  AM  [facility] [address] [place], [place], [address] Phone: [phone]   Fax:  [phone]  Name: Wendy Barajas MRN: [id number] Date of Birth: [birth year]

## 2021-06-26 ENCOUNTER — Ambulatory Visit: Payer: Medicare HMO

## 2021-06-26 DIAGNOSIS — M79604 Pain in right leg: Secondary | ICD-10-CM

## 2021-06-26 DIAGNOSIS — M6281 Muscle weakness (generalized): Secondary | ICD-10-CM

## 2021-06-26 NOTE — Therapy (Signed)
[facility] [place], [place], [address] Phone: [phone]   Fax:  [phone]  Physical Therapy Treatment  Patient Details  Name: Wendy Barajas MRN: [id number] Date of Birth: [birth year] Referring Provider (PT): Amie Critchley   Encounter Date: 06/26/2021   PT End of Session - 06/26/21 1516     Visit Number 3    Number of Visits 8    Date for PT Re-Evaluation 08/17/21    PT Start Time 9373    PT Stop Time 4287    PT Time Calculation (min) 55 min    Activity Tolerance Patient tolerated treatment well    Behavior During Therapy [facility] for tasks assessed/performed             Past Medical History:  Diagnosis Date   Allergy    Arthritis    bone spurs - bilateral knee issues    GERD (gastroesophageal reflux disease)    Hyperlipidemia    Hypertension     Past Surgical History:  Procedure Laterality Date   ABDOMINAL HYSTERECTOMY     COLONOSCOPY     EYE SURGERY Bilateral    cataract removal     There were no vitals filed for this visit.   Subjective Assessment - 06/26/21 1517     Subjective COVID-19 screen performed prior to patient entering clinic. Patient reports that her knee is still hurting today. She notes that driving bothers her knee.    Pertinent History OA, HTN, and chronic kidney disease    Limitations Walking;Other (comment)    How long can you sit comfortably? less than 5 minutes    How long can you stand comfortably? unable to stand comfortably    How long can you walk comfortably? unable to stand comfortably    Patient Stated Goals housework, walk to mailbox without pain (unknown distance)    Currently in Pain? Yes    Pain Score 7     Pain Location Knee    Pain Onset More than a month ago                               [facility] Adult PT Treatment/Exercise - 06/26/21 0001       Knee/Hip Exercises: Aerobic   Nustep L1 x 8 minutes      Knee/Hip Exercises: Supine   Hip  Adduction Isometric Both;20 reps    Hip Adduction Isometric Limitations Seated    Other Supine Knee/Hip Exercises Hip Abduction isometric   30 reps; 5 second hold; seated     Modalities   Modalities Electrical Stimulation      Electrical Stimulation   Electrical Stimulation Location Right ditsal ITB.    Electrical Stimulation Action Pre-mod    Electrical Stimulation Parameters 80-150 Hz x 20 minutes    Electrical Stimulation Goals Pain      Manual Therapy   Manual Therapy Soft tissue mobilization    Soft tissue mobilization R IT band                       PT Short Term Goals - 06/18/21 1242       PT SHORT TERM GOAL #1   Title Patient will report no greater than 8/10 right leg pain with functional activities.    Baseline 10/10    Time 2    Period Weeks    Status New    Target Date 07/02/21  PT Long Term Goals - 06/18/21 1240       PT LONG TERM GOAL #1   Title Patient will be independent with her HEP.    Time 4    Period Weeks    Status New    Target Date 07/16/21      PT LONG TERM GOAL #2   Title Patient will report no greater than 6/10 right leg pain with daily activities.    Baseline 10/10    Time 4    Period Weeks    Status New    Target Date 07/16/21      PT LONG TERM GOAL #3   Title Patient will be able to walk at least 15 minutes without being limited by her right leg pain.    Baseline limited by increased pain    Time 4    Period Weeks    Status New    Target Date 07/16/21                   Plan - 06/26/21 1522     Clinical Impression Statement Patient presented to treatment with continued right lateral knee pain with high pain severity and irritability. She was introduced to the nustep and isometric hip adduction for improved knee mobility and hip stability needed for improved function with daily activities. Manual therapy focused on soft tissue mobilization to the right IT band, but this was not effective at  significantly altering her symptoms. Electrical stimulation was utilized at the conclusion of today's interventions. However, she notes that this was not as effective as her previous treatment. She reported that her knee felt about the same upon the conclusion of treatment. She continues to require skilled physical therapy to address her remaining impairments to maximize her functional mobility.    Personal Factors and Comorbidities Comorbidity 1;Comorbidity 3+;Comorbidity 2;Time since onset of injury/illness/exacerbation    Comorbidities OA, HTN, and chronic kidney disease    Examination-Activity Limitations Locomotion Level;Transfers;Sit;Sleep;Stairs;Stand    Examination-Participation Restrictions Cleaning;Community Activity    Stability/Clinical Decision Making Evolving/Moderate complexity    Rehab Potential Fair    PT Frequency 2x / week    PT Duration 4 weeks    PT Treatment/Interventions ADLs/Self Care Home Management;Cryotherapy;Electrical Stimulation;Ultrasound;Moist Heat;Iontophoresis 4mg /ml Dexamethasone;Gait training;Stair training;Functional mobility training;Therapeutic activities;Therapeutic exercise;Balance training;Neuromuscular re-education;Manual techniques;Patient/family education;Energy conservation;Vasopneumatic Device    PT Next Visit Plan STM to IT band, hip isometrics    PT Home Exercise Plan Hip abduction isometrics (3x10, 5 second hold, 2x per day)    Consulted and Agree with Plan of Care Patient             Patient will benefit from skilled therapeutic intervention in order to improve the following deficits and impairments:  Difficulty walking, Decreased range of motion, Decreased activity tolerance, Pain, Decreased balance, Impaired sensation, Decreased strength  Visit Diagnosis: Pain in right leg  Muscle weakness (generalized)     Problem List Patient Active Problem List   Diagnosis Date Noted   Weakness 04/09/2020   Pure hypercholesterolemia 02/29/2020    Chronic kidney disease, stage 3b ([place]) 02/29/2020   Chronic pain of right knee 01/19/2019   DOE (dyspnea on exertion) 12/24/2018   Chronic pain of left knee 08/17/2018   Encounter for screening colonoscopy 02/27/2017   Chronic idiopathic constipation 02/07/2017   Well adult exam 08/11/2016   Acute non-recurrent maxillary sinusitis 08/11/2016   Gastroesophageal reflux disease without esophagitis 08/11/2016   Hyperlipidemia 02/14/2015   Essential hypertension 02/14/2015   Hypertension  03/30/2012    Darlin Coco, PT 06/26/2021, 6:18 PM  [facility] [place], [place], [address] Phone: [phone]   Fax:  [phone]  Name: Wendy Barajas MRN: [id number] Date of Birth: 1941-02-03

## 2021-06-29 ENCOUNTER — Encounter: Payer: Self-pay | Admitting: Physical Therapy

## 2021-06-29 ENCOUNTER — Other Ambulatory Visit: Payer: Self-pay

## 2021-06-29 ENCOUNTER — Ambulatory Visit: Payer: Medicare HMO | Admitting: Physical Therapy

## 2021-06-29 DIAGNOSIS — M6281 Muscle weakness (generalized): Secondary | ICD-10-CM

## 2021-06-29 DIAGNOSIS — M79604 Pain in right leg: Secondary | ICD-10-CM | POA: Diagnosis not present

## 2021-06-29 NOTE — Therapy (Addendum)
[facility] [place], [place], [address] Phone: [phone]   Fax:  [phone]  Physical Therapy Treatment  Patient Details  Name: Wendy Barajas MRN: [id number] Date of Birth: November 29, 1940 Referring Provider (PT): Amie Critchley   Encounter Date: 06/29/2021   PT End of Session - 06/29/21 1214     Visit Number 4    Number of Visits 8    Date for PT Re-Evaluation 08/17/21    PT Start Time 0945    PT Stop Time 1016    PT Time Calculation (min) 31 min    Activity Tolerance Patient tolerated treatment well    Behavior During Therapy [name] for tasks assessed/performed             Past Medical History:  Diagnosis Date   Allergy    Arthritis    bone spurs - bilateral knee issues    GERD (gastroesophageal reflux disease)    Hyperlipidemia    Hypertension     Past Surgical History:  Procedure Laterality Date   ABDOMINAL HYSTERECTOMY     COLONOSCOPY     EYE SURGERY Bilateral    cataract removal     There were no vitals filed for this visit.   Subjective Assessment - 06/29/21 1041     Subjective COVID-19 screen performed prior to patient entering clinic.  Pain at a 6.    Pertinent History OA, HTN, and chronic kidney disease    Limitations Walking;Other (comment)    How long can you sit comfortably? less than 5 minutes    How long can you stand comfortably? unable to stand comfortably    Patient Stated Goals housework, walk to mailbox without pain (unknown distance)    Currently in Pain? Yes    Pain Score 6     Pain Location Knee    Pain Orientation Right;Lateral    Pain Descriptors / Indicators Aching;Throbbing;Sore    Pain Type Chronic pain    Pain Onset More than a month ago                               [facility] Adult PT Treatment/Exercise - 06/29/21 0001       Exercises   Exercises Knee/Hip      Modalities   Modalities Electrical Stimulation;Moist Heat      Moist Heat Therapy    Number Minutes Moist Heat 10 Minutes    Moist Heat Location --   Right knee.     Acupuncturist Location RT distal ITB    Electrical Stimulation Action Pre-mod.    Electrical Stimulation Parameters 80-150 Hz. x 10 minutes.      Ultrasound   Ultrasound Location RT distal ITB    Ultrasound Parameters Combo e'stim/US at 1.50 W/CM2 x 8      Iontophoresis   Type of Iontophoresis Dexamethasone    Location RT distal ITB    Dose 80 mA-Min.    Time 8                       PT Short Term Goals - 06/18/21 1242       PT SHORT TERM GOAL #1   Title Patient will report no greater than 8/10 right leg pain with functional activities.    Baseline 10/10    Time 2    Period Weeks    Status New    Target Date  07/02/21               PT Long Term Goals - 06/18/21 1240       PT LONG TERM GOAL #1   Title Patient will be independent with her HEP.    Time 4    Period Weeks    Status New    Target Date 07/16/21      PT LONG TERM GOAL #2   Title Patient will report no greater than 6/10 right leg pain with daily activities.    Baseline 10/10    Time 4    Period Weeks    Status New    Target Date 07/16/21      PT LONG TERM GOAL #3   Title Patient will be able to walk at least 15 minutes without being limited by her right leg pain.    Baseline limited by increased pain    Time 4    Period Weeks    Status New    Target Date 07/16/21                   Plan - 06/29/21 1238     Clinical Impression Statement Patient's pai at a 6 upon presentation to the clinic .  Her CC is tenderness to palpation at her right distal ITB.  She did well with treatment today.    Personal Factors and Comorbidities Comorbidity 1;Comorbidity 3+;Comorbidity 2;Time since onset of injury/illness/exacerbation    Comorbidities OA, HTN, and chronic kidney disease    Examination-Activity Limitations Locomotion Level;Transfers;Sit;Sleep;Stairs;Stand     Examination-Participation Restrictions Cleaning;Community Activity    Stability/Clinical Decision Making Evolving/Moderate complexity    Rehab Potential Good    PT Frequency 2x / week    PT Duration 4 weeks    PT Treatment/Interventions ADLs/Self Care Home Management;Cryotherapy;Electrical Stimulation;Ultrasound;Moist Heat;Iontophoresis 4mg /ml Dexamethasone;Gait training;Stair training;Functional mobility training;Therapeutic activities;Therapeutic exercise;Balance training;Neuromuscular re-education;Manual techniques;Patient/family education;Energy conservation;Vasopneumatic Device    PT Next Visit Plan Modalities including Iontophoresis.    Consulted and Agree with Plan of Care Patient             Patient will benefit from skilled therapeutic intervention in order to improve the following deficits and impairments:  Difficulty walking, Decreased range of motion, Decreased activity tolerance, Pain, Decreased balance, Impaired sensation, Decreased strength  Visit Diagnosis: Pain in right leg  Muscle weakness (generalized)     Problem List Patient Active Problem List   Diagnosis Date Noted   Weakness 04/09/2020   Pure hypercholesterolemia 02/29/2020   Chronic kidney disease, stage 3b ([place]) 02/29/2020   Chronic pain of right knee 01/19/2019   DOE (dyspnea on exertion) 12/24/2018   Chronic pain of left knee 08/17/2018   Encounter for screening colonoscopy 02/27/2017   Chronic idiopathic constipation 02/07/2017   Well adult exam 08/11/2016   Acute non-recurrent maxillary sinusitis 08/11/2016   Gastroesophageal reflux disease without esophagitis 08/11/2016   Hyperlipidemia 02/14/2015   Essential hypertension 02/14/2015   Hypertension 03/30/2012    Wendy Barajas, [place], PT 06/29/2021, 12:48 PM  [facility] [address] [place], [place], [address] Phone: [phone]   Fax:  [phone]  Name: Wendy Barajas MRN: [id number] Date of  Birth: 20-Jan-1941

## 2021-07-03 ENCOUNTER — Ambulatory Visit: Payer: Medicare HMO

## 2021-07-03 ENCOUNTER — Other Ambulatory Visit: Payer: Self-pay

## 2021-07-03 DIAGNOSIS — M6281 Muscle weakness (generalized): Secondary | ICD-10-CM | POA: Diagnosis not present

## 2021-07-03 DIAGNOSIS — M79604 Pain in right leg: Secondary | ICD-10-CM | POA: Diagnosis not present

## 2021-07-03 NOTE — Therapy (Signed)
[facility] [place], [place], [address] Phone: [phone]   Fax:  [phone]  Physical Therapy Treatment  Patient Details  Name: Wendy Barajas MRN: [id number] Date of Birth: [birth year] Referring Provider (PT): Amie Critchley   Encounter Date: 07/03/2021   PT End of Session - 07/03/21 1259     Visit Number 5    Number of Visits 8    Date for PT Re-Evaluation 08/17/21    PT Start Time 1300    PT Stop Time 1347    PT Time Calculation (min) 47 min    Activity Tolerance Patient tolerated treatment well    Behavior During Therapy [facility] for tasks assessed/performed             Past Medical History:  Diagnosis Date   Allergy    Arthritis    bone spurs - bilateral knee issues    GERD (gastroesophageal reflux disease)    Hyperlipidemia    Hypertension     Past Surgical History:  Procedure Laterality Date   ABDOMINAL HYSTERECTOMY     COLONOSCOPY     EYE SURGERY Bilateral    cataract removal     There were no vitals filed for this visit.   Subjective Assessment - 07/03/21 1259     Subjective COVID-19 screen performed prior to patient entering clinic. Patient reports that her knee felt pretty good after her last appointment. However, it started hurting again after walking around shopping for about 10 minutes. She feels that her pain is worse today, but she is unsure why.    Pertinent History OA, HTN, and chronic kidney disease    Limitations Walking;Other (comment)    How long can you sit comfortably? less than 5 minutes    How long can you stand comfortably? unable to stand comfortably    Patient Stated Goals housework, walk to mailbox without pain (unknown distance)    Currently in Pain? Yes    Pain Score 8     Pain Location Knee    Pain Orientation Right;Lateral    Pain Onset More than a month ago                               [facility] Adult PT Treatment/Exercise - 07/03/21 0001        Knee/Hip Exercises: Supine   Other Supine Knee/Hip Exercises Hamstring set   3x10, 5 second hold     Modalities   Modalities Electrical Stimulation      Electrical Stimulation   Electrical Stimulation Location RT distal ITB    Electrical Stimulation Action pre-mod    Electrical Stimulation Parameters 80-150 Hz      Iontophoresis   Type of Iontophoresis Dexamethasone    Location RT distal ITB    Dose 80 mA-Min.    Time 8      Manual Therapy   Manual Therapy Soft tissue mobilization;Passive ROM    Soft tissue mobilization R IT band    Passive ROM knee flexion and extension                       PT Short Term Goals - 06/18/21 1242       PT SHORT TERM GOAL #1   Title Patient will report no greater than 8/10 right leg pain with functional activities.    Baseline 10/10    Time 2    Period Weeks  Status New    Target Date 07/02/21               PT Long Term Goals - 06/18/21 1240       PT LONG TERM GOAL #1   Title Patient will be independent with her HEP.    Time 4    Period Weeks    Status New    Target Date 07/16/21      PT LONG TERM GOAL #2   Title Patient will report no greater than 6/10 right leg pain with daily activities.    Baseline 10/10    Time 4    Period Weeks    Status New    Target Date 07/16/21      PT LONG TERM GOAL #3   Title Patient will be able to walk at least 15 minutes without being limited by her right leg pain.    Baseline limited by increased pain    Time 4    Period Weeks    Status New    Target Date 07/16/21                   Plan - 07/03/21 1300     Clinical Impression Statement Patient presented to treatment with increased right lateral knee pain today with no known cause. Treatment was initated with manual therapy with soft tissue mobilization to the hamstrings being the most effective. This was followed by hamstring isometrics which was able to further reduce her familiar posterior knee pain.  Electrical stimulation and iontophoresis were utilized to along her right Illiotibal band to further reduce her familiar knee pain. She reported that her knee felt betterupon the conclusion of treatment. she continues to require skilled physical therapy to address her remaining impairments to maximize her functional mobility.    Personal Factors and Comorbidities Comorbidity 1;Comorbidity 3+;Comorbidity 2;Time since onset of injury/illness/exacerbation    Comorbidities OA, HTN, and chronic kidney disease    Examination-Activity Limitations Locomotion Level;Transfers;Sit;Sleep;Stairs;Stand    Examination-Participation Restrictions Cleaning;Community Activity    Stability/Clinical Decision Making Evolving/Moderate complexity    Rehab Potential Good    PT Frequency Biweekly    PT Duration 4 weeks    PT Treatment/Interventions ADLs/Self Care Home Management;Cryotherapy;Electrical Stimulation;Ultrasound;Moist Heat;Iontophoresis 4mg /ml Dexamethasone;Gait training;Stair training;Functional mobility training;Therapeutic activities;Therapeutic exercise;Balance training;Neuromuscular re-education;Manual techniques;Patient/family education;Energy conservation;Vasopneumatic Device    PT Next Visit Plan Modalities including Iontophoresis.    Consulted and Agree with Plan of Care Patient             Patient will benefit from skilled therapeutic intervention in order to improve the following deficits and impairments:  Difficulty walking, Decreased range of motion, Decreased activity tolerance, Pain, Decreased balance, Impaired sensation, Decreased strength  Visit Diagnosis: Pain in right leg  Muscle weakness (generalized)     Problem List Patient Active Problem List   Diagnosis Date Noted   Weakness 04/09/2020   Pure hypercholesterolemia 02/29/2020   Chronic kidney disease, stage 3b ([place]) 02/29/2020   Chronic pain of right knee 01/19/2019   DOE (dyspnea on exertion) 12/24/2018   Chronic pain of  left knee 08/17/2018   Encounter for screening colonoscopy 02/27/2017   Chronic idiopathic constipation 02/07/2017   Well adult exam 08/11/2016   Acute non-recurrent maxillary sinusitis 08/11/2016   Gastroesophageal reflux disease without esophagitis 08/11/2016   Hyperlipidemia 02/14/2015   Essential hypertension 02/14/2015   Hypertension 03/30/2012    Darlin Coco, PT 07/03/2021, 2:13 PM  [facility] [place]  [place], [place], [address] Phone: [phone]   Fax:  [phone]  Name: Wendy Barajas MRN: [id number] Date of Birth: [birth year]

## 2021-07-10 ENCOUNTER — Ambulatory Visit: Payer: Medicare HMO | Admitting: Physical Therapy

## 2021-07-10 ENCOUNTER — Other Ambulatory Visit: Payer: Self-pay

## 2021-07-10 DIAGNOSIS — M6281 Muscle weakness (generalized): Secondary | ICD-10-CM

## 2021-07-10 DIAGNOSIS — M79604 Pain in right leg: Secondary | ICD-10-CM

## 2021-07-10 NOTE — Therapy (Signed)
[facility] [place], [place], [address] Phone: [phone]   Fax:  [phone]  Physical Therapy Treatment  Patient Details  Name: Wendy Barajas MRN: [id number] Date of Birth: Dec 29, 1940 Referring Provider (PT): Amie Critchley   Encounter Date: 07/10/2021   PT End of Session - 07/10/21 1204     Visit Number 6    Number of Visits 8    Date for PT Re-Evaluation 08/17/21    PT Start Time 1115    PT Stop Time 1155    PT Time Calculation (min) 40 min    Activity Tolerance Patient tolerated treatment well    Behavior During Therapy [facility] for tasks assessed/performed             Past Medical History:  Diagnosis Date   Allergy    Arthritis    bone spurs - bilateral knee issues    GERD (gastroesophageal reflux disease)    Hyperlipidemia    Hypertension     Past Surgical History:  Procedure Laterality Date   ABDOMINAL HYSTERECTOMY     COLONOSCOPY     EYE SURGERY Bilateral    cataract removal     There were no vitals filed for this visit.   Subjective Assessment - 07/10/21 1204     Subjective COVID-19 screen performed prior to patient entering clinic.  Knee hurts especialy while driving.    Pertinent History OA, HTN, and chronic kidney disease    Limitations Walking;Other (comment)    How long can you sit comfortably? less than 5 minutes    How long can you stand comfortably? unable to stand comfortably    How long can you walk comfortably? unable to stand comfortably    Patient Stated Goals housework, walk to mailbox without pain (unknown distance)    Currently in Pain? Yes    Pain Score 7     Pain Location Knee    Pain Orientation Right;Lateral    Pain Descriptors / Indicators Aching;Throbbing;Sore    Pain Type Chronic pain    Pain Onset More than a month ago                               [facility] Adult PT Treatment/Exercise - 07/10/21 0001       Modalities   Modalities Electrical  Stimulation;Moist Heat      Moist Heat Therapy   Number Minutes Moist Heat 12 Minutes    Moist Heat Location --   RT knee.     Acupuncturist Location RT ITB    Electrical Stimulation Action IFC at 80-150 Hz.    Electrical Stimulation Parameters 40% scan x 12 minutes.      Ultrasound   Ultrasound Location RT distal ITB    Ultrasound Parameters Combo e'stim/US at 1.50 W/CM2 x 8 minutes.      Iontophoresis   Type of Iontophoresis Dexamethasone    Location RT distal ITB    Dose 80 mA-Min.    Time 8      Manual Therapy   Manual Therapy Soft tissue mobilization    Soft tissue mobilization Gentle STW/M x 8 minutes to patient's right distal ITB                       PT Short Term Goals - 06/18/21 1242       PT SHORT TERM GOAL #1   Title Patient  will report no greater than 8/10 right leg pain with functional activities.    Baseline 10/10    Time 2    Period Weeks    Status New    Target Date 07/02/21               PT Long Term Goals - 06/18/21 1240       PT LONG TERM GOAL #1   Title Patient will be independent with her HEP.    Time 4    Period Weeks    Status New    Target Date 07/16/21      PT LONG TERM GOAL #2   Title Patient will report no greater than 6/10 right leg pain with daily activities.    Baseline 10/10    Time 4    Period Weeks    Status New    Target Date 07/16/21      PT LONG TERM GOAL #3   Title Patient will be able to walk at least 15 minutes without being limited by her right leg pain.    Baseline limited by increased pain    Time 4    Period Weeks    Status New    Target Date 07/16/21                   Plan - 07/10/21 1207     Clinical Impression Statement Patient with continued right distal ITB pain.  She was very palpably tender over this region.  Her pain increases with driving.    Personal Factors and Comorbidities Comorbidity 1;Comorbidity 3+;Comorbidity 2;Time since  onset of injury/illness/exacerbation    Comorbidities OA, HTN, and chronic kidney disease    Stability/Clinical Decision Making Evolving/Moderate complexity    Rehab Potential Good    PT Frequency 2x / week    PT Duration 4 weeks    PT Treatment/Interventions ADLs/Self Care Home Management;Cryotherapy;Electrical Stimulation;Ultrasound;Moist Heat;Iontophoresis 4mg /ml Dexamethasone;Gait training;Stair training;Functional mobility training;Therapeutic activities;Therapeutic exercise;Balance training;Neuromuscular re-education;Manual techniques;Patient/family education;Energy conservation;Vasopneumatic Device    PT Next Visit Plan Modalities including Iontophoresis.    Consulted and Agree with Plan of Care Patient             Patient will benefit from skilled therapeutic intervention in order to improve the following deficits and impairments:  Difficulty walking, Decreased range of motion, Decreased activity tolerance, Pain, Decreased balance, Impaired sensation, Decreased strength  Visit Diagnosis: Pain in right leg  Muscle weakness (generalized)     Problem List Patient Active Problem List   Diagnosis Date Noted   Weakness 04/09/2020   Pure hypercholesterolemia 02/29/2020   Chronic kidney disease, stage 3b ([place]) 02/29/2020   Chronic pain of right knee 01/19/2019   DOE (dyspnea on exertion) 12/24/2018   Chronic pain of left knee 08/17/2018   Encounter for screening colonoscopy 02/27/2017   Chronic idiopathic constipation 02/07/2017   Well adult exam 08/11/2016   Acute non-recurrent maxillary sinusitis 08/11/2016   Gastroesophageal reflux disease without esophagitis 08/11/2016   Hyperlipidemia 02/14/2015   Essential hypertension 02/14/2015   Hypertension 03/30/2012    Koby Pickup, [place], PT 07/10/2021, 12:09 PM  [facility] [address] [place], [place], [address] Phone: [phone]   Fax:  [phone]  Name: Malita Ignasiak MRN: [id number] Date of Birth: 1941-02-18

## 2021-07-11 ENCOUNTER — Other Ambulatory Visit: Payer: Self-pay | Admitting: Family Medicine

## 2021-07-11 DIAGNOSIS — I10 Essential (primary) hypertension: Secondary | ICD-10-CM

## 2021-07-13 ENCOUNTER — Other Ambulatory Visit: Payer: Self-pay

## 2021-07-13 ENCOUNTER — Ambulatory Visit: Payer: Medicare HMO | Attending: Student | Admitting: Physical Therapy

## 2021-07-13 DIAGNOSIS — M6281 Muscle weakness (generalized): Secondary | ICD-10-CM | POA: Diagnosis not present

## 2021-07-13 DIAGNOSIS — M79604 Pain in right leg: Secondary | ICD-10-CM | POA: Insufficient documentation

## 2021-07-13 NOTE — Therapy (Signed)
[facility] [place], [place], [address] Phone: [phone]   Fax:  [phone]  Physical Therapy Treatment  Patient Details  Name: Wendy Barajas MRN: [id number] Date of Birth: 02/07/1941 Referring Provider (PT): Amie Critchley   Encounter Date: 07/13/2021   PT End of Session - 07/13/21 1209     Visit Number 7    Number of Visits 8    Date for PT Re-Evaluation 08/17/21    PT Start Time 0815    PT Stop Time 0900    PT Time Calculation (min) 45 min    Activity Tolerance Patient tolerated treatment well    Behavior During Therapy [facility] for tasks assessed/performed             Past Medical History:  Diagnosis Date   Allergy    Arthritis    bone spurs - bilateral knee issues    GERD (gastroesophageal reflux disease)    Hyperlipidemia    Hypertension     Past Surgical History:  Procedure Laterality Date   ABDOMINAL HYSTERECTOMY     COLONOSCOPY     EYE SURGERY Bilateral    cataract removal     There were no vitals filed for this visit.   Subjective Assessment - 07/13/21 0858     Subjective COVID-19 screen performed prior to patient entering clinic.  Pain is only a 2 today and patient pleased she is walking better.                               [facility] Adult PT Treatment/Exercise - 07/13/21 0001       Modalities   Modalities Electrical Stimulation;Moist Heat;Ultrasound      Moist Heat Therapy   Number Minutes Moist Heat 20 Minutes    Moist Heat Location --   Right knee.     Acupuncturist Location RT ITB region.    Electrical Stimulation Action IFC at 80-150 Hz.    Electrical Stimulation Parameters 40% scan x 20 minutes.      Ultrasound   Ultrasound Location RT distal ITB    Ultrasound Parameters Combo e'stim/US at 1.50 W/CM2 x 8 minutes.      Manual Therapy   Manual Therapy Soft tissue mobilization    Soft tissue mobilization Gentle STW/M x 7  minutes to patient's right distal ITB region.                       PT Short Term Goals - 06/18/21 1242       PT SHORT TERM GOAL #1   Title Patient will report no greater than 8/10 right leg pain with functional activities.    Baseline 10/10    Time 2    Period Weeks    Status New    Target Date 07/02/21               PT Long Term Goals - 06/18/21 1240       PT LONG TERM GOAL #1   Title Patient will be independent with her HEP.    Time 4    Period Weeks    Status New    Target Date 07/16/21      PT LONG TERM GOAL #2   Title Patient will report no greater than 6/10 right leg pain with daily activities.    Baseline 10/10    Time 4    Period  Weeks    Status New    Target Date 07/16/21      PT LONG TERM GOAL #3   Title Patient will be able to walk at least 15 minutes without being limited by her right leg pain.    Baseline limited by increased pain    Time 4    Period Weeks    Status New    Target Date 07/16/21                   Plan - 07/13/21 1210     Clinical Impression Statement Patient feeling much better with a significant improvement in her gait today.  She had less palpable pain over the bursa but continued pain over the distal ITB.             Patient will benefit from skilled therapeutic intervention in order to improve the following deficits and impairments:     Visit Diagnosis: Pain in right leg  Muscle weakness (generalized)     Problem List Patient Active Problem List   Diagnosis Date Noted   Weakness 04/09/2020   Pure hypercholesterolemia 02/29/2020   Chronic kidney disease, stage 3b ([place]) 02/29/2020   Chronic pain of right knee 01/19/2019   DOE (dyspnea on exertion) 12/24/2018   Chronic pain of left knee 08/17/2018   Encounter for screening colonoscopy 02/27/2017   Chronic idiopathic constipation 02/07/2017   Well adult exam 08/11/2016   Acute non-recurrent maxillary sinusitis 08/11/2016    Gastroesophageal reflux disease without esophagitis 08/11/2016   Hyperlipidemia 02/14/2015   Essential hypertension 02/14/2015   Hypertension 03/30/2012    Dwight Adamczak, [place], PT 07/13/2021, 12:21 PM  [facility] [address] [place], [place], [address] Phone: [phone]   Fax:  [phone]  Name: Wendy Barajas MRN: [id number] Date of Birth: Aug 29, 1940

## 2021-07-18 ENCOUNTER — Other Ambulatory Visit: Payer: Self-pay

## 2021-07-18 ENCOUNTER — Ambulatory Visit: Payer: Medicare HMO | Admitting: Physical Therapy

## 2021-07-18 DIAGNOSIS — M6281 Muscle weakness (generalized): Secondary | ICD-10-CM | POA: Diagnosis not present

## 2021-07-18 DIAGNOSIS — M79604 Pain in right leg: Secondary | ICD-10-CM | POA: Diagnosis not present

## 2021-07-18 NOTE — Therapy (Signed)
Wendy Barajas [place], [place], [address] Phone: [phone]   Fax:  [phone]  Physical Therapy Treatment  Patient Details  Name: Wendy Barajas MRN: [id number] Date of Birth: 03-10-1941 Referring Provider (PT): Amie Critchley   Encounter Date: 07/18/2021   PT End of Session - 07/18/21 1255     Visit Number 8    Number of Visits 12    PT Start Time 4536    PT Stop Time 4680    PT Time Calculation (min) 47 min    Activity Tolerance Patient tolerated treatment well    Behavior During Therapy [facility] for tasks assessed/performed             Past Medical History:  Diagnosis Date   Allergy    Arthritis    bone spurs - bilateral knee issues    GERD (gastroesophageal reflux disease)    Hyperlipidemia    Hypertension     Past Surgical History:  Procedure Laterality Date   ABDOMINAL HYSTERECTOMY     COLONOSCOPY     EYE SURGERY Bilateral    cataract removal     There were no vitals filed for this visit.   Subjective Assessment - 07/18/21 1258     Subjective COVID-19 screen performed prior to patient entering clinic.  Knee knees feels much better.    Pertinent History OA, HTN, and chronic kidney disease    Limitations Walking;Other (comment)    How long can you sit comfortably? less than 5 minutes    How long can you stand comfortably? unable to stand comfortably    How long can you walk comfortably? unable to stand comfortably    Patient Stated Goals housework, walk to mailbox without pain (unknown distance)    Currently in Pain? Yes    Pain Score 2     Pain Location Knee    Pain Orientation Right;Lateral    Pain Descriptors / Indicators Dull;Aching    Pain Onset More than a month ago                               [facility] Adult PT Treatment/Exercise - 07/18/21 0001       Moist Heat Therapy   Number Minutes Moist Heat 15 Minutes      Electrical Stimulation   Electrical Stimulation  Location RT distal ITB    Electrical Stimulation Action IFC at 80-150 Hz.    Electrical Stimulation Parameters 40% scan x 15 minutes.      Ultrasound   Ultrasound Location RT distal ITB    Ultrasound Parameters Small soundhead combo e'stim/US at 1.50 W/CM2 x 12 minutes to patient's right distal ITB      Iontophoresis   Type of Iontophoresis Dexamethasone    Location RT distal ITB    Dose 80 mA-min.    Time 8      Manual Therapy   Manual Therapy Soft tissue mobilization    Soft tissue mobilization Gentle STW/M x  to patient right distal ITB x 8 minutes.                       PT Short Term Goals - 06/18/21 1242       PT SHORT TERM GOAL #1   Title Patient will report no greater than 8/10 right leg pain with functional activities.    Baseline 10/10    Time 2    Period Weeks  Status New    Target Date 07/02/21               PT Long Term Goals - 06/18/21 1240       PT LONG TERM GOAL #1   Title Patient will be independent with her HEP.    Time 4    Period Weeks    Status New    Target Date 07/16/21      PT LONG TERM GOAL #2   Title Patient will report no greater than 6/10 right leg pain with daily activities.    Baseline 10/10    Time 4    Period Weeks    Status New    Target Date 07/16/21      PT LONG TERM GOAL #3   Title Patient will be able to walk at least 15 minutes without being limited by her right leg pain.    Baseline limited by increased pain    Time 4    Period Weeks    Status New    Target Date 07/16/21                   Plan - 07/18/21 1343     Clinical Impression Statement Patient pain has been consistently lower as of late.  She was much less palpably tender over her right distal ITB region today.  Her gait was non-antalgic in nature today.    Personal Factors and Comorbidities Comorbidity 1;Comorbidity 3+;Comorbidity 2;Time since onset of injury/illness/exacerbation    Comorbidities OA, HTN, and chronic kidney  disease    Examination-Activity Limitations Locomotion Level;Transfers;Sit;Sleep;Stairs;Stand    Rehab Potential Good    PT Treatment/Interventions ADLs/Self Care Home Management;Cryotherapy;Electrical Stimulation;Ultrasound;Moist Heat;Iontophoresis 4mg /ml Dexamethasone;Gait training;Stair training;Functional mobility training;Therapeutic activities;Therapeutic exercise;Balance training;Neuromuscular re-education;Manual techniques;Patient/family education;Energy conservation;Vasopneumatic Device    PT Next Visit Plan Modalities including Iontophoresis.    Consulted and Agree with Plan of Care Patient             Patient will benefit from skilled therapeutic intervention in order to improve the following deficits and impairments:  Difficulty walking, Decreased range of motion, Decreased activity tolerance, Pain, Decreased balance, Impaired sensation, Decreased strength  Visit Diagnosis: Pain in right leg - Plan: PT plan of care cert/re-cert  Muscle weakness (generalized) - Plan: PT plan of care cert/re-cert     Problem List Patient Active Problem List   Diagnosis Date Noted   Weakness 04/09/2020   Pure hypercholesterolemia 02/29/2020   Chronic kidney disease, stage 3b ([place]) 02/29/2020   Chronic pain of right knee 01/19/2019   DOE (dyspnea on exertion) 12/24/2018   Chronic pain of left knee 08/17/2018   Encounter for screening colonoscopy 02/27/2017   Chronic idiopathic constipation 02/07/2017   Well adult exam 08/11/2016   Acute non-recurrent maxillary sinusitis 08/11/2016   Gastroesophageal reflux disease without esophagitis 08/11/2016   Hyperlipidemia 02/14/2015   Essential hypertension 02/14/2015   Hypertension 03/30/2012    Wendy Barajas, [place], PT 07/18/2021, 1:55 PM  [place] [facility] [address] [place], [place], [address] Phone: [phone]   Fax:  [phone]  Name: Wendy Barajas MRN: [id number] Date of Birth:  [birth year]

## 2021-07-25 ENCOUNTER — Ambulatory Visit: Payer: Medicare HMO

## 2021-07-25 ENCOUNTER — Other Ambulatory Visit: Payer: Self-pay

## 2021-07-25 DIAGNOSIS — M6281 Muscle weakness (generalized): Secondary | ICD-10-CM | POA: Diagnosis not present

## 2021-07-25 DIAGNOSIS — M79604 Pain in right leg: Secondary | ICD-10-CM | POA: Diagnosis not present

## 2021-07-25 NOTE — Therapy (Addendum)
[facility] [place], [place], [address] Phone: [phone]   Fax:  [phone]  Physical Therapy Treatment  Patient Details  Name: Brihana Quickel MRN: [id number] Date of Birth: [birth year] Referring Provider (PT): Amie Critchley   Encounter Date: 07/25/2021   PT End of Session - 07/25/21 1031     Visit Number 9    Number of Visits 12    PT Start Time 6387    PT Stop Time 5643    PT Time Calculation (min) 53 min    Activity Tolerance Patient tolerated treatment well    Behavior During Therapy WFL for tasks assessed/performed             Past Medical History:  Diagnosis Date   Allergy    Arthritis    bone spurs - bilateral knee issues    GERD (gastroesophageal reflux disease)    Hyperlipidemia    Hypertension     Past Surgical History:  Procedure Laterality Date   ABDOMINAL HYSTERECTOMY     COLONOSCOPY     EYE SURGERY Bilateral    cataract removal     There were no vitals filed for this visit.   Subjective Assessment - 07/25/21 1032     Subjective COVID-19 screen performed prior to patient entering clinic.  Patient reports that her leg started hurting yesterday. She notes that she did a lot of standing and cleanning on Monday, but her leg was hurting all day yesterday. She notes that she had one day of free pain last week, but is unsure of the date.    Pertinent History OA, HTN, and chronic kidney disease    Limitations Walking;Other (comment)    How long can you sit comfortably? less than 5 minutes    How long can you stand comfortably? unable to stand comfortably    How long can you walk comfortably? unable to stand comfortably    Patient Stated Goals housework, walk to mailbox without pain (unknown distance)    Currently in Pain? Yes    Pain Score 9     Pain Location Leg    Pain Orientation Right;Lateral    Pain Descriptors / Indicators --   "just hurts"   Pain Type Chronic pain    Pain Onset More  than a month ago                               [facility] Adult PT Treatment/Exercise - 07/25/21 0001       Modalities   Modalities Electrical Stimulation;Moist Heat      Moist Heat Therapy   Number Minutes Moist Heat 15 Minutes    Moist Heat Location Knee   to right knee in left sidelying     Electrical Stimulation   Electrical Stimulation Location RT distal ITB    Electrical Stimulation Action pre mod    Electrical Stimulation Parameters 80-150 Hz x 20 minutes    Electrical Stimulation Goals Pain      Manual Therapy   Manual Therapy Soft tissue mobilization    Soft tissue mobilization Gentle STW/M x  to patient right distal ITB, gastroc, and quadriceps                       PT Short Term Goals - 06/18/21 1242       PT SHORT TERM GOAL #1   Title Patient will report no greater than 8/10 right  leg pain with functional activities.    Baseline 10/10    Time 2    Period Weeks    Status New    Target Date 07/02/21               PT Long Term Goals - 06/18/21 1240       PT LONG TERM GOAL #1   Title Patient will be independent with her HEP.    Time 4    Period Weeks    Status New    Target Date 07/16/21      PT LONG TERM GOAL #2   Title Patient will report no greater than 6/10 right leg pain with daily activities.    Baseline 10/10    Time 4    Period Weeks    Status New    Target Date 07/16/21      PT LONG TERM GOAL #3   Title Patient will be able to walk at least 15 minutes without being limited by her right leg pain.    Baseline limited by increased pain    Time 4    Period Weeks    Status New    Target Date 07/16/21                   Plan - 07/25/21 1108     Clinical Impression Statement Patient presented to treatment with a significant increase in her familiar symptoms which limited her ability to be progressed or introduced to any new or familiar interventions. manual therapy was utilized in an attempt to  reduce her familiar pain. However, this was unable to provide significant relief to her familiar pain. She experienced most of her pain and tenderness to palpation along her right IT band, quadriceps, and gastroc. She reported that her leg felt better upon the conclusion of treatment. She continues to require skilled physical therapy to address her remaining impairments to maximize her functional mobility.    Personal Factors and Comorbidities Comorbidity 1;Comorbidity 3+;Comorbidity 2;Time since onset of injury/illness/exacerbation    Comorbidities OA, HTN, and chronic kidney disease    Examination-Activity Limitations Locomotion Level;Transfers;Sit;Sleep;Stairs;Stand    Rehab Potential Good    PT Treatment/Interventions ADLs/Self Care Home Management;Cryotherapy;Electrical Stimulation;Ultrasound;Moist Heat;Iontophoresis 73m/ml Dexamethasone;Gait training;Stair training;Functional mobility training;Therapeutic activities;Therapeutic exercise;Balance training;Neuromuscular re-education;Manual techniques;Patient/family education;Energy conservation;Vasopneumatic Device    PT Next Visit Plan Modalities including Iontophoresis.    Consulted and Agree with Plan of Care Patient             Patient will benefit from skilled therapeutic intervention in order to improve the following deficits and impairments:  Difficulty walking, Decreased range of motion, Decreased activity tolerance, Pain, Decreased balance, Impaired sensation, Decreased strength  Visit Diagnosis: Pain in right leg  Muscle weakness (generalized)     Problem List Patient Active Problem List   Diagnosis Date Noted   Weakness 04/09/2020   Pure hypercholesterolemia 02/29/2020   Chronic [place] disease, stage 3b [place] 02/29/2020   Chronic pain of right knee 01/19/2019   DOE (dyspnea on exertion) 12/24/2018   Chronic pain of left knee 08/17/2018   Encounter for screening colonoscopy 02/27/2017   Chronic idiopathic constipation  02/07/2017   Well adult exam 08/11/2016   Acute non-recurrent maxillary sinusitis 08/11/2016   Gastroesophageal reflux disease without esophagitis 08/11/2016   Hyperlipidemia 02/14/2015   Essential hypertension 02/14/2015   Hypertension 03/30/2012    JDarlin Coco PT 07/25/2021, 11:26 AM  [facility] [address] [place] Phone: [phone]   Fax:  [phone]  Name: Khalila Buechner MRN: [id number] Date of Birth: 1941/06/28  PHYSICAL THERAPY DISCHARGE SUMMARY  Visits from Start of Care: 9  Current functional level related to goals / functional outcomes: Patient is being discharged at this time after not attending any appointments after 07/25/21. She had not met any of her goals in the nine visits that she had attended.    Remaining deficits: Pain and mobility   Education / Equipment: HEP    Patient agrees to discharge. Patient goals were not met. Patient is being discharged due to not returning since the last visit.

## 2021-07-27 DIAGNOSIS — M170 Bilateral primary osteoarthritis of knee: Secondary | ICD-10-CM | POA: Diagnosis not present

## 2021-07-27 DIAGNOSIS — M7631 Iliotibial band syndrome, right leg: Secondary | ICD-10-CM | POA: Diagnosis not present

## 2021-09-07 ENCOUNTER — Other Ambulatory Visit: Payer: Self-pay | Admitting: Family Medicine

## 2021-09-07 DIAGNOSIS — L304 Erythema intertrigo: Secondary | ICD-10-CM

## 2021-09-07 DIAGNOSIS — I10 Essential (primary) hypertension: Secondary | ICD-10-CM

## 2021-09-07 DIAGNOSIS — E7800 Pure hypercholesterolemia, unspecified: Secondary | ICD-10-CM

## 2021-09-07 DIAGNOSIS — K219 Gastro-esophageal reflux disease without esophagitis: Secondary | ICD-10-CM

## 2021-10-24 ENCOUNTER — Ambulatory Visit (INDEPENDENT_AMBULATORY_CARE_PROVIDER_SITE_OTHER): Payer: Medicare HMO | Admitting: Family Medicine

## 2021-10-24 ENCOUNTER — Encounter: Payer: Self-pay | Admitting: Family Medicine

## 2021-10-24 VITALS — BP 149/77 | HR 80 | Temp 97.9°F | Ht 63.0 in | Wt 181.5 lb

## 2021-10-24 DIAGNOSIS — I10 Essential (primary) hypertension: Secondary | ICD-10-CM | POA: Diagnosis not present

## 2021-10-24 DIAGNOSIS — K219 Gastro-esophageal reflux disease without esophagitis: Secondary | ICD-10-CM | POA: Diagnosis not present

## 2021-10-24 DIAGNOSIS — E7800 Pure hypercholesterolemia, unspecified: Secondary | ICD-10-CM

## 2021-10-24 DIAGNOSIS — N1832 Chronic kidney disease, stage 3b: Secondary | ICD-10-CM | POA: Diagnosis not present

## 2021-10-24 MED ORDER — LOSARTAN POTASSIUM 50 MG PO TABS: 50.0000 mg | ORAL_TABLET | Freq: Every day | ORAL | 3 refills | Status: DC

## 2021-10-24 MED ORDER — RABEPRAZOLE SODIUM 20 MG PO TBEC: 20.0000 mg | DELAYED_RELEASE_TABLET | Freq: Every day | ORAL | 3 refills | Status: DC

## 2021-10-24 MED ORDER — AMLODIPINE BESYLATE 2.5 MG PO TABS: ORAL_TABLET | ORAL | 3 refills | Status: DC

## 2021-10-24 NOTE — Progress Notes (Signed)
? ?Subjective: ?CC: HTN w/ CKD3b, HLD ?PCP: [name], DO ?Wendy Barajas:[id number] Wendy Barajas is a 81 y.o. female presenting to clinic today for: ? ?1. HTN w/ HLD and CKD3b ?Patient last seen in September.  At that time her renal function was stable with GFR 44.  Her blood pressure was noted to be elevated but this was thought to be secondary to arthritic flareup.  She continues to struggle with bilateral knee pain and leg pain but she "just deals with it".  She has chronic dyspnea on exertion.  This is been worked up previously.  She admits that she does not exercise much at all because of her chronic leg issues.  She has not tried any seated exercises but she would be willing to do this. ? ? ?ROS: Per HPI ? ?Allergies  ?Allergen Reactions  ? Pollen Extract   ? ?Past Medical History:  ?Diagnosis Date  ? Allergy   ? Arthritis   ? bone spurs - bilateral knee issues   ? GERD (gastroesophageal reflux disease)   ? Hyperlipidemia   ? Hypertension   ? ? ?Current Outpatient Medications:  ?  nystatin cream (MYCOSTATIN), APPLY 1 APPLICATION TOPICALLY TWO TIMES DAILY TO THE GROIN AREAS X 7-14 DAYS PER FLARE., Disp: 30 g, Rfl: 0 ?  amLODipine (NORVASC) 2.5 MG tablet, TAKE 1 TABLET EVERY DAY (DISCONTINUE $RemoveBeforeDE'5MG'vEgYrbYRvGLaYyb$  TABLET), Disp: 90 tablet, Rfl: 0 ?  calcium carbonate (OS-CAL - DOSED IN MG OF ELEMENTAL CALCIUM) 1250 (500 Ca) MG tablet, Take 1 tablet by mouth., Disp: , Rfl:  ?  Capsaicin-Menthol-Methyl Sal (CAPSAICIN-METHYL SAL-MENTHOL) 0.025-1-12 % CREA, Apply 1 application topically 4 (four) times daily as needed., Disp: 56.6 g, Rfl: 3 ?  Cholecalciferol (VITAMIN D3) 400 UNITS CAPS, Take 1 capsule by mouth daily. , Disp: , Rfl:  ?  loratadine (CLARITIN) 10 MG tablet, TAKE 1 TABLET EVERY DAY, Disp: 90 tablet, Rfl: 3 ?  losartan (COZAAR) 50 MG tablet, TAKE 1 TABLET EVERY DAY, Disp: 90 tablet, Rfl: 0 ?  RABEprazole (ACIPHEX) 20 MG tablet, TAKE 1 TABLET EVERY DAY, Disp: 90 tablet, Rfl: 0 ?  simvastatin (ZOCOR) 20 MG tablet, TAKE 1 TABLET EVERY  DAY, Disp: 90 tablet, Rfl: 0 ?Social History  ? ?Socioeconomic History  ? Marital status: Widowed  ?  Spouse name: Not on file  ? Number of children: 1  ? Years of education: Not on file  ? Highest education level: 11th grade  ?Occupational History  ? Occupation: Retired   ?Tobacco Use  ? Smoking status: Never  ? Smokeless tobacco: Never  ?Vaping Use  ? Vaping Use: Never used  ?Substance and Sexual Activity  ? Alcohol use: No  ?  Alcohol/week: 0.0 standard drinks  ? Drug use: No  ? Sexual activity: Not Currently  ?Other Topics Concern  ? Not on file  ?Social History Narrative  ? Live alone; Her son lives nearby  ? ?Social Determinants of Health  ? ?Financial Resource Strain: Low Risk   ? Difficulty of Paying Living Expenses: Not hard at all  ?Food Insecurity: No Food Insecurity  ? Worried About Charity fundraiser in the Last Year: Never true  ? Ran Out of Food in the Last Year: Never true  ?Transportation Needs: No Transportation Needs  ? Lack of Transportation (Medical): No  ? Lack of Transportation (Non-Medical): No  ?Physical Activity: Insufficiently Active  ? Days of Exercise per Week: 7 days  ? Minutes of Exercise per Session: 10 min  ?Stress: No  Stress Concern Present  ? Feeling of Stress : Not at all  ?Social Connections: Moderately Integrated  ? Frequency of Communication with Friends and Family: More than three times a week  ? Frequency of Social Gatherings with Friends and Family: More than three times a week  ? Attends Religious Services: More than 4 times per year  ? Active Member of Clubs or Organizations: No  ? Attends Archivist Meetings: 1 to 4 times per year  ? Marital Status: Widowed  ?Intimate Partner Violence: Not At Risk  ? Fear of Current or Ex-Partner: No  ? Emotionally Abused: No  ? Physically Abused: No  ? Sexually Abused: No  ? ?Family History  ?Problem Relation Age of Onset  ? Cancer Father   ?     unsure = thinks colon   ? Diabetes Mother   ? Heart disease Mother   ? Cancer  Brother   ?     unsure   ? Stroke Brother   ? Arthritis Sister   ?     back issues   ? ? ?Objective: ?Office vital signs reviewed. ?BP (!) 149/77   Pulse 80   Temp 97.9 ?F (36.6 ?C) (Temporal)   Ht $R'5\' 3"'oH$  (1.6 m)   Wt 181 lb 8 oz (82.3 kg)   BMI 32.15 kg/m?  ? ?Physical Examination:  ?General: Awake, alert, well nourished, No acute distress ?HEENT: Sclera white.  Moist mucous membranes ?Cardio: regular rate and rhythm, S1S2 heard, no murmurs appreciated ?Pulm: clear to auscultation bilaterally, no wheezes, rhonchi or rales; normal work of breathing on room air ?Extremities: warm, well perfused, No edema, cyanosis or clubbing; +2 pulses bilaterally ?MSK: Ambulating independently but gait is antalgic and stiff ? ?Assessment/ Plan: ?81 y.o. female  ? ?Chronic kidney disease, stage 3b ([place]) - Plan: CMP14+EGFR, CBC, VITAMIN D 25 Hydroxy (Vit-D Deficiency, Fractures) ? ?Essential hypertension - Plan: CMP14+EGFR, losartan (COZAAR) 50 MG tablet, amLODipine (NORVASC) 2.5 MG tablet ? ?Pure hypercholesterolemia - Plan: Lipid Panel, CMP14+EGFR ? ?Gastroesophageal reflux disease without esophagitis - Plan: RABEprazole (ACIPHEX) 20 MG tablet ? ?Check renal function, electrolytes, CBC, vitamin D given CKD 3B ? ?Blood pressure is borderline but she has not taken her blood pressure medicines.  I encouraged her to utilize these prior to her appointments ? ?Fasting lipid ordered ? ?GERD has been uncontrolled with labs and Aciphex.  No contraindication use given impaired renal function.  This is been renewed ? ?No orders of the defined types were placed in this encounter. ? ?No orders of the defined types were placed in this encounter. ? ? ? ?[name], DO ?[place] ?[phone] ? ? ?

## 2021-10-24 NOTE — Patient Instructions (Signed)

## 2021-10-25 LAB — VITAMIN D 25 HYDROXY (VIT D DEFICIENCY, FRACTURES): Vit D, 25-Hydroxy: 43.1 ng/mL (ref 30.0–100.0)

## 2021-10-25 LAB — CMP14+EGFR
ALT: 9 IU/L (ref 0–32)
AST: 15 IU/L (ref 0–40)
Albumin/Globulin Ratio: 1.4 (ref 1.2–2.2)
Albumin: 4.2 g/dL (ref 3.6–4.6)
Alkaline Phosphatase: 110 IU/L (ref 44–121)
BUN/Creatinine Ratio: 11 — ABNORMAL LOW (ref 12–28)
BUN: 17 mg/dL (ref 8–27)
Bilirubin Total: 0.7 mg/dL (ref 0.0–1.2)
CO2: 23 mmol/L (ref 20–29)
Calcium: 9.9 mg/dL (ref 8.7–10.3)
Chloride: 105 mmol/L (ref 96–106)
Creatinine, Ser: 1.51 mg/dL — ABNORMAL HIGH (ref 0.57–1.00)
Globulin, Total: 2.9 g/dL (ref 1.5–4.5)
Glucose: 104 mg/dL — ABNORMAL HIGH (ref 70–99)
Potassium: 4.8 mmol/L (ref 3.5–5.2)
Sodium: 143 mmol/L (ref 134–144)
Total Protein: 7.1 g/dL (ref 6.0–8.5)
eGFR: 35 mL/min/{1.73_m2} — ABNORMAL LOW (ref >59)

## 2021-10-25 LAB — CBC
Hematocrit: 37.3 % (ref 34.0–46.6)
Hemoglobin: 12.4 g/dL (ref 11.1–15.9)
MCH: 29.7 pg (ref 26.6–33.0)
MCHC: 33.2 g/dL (ref 31.5–35.7)
MCV: 89 fL (ref 79–97)
Platelets: 287 10*3/uL (ref 150–450)
RBC: 4.18 x10E6/uL (ref 3.77–5.28)
RDW: 14.2 % (ref 11.7–15.4)
WBC: 9.6 10*3/uL (ref 3.4–10.8)

## 2021-10-25 LAB — LIPID PANEL
Chol/HDL Ratio: 3.7 ratio (ref 0.0–4.4)
Cholesterol, Total: 168 mg/dL (ref 100–199)
HDL: 45 mg/dL (ref >39)
LDL Chol Calc (NIH): 100 mg/dL — ABNORMAL HIGH (ref 0–99)
Triglycerides: 129 mg/dL (ref 0–149)
VLDL Cholesterol Cal: 23 mg/dL (ref 5–40)

## 2021-11-21 ENCOUNTER — Other Ambulatory Visit: Payer: Self-pay | Admitting: Family Medicine

## 2021-11-22 DIAGNOSIS — M25562 Pain in left knee: Secondary | ICD-10-CM | POA: Diagnosis not present

## 2021-11-22 DIAGNOSIS — I129 Hypertensive chronic kidney disease with stage 1 through stage 4 chronic kidney disease, or unspecified chronic kidney disease: Secondary | ICD-10-CM | POA: Diagnosis not present

## 2021-11-22 DIAGNOSIS — K219 Gastro-esophageal reflux disease without esophagitis: Secondary | ICD-10-CM | POA: Diagnosis not present

## 2021-11-22 DIAGNOSIS — N1832 Chronic kidney disease, stage 3b: Secondary | ICD-10-CM | POA: Diagnosis not present

## 2021-11-22 DIAGNOSIS — Z87442 Personal history of urinary calculi: Secondary | ICD-10-CM | POA: Diagnosis not present

## 2021-11-22 DIAGNOSIS — G8929 Other chronic pain: Secondary | ICD-10-CM | POA: Diagnosis not present

## 2022-01-31 ENCOUNTER — Other Ambulatory Visit: Payer: Self-pay | Admitting: Family Medicine

## 2022-01-31 DIAGNOSIS — E7800 Pure hypercholesterolemia, unspecified: Secondary | ICD-10-CM

## 2022-03-07 ENCOUNTER — Ambulatory Visit (INDEPENDENT_AMBULATORY_CARE_PROVIDER_SITE_OTHER): Payer: Medicare HMO

## 2022-03-07 VITALS — Wt 181.0 lb

## 2022-03-07 DIAGNOSIS — Z0000 Encounter for general adult medical examination without abnormal findings: Secondary | ICD-10-CM

## 2022-03-07 NOTE — Patient Instructions (Signed)
[name] , Thank you for taking time to come for your Medicare Wellness Visit. I appreciate your ongoing commitment to your health goals. Please review the following plan we discussed and let me know if I can assist you in the future.   Screening recommendations/referrals: Colonoscopy: Done 02/27/2017 - no repeat required Mammogram: Done 2015 - declines repeat - not required Bone Density: Done 10/30/2017 - Repeat every 2 years *declines Recommended yearly ophthalmology/optometry visit for glaucoma screening and checkup Recommended yearly dental visit for hygiene and checkup  Vaccinations: declines all Influenza vaccine: recommend every Fall Pneumococcal vaccine: recommend once per lifetime Prevnar-20 Tdap vaccine: Done 02/12/2019 - recommend every 10 years Shingles vaccine: recommend Shingrix which is 2 doses 2-6 months apart and over 90% effective     Covid-19: recommend 2 doses one month apart with a booster 6 months later   Advanced directives: Advance directive discussed with you today. Even though you declined this today, please call our office should you change your mind, and we can give you the proper paperwork for you to fill out.   Conditions/risks identified: Aim for 30 minutes of exercise or brisk walking, 6-8 glasses of water, and 5 servings of fruits and vegetables each day.   Next appointment: Follow up in one year for your annual wellness visit    Preventive Care 65 Years and Older, Female Preventive care refers to lifestyle choices and visits with your health care provider that can promote health and wellness. What does preventive care include? A yearly physical exam. This is also called an annual well check. Dental exams once or twice a year. Routine eye exams. Ask your health care provider how often you should have your eyes checked. Personal lifestyle choices, including: Daily care of your teeth and gums. Regular physical activity. Eating a healthy diet. Avoiding  tobacco and drug use. Limiting alcohol use. Practicing safe sex. Taking low-dose aspirin every day. Taking vitamin and mineral supplements as recommended by your health care provider. What happens during an annual well check? The services and screenings done by your health care provider during your annual well check will depend on your age, overall health, lifestyle risk factors, and family history of disease. Counseling  Your health care provider may ask you questions about your: Alcohol use. Tobacco use. Drug use. Emotional well-being. Home and relationship well-being. Sexual activity. Eating habits. History of falls. Memory and ability to understand (cognition). Work and work Statistician. Reproductive health. Screening  You may have the following tests or measurements: Height, weight, and BMI. Blood pressure. Lipid and cholesterol levels. These may be checked every 5 years, or more frequently if you are over 107 years old. Skin check. Lung cancer screening. You may have this screening every year starting at age 37 if you have a 30-pack-year history of smoking and currently smoke or have quit within the past 15 years. Fecal occult blood test (FOBT) of the stool. You may have this test every year starting at age 66. Flexible sigmoidoscopy or colonoscopy. You may have a sigmoidoscopy every 5 years or a colonoscopy every 10 years starting at age 55. Hepatitis C blood test. Hepatitis B blood test. Sexually transmitted disease (STD) testing. Diabetes screening. This is done by checking your blood sugar (glucose) after you have not eaten for a while (fasting). You may have this done every 1-3 years. Bone density scan. This is done to screen for osteoporosis. You may have this done starting at age 13. Mammogram. This may be done every  1-2 years. Talk to your health care provider about how often you should have regular mammograms. Talk with your health care provider about your test  results, treatment options, and if necessary, the need for more tests. Vaccines  Your health care provider may recommend certain vaccines, such as: Influenza vaccine. This is recommended every year. Tetanus, diphtheria, and acellular pertussis (Tdap, Td) vaccine. You may need a Td booster every 10 years. Zoster vaccine. You may need this after age 56. Pneumococcal 13-valent conjugate (PCV13) vaccine. One dose is recommended after age 50. Pneumococcal polysaccharide (PPSV23) vaccine. One dose is recommended after age 2. Talk to your health care provider about which screenings and vaccines you need and how often you need them. This information is not intended to replace advice given to you by your health care provider. Make sure you discuss any questions you have with your health care provider. Document Released: 08/25/2015 Document Revised: 04/17/2016 Document Reviewed: 05/30/2015 Elsevier Interactive Patient Education  2017 [employer] Prevention in the Home Falls can cause injuries. They can happen to people of all ages. There are many things you can do to make your home safe and to help prevent falls. What can I do on the outside of my home? Regularly fix the edges of walkways and driveways and fix any cracks. Remove anything that might make you trip as you walk through a door, such as a raised step or threshold. Trim any bushes or trees on the path to your home. Use bright outdoor lighting. Clear any walking paths of anything that might make someone trip, such as rocks or tools. Regularly check to see if handrails are loose or broken. Make sure that both sides of any steps have handrails. Any raised decks and porches should have guardrails on the edges. Have any leaves, snow, or ice cleared regularly. Use sand or salt on walking paths during winter. Clean up any spills in your garage right away. This includes oil or grease spills. What can I do in the bathroom? Use night  lights. Install grab bars by the toilet and in the tub and shower. Do not use towel bars as grab bars. Use non-skid mats or decals in the tub or shower. If you need to sit down in the shower, use a plastic, non-slip stool. Keep the floor dry. Clean up any water that spills on the floor as soon as it happens. Remove soap buildup in the tub or shower regularly. Attach bath mats securely with double-sided non-slip rug tape. Do not have throw rugs and other things on the floor that can make you trip. What can I do in the bedroom? Use night lights. Make sure that you have a light by your bed that is easy to reach. Do not use any sheets or blankets that are too big for your bed. They should not hang down onto the floor. Have a firm chair that has side arms. You can use this for support while you get dressed. Do not have throw rugs and other things on the floor that can make you trip. What can I do in the kitchen? Clean up any spills right away. Avoid walking on wet floors. Keep items that you use a lot in easy-to-reach places. If you need to reach something above you, use a strong step stool that has a grab bar. Keep electrical cords out of the way. Do not use floor polish or wax that makes floors slippery. If you must use wax, use non-skid  floor wax. Do not have throw rugs and other things on the floor that can make you trip. What can I do with my stairs? Do not leave any items on the stairs. Make sure that there are handrails on both sides of the stairs and use them. Fix handrails that are broken or loose. Make sure that handrails are as long as the stairways. Check any carpeting to make sure that it is firmly attached to the stairs. Fix any carpet that is loose or worn. Avoid having throw rugs at the top or bottom of the stairs. If you do have throw rugs, attach them to the floor with carpet tape. Make sure that you have a light switch at the top of the stairs and the bottom of the stairs. If  you do not have them, ask someone to add them for you. What else can I do to help prevent falls? Wear shoes that: Do not have high heels. Have rubber bottoms. Are comfortable and fit you well. Are closed at the toe. Do not wear sandals. If you use a stepladder: Make sure that it is fully opened. Do not climb a closed stepladder. Make sure that both sides of the stepladder are locked into place. Ask someone to hold it for you, if possible. Clearly mark and make sure that you can see: Any grab bars or handrails. First and last steps. Where the edge of each step is. Use tools that help you move around (mobility aids) if they are needed. These include: Canes. Walkers. Scooters. Crutches. Turn on the lights when you go into a dark area. Replace any light bulbs as soon as they burn out. Set up your furniture so you have a clear path. Avoid moving your furniture around. If any of your floors are uneven, fix them. If there are any pets around you, be aware of where they are. Review your medicines with your doctor. Some medicines can make you feel dizzy. This can increase your chance of falling. Ask your doctor what other things that you can do to help prevent falls. This information is not intended to replace advice given to you by your health care provider. Make sure you discuss any questions you have with your health care provider. Document Released: 05/25/2009 Document Revised: 01/04/2016 Document Reviewed: 09/02/2014 Elsevier Interactive Patient Education  2017 [employer].

## 2022-03-07 NOTE — Progress Notes (Signed)
Subjective:   Wendy Barajas is a 81 y.o. female who presents for Medicare Annual (Subsequent) preventive examination.  Virtual Visit via Telephone Note  I connected with  [name] on 03/07/22 at  3:30 PM EDT by telephone and verified that I am speaking with the correct person using two identifiers.  Location: Patient: Home Provider: [facility] Persons participating in the virtual visit: patient/Nurse Health Advisor   I discussed the limitations, risks, security and privacy concerns of performing an evaluation and management service by telephone and the availability of in person appointments. The patient expressed understanding and agreed to proceed.  Interactive audio and video telecommunications were attempted between this nurse and patient, however failed, due to patient having technical difficulties OR patient did not have access to video capability.  We continued and completed visit with audio only.  Some vital signs may be absent or patient reported.   Lilley Hubble E Wylan Gentzler, LPN   Review of Systems     Cardiac Risk Factors include: advanced age (>23men, >57 women);dyslipidemia;hypertension;sedentary lifestyle;obesity (BMI >30kg/m2)     Objective:    Today's Vitals   03/07/22 1517  Weight: 181 lb (82.1 kg)  PainSc: 4    Body mass index is 32.06 kg/m.     06/18/2021   12:32 PM 03/06/2021    3:50 PM 12/09/2019    3:01 PM 12/08/2018    3:25 PM 10/30/2017   10:44 AM  Advanced Directives  Does Patient Have a Medical Advance Directive? No Yes No No No  Type of Advance Directive  [place] in Chart?  No - copy requested     Would patient like information on creating a medical advance directive?   No - Patient declined No - Patient declined Yes (ED - Information included in AVS)    Current Medications (verified) Outpatient Encounter Medications as of 03/07/2022  Medication Sig   amLODipine (NORVASC) 2.5 MG tablet TAKE 1  TABLET EVERY DAY (DISCONTINUE 5MG  TABLET)   calcium carbonate (OS-CAL - DOSED IN MG OF ELEMENTAL CALCIUM) 1250 (500 Ca) MG tablet Take 1 tablet by mouth.   Capsaicin-Menthol-Methyl Sal (CAPSAICIN-METHYL SAL-MENTHOL) 0.025-1-12 % CREA Apply 1 application topically 4 (four) times daily as needed.   Cholecalciferol (VITAMIN D3) 400 UNITS CAPS Take 1 capsule by mouth daily.    loratadine (CLARITIN) 10 MG tablet TAKE 1 TABLET EVERY DAY   losartan (COZAAR) 50 MG tablet Take 1 tablet (50 mg total) by mouth daily.   nystatin cream (MYCOSTATIN) APPLY 1 APPLICATION TOPICALLY TWO TIMES DAILY TO THE GROIN AREAS X 7-14 DAYS PER FLARE.   RABEprazole (ACIPHEX) 20 MG tablet Take 1 tablet (20 mg total) by mouth daily.   simvastatin (ZOCOR) 20 MG tablet TAKE 1 TABLET EVERY DAY   No facility-administered encounter medications on file as of 03/07/2022.    Allergies (verified) Pollen extract   History: Past Medical History:  Diagnosis Date   Allergy    Arthritis    bone spurs - bilateral knee issues    GERD (gastroesophageal reflux disease)    Hyperlipidemia    Hypertension    Past Surgical History:  Procedure Laterality Date   ABDOMINAL HYSTERECTOMY     COLONOSCOPY     EYE SURGERY Bilateral    cataract removal    Family History  Problem Relation Age of Onset   Cancer Father        unsure = thinks colon  Diabetes Mother    Heart disease Mother    Cancer Brother        unsure    Stroke Brother    Arthritis Sister        back issues    Social History   Socioeconomic History   Marital status: Widowed    Spouse name: Not on file   Number of children: 1   Years of education: Not on file   Highest education level: 11th grade  Occupational History   Occupation: Retired   Tobacco Use   Smoking status: Never   Smokeless tobacco: Never  Vaping Use   Vaping Use: Never used  Substance and Sexual Activity   Alcohol use: No    Alcohol/week: 0.0 standard drinks of alcohol   Drug use: No    Sexual activity: Not Currently  Other Topics Concern   Not on file  Social History Narrative   Live alone; Her son lives nearby   Social Determinants of Health   Financial Resource Strain: Low Risk  (03/07/2022)   Overall Financial Resource Strain (CARDIA)    Difficulty of Paying Living Expenses: Not hard at all  Food Insecurity: No Food Insecurity (03/07/2022)   Hunger Vital Sign    Worried About Running Out of Food in the Last Year: Never true    Ran Out of Food in the Last Year: Never true  Transportation Needs: No Transportation Needs (03/07/2022)   PRAPARE - Hydrologist (Medical): No    Lack of Transportation (Non-Medical): No  Physical Activity: Insufficiently Active (03/07/2022)   Exercise Vital Sign    Days of Exercise per Week: 7 days    Minutes of Exercise per Session: 20 min  Stress: No Stress Concern Present (03/07/2022)   [place]    Feeling of Stress : Not at all  Social Connections: Moderately Integrated (03/07/2022)   Social Connection and Isolation Panel [NHANES]    Frequency of Communication with Friends and Family: More than three times a week    Frequency of Social Gatherings with Friends and Family: Twice a week    Attends Religious Services: More than 4 times per year    Active Member of [employer] or Organizations: Yes    Attends Archivist Meetings: More than 4 times per year    Marital Status: Widowed    Tobacco Counseling Counseling given: Not Answered   Clinical Intake:  Pre-visit preparation completed: Yes  Pain : 0-10 Pain Score: 4  Pain Type: Chronic pain Pain Location: Knee Pain Orientation: Right Pain Descriptors / Indicators: Aching, Sore, Tender Pain Onset: More than a month ago Pain Frequency: Intermittent     BMI - recorded: 32.06 Nutritional Status: BMI > 30  Obese Nutritional Risks: None Diabetes: No  How often do you need  to have someone help you when you read instructions, pamphlets, or other written materials from your doctor or pharmacy?: 1 - Never  Diabetic? no  Interpreter Needed?: No  Information entered by :: Damaria Vachon, LPN   Activities of Daily Living    03/07/2022    3:20 PM  In your present state of health, do you have any difficulty performing the following activities:  Hearing? 0  Vision? 0  Difficulty concentrating or making decisions? 0  Walking or climbing stairs? 0  Dressing or bathing? 0  Doing errands, shopping? 0  Preparing Food and eating ? N  Using the Toilet?  N  In the past six months, have you accidently leaked urine? N  Do you have problems with loss of bowel control? N  Managing your Medications? N  Managing your Finances? N  Housekeeping or managing your Housekeeping? N    Patient Care Team: [name], DO as PCP - General (Family Medicine) Claudia Desanctis, MD as Consulting Physician (Nephrology)  Indicate any recent Medical Services you may have received from other than [facility] providers in the past year (date may be approximate).     Assessment:   This is a routine wellness examination for Anneliese.  Hearing/Vision screen Hearing Screening - Comments:: Denies hearing difficulties   Vision Screening - Comments:: Wears rx glasses - behind with routine eye exams with [place] - probably saw him around 2019  Dietary issues and exercise activities discussed: Current Exercise Habits: Home exercise routine, Type of exercise: walking, Time (Minutes): 20, Frequency (Times/Week): 7, Weekly Exercise (Minutes/Week): 140, Intensity: Mild, Exercise limited by: None identified   Goals Addressed               This Visit's Progress     Increase physical activity (pt-stated)   On track     Increase physical activity by walking 3 times a week for 30 minutes at a time weather permitting       Prevent falls   On track      Depression Screen    03/07/2022     3:21 PM 10/24/2021   10:13 AM 04/26/2021    2:01 PM 03/06/2021    3:41 PM 02/02/2021    4:29 PM 01/19/2021    2:07 PM 01/01/2021   11:03 AM  PHQ 2/9 Scores  PHQ - 2 Score 2 0 0 0 0 0 0  PHQ- 9 Score 3 1 2    2     Fall Risk    03/07/2022    3:19 PM 10/24/2021   10:13 AM 04/26/2021    2:01 PM 03/06/2021    3:49 PM 02/02/2021    4:29 PM  Fall Risk   Falls in the past year? 0 0 0 0 0  Number falls in past yr: 0   0   Injury with Fall? 0   0   Risk for fall due to : No Fall Risks   Orthopedic patient;Impaired balance/gait;Impaired vision   Follow up Falls prevention discussed   Falls prevention discussed;Education provided     FALL RISK PREVENTION PERTAINING TO THE HOME:  Any stairs in or around the home? No  If so, are there any without handrails? No  Home free of loose throw rugs in walkways, pet beds, electrical cords, etc? Yes  Adequate lighting in your home to reduce risk of falls? Yes   ASSISTIVE DEVICES UTILIZED TO PREVENT FALLS:  Life alert? No  Use of a cane, walker or w/c?  Prn cane Grab bars in the bathroom? No  Shower chair or bench in shower? No  Elevated toilet seat or a handicapped toilet? No   TIMED UP AND GO:  Was the test performed? No . Telephonic visit  Cognitive Function:    04/26/2021    2:27 PM 10/30/2017   10:46 AM  MMSE - Mini Mental State Exam  Orientation to time 5 5  Orientation to Place 5 5  Registration 3 3  Attention/ Calculation 5 5  Recall 2 3  Language- name 2 objects 2 2  Language- repeat 1 1  Language- follow 3 step command  3 3  Language- read & follow direction 1 1  Write a sentence 1 1  Copy design 1 1  Total score 29 30        03/07/2022    3:21 PM 12/09/2019    3:03 PM 12/08/2018    3:28 PM  6CIT Screen  What Year? 0 points 0 points 0 points  What month? 0 points 0 points 0 points  What time? 0 points 0 points 0 points  Count back from 20 0 points 0 points 0 points  Months in reverse 2 points 0 points 2 points  Repeat  phrase 0 points 0 points 0 points  Total Score 2 points 0 points 2 points    Immunizations Immunization History  Administered Date(s) Administered   Influenza, High Dose Seasonal PF 06/26/2017   Influenza,inj,Quad PF,6+ Mos 06/20/2015   Tdap 02/12/2019    TDAP status: Up to date  Flu Vaccine status: Declined, Education has been provided regarding the importance of this vaccine but patient still declined. Advised may receive this vaccine at local pharmacy or Health Dept. Aware to provide a copy of the vaccination record if obtained from local pharmacy or Health Dept. Verbalized acceptance and understanding.  Pneumococcal vaccine status: Declined,  Education has been provided regarding the importance of this vaccine but patient still declined. Advised may receive this vaccine at local pharmacy or Health Dept. Aware to provide a copy of the vaccination record if obtained from local pharmacy or Health Dept. Verbalized acceptance and understanding.   Covid-19 vaccine status: Declined, Education has been provided regarding the importance of this vaccine but patient still declined. Advised may receive this vaccine at local pharmacy or Health Dept.or vaccine clinic. Aware to provide a copy of the vaccination record if obtained from local pharmacy or Health Dept. Verbalized acceptance and understanding.  Qualifies for Shingles Vaccine? Yes   Zostavax completed No   Shingrix Completed?: No.    Education has been provided regarding the importance of this vaccine. Patient has been advised to call insurance company to determine out of pocket expense if they have not yet received this vaccine. Advised may also receive vaccine at local pharmacy or Health Dept. Verbalized acceptance and understanding.  Screening Tests Health Maintenance  Topic Date Due   COVID-19 Vaccine (1) 04/26/2022 (Originally 01/01/1942)   Pneumonia Vaccine 20+ Years old (1 - PCV) 10/25/2022 (Originally 07/04/2005)   Zoster  Vaccines- Shingrix (1 of 2) 01/25/2023 (Originally 07/04/1990)   INFLUENZA VACCINE  03/12/2022   TETANUS/TDAP  02/11/2029   DEXA SCAN  Completed   HPV VACCINES  Aged Out    Health Maintenance  There are no preventive care reminders to display for this patient.  Colorectal cancer screening: No longer required.   Mammogram status: No longer required due to age and she declines.  Bone Density status: Completed 10/30/2017. Results reflect: Bone density results: OSTEOPENIA. Repeat every 2 years. Patient declines  Lung Cancer Screening: (Low Dose CT Chest recommended if Age 28-80 years, 30 pack-year currently smoking OR have quit w/in 15years.) does not qualify.   Additional Screening:  Hepatitis C Screening: does not qualify  Vision Screening: Recommended annual ophthalmology exams for early detection of glaucoma and other disorders of the eye. Is the patient up to date with their annual eye exam?  No  Who is the provider or what is the name of the office in which the patient attends annual eye exams? [facility] If pt is not established with a provider, would  they like to be referred to a provider to establish care? No .   Dental Screening: Recommended annual dental exams for proper oral hygiene  Community Resource Referral / Chronic Care Management: CRR required this visit?  No   CCM required this visit?  No      Plan:     I have personally reviewed and noted the following in the patient's chart:   Medical and social history Use of alcohol, tobacco or illicit drugs  Current medications and supplements including opioid prescriptions.  Functional ability and status Nutritional status Physical activity Advanced directives List of other physicians Hospitalizations, surgeries, and ER visits in previous 12 months Vitals Screenings to include cognitive, depression, and falls Referrals and appointments  In addition, I have reviewed and discussed with patient certain  preventive protocols, quality metrics, and best practice recommendations. A written personalized care plan for preventive services as well as general preventive health recommendations were provided to patient.     Sandrea Hammond, LPN   04/05/1897   Nurse Notes: None

## 2022-04-29 ENCOUNTER — Encounter: Payer: Medicare HMO | Admitting: Family Medicine

## 2022-06-04 ENCOUNTER — Encounter: Payer: Self-pay | Admitting: Family Medicine

## 2022-06-04 ENCOUNTER — Ambulatory Visit (INDEPENDENT_AMBULATORY_CARE_PROVIDER_SITE_OTHER): Payer: Medicare HMO | Admitting: Family Medicine

## 2022-06-04 VITALS — BP 145/80 | HR 79 | Temp 98.3°F | Ht 63.0 in | Wt 178.0 lb

## 2022-06-04 DIAGNOSIS — I10 Essential (primary) hypertension: Secondary | ICD-10-CM | POA: Diagnosis not present

## 2022-06-04 DIAGNOSIS — L853 Xerosis cutis: Secondary | ICD-10-CM

## 2022-06-04 DIAGNOSIS — E7800 Pure hypercholesterolemia, unspecified: Secondary | ICD-10-CM | POA: Diagnosis not present

## 2022-06-04 DIAGNOSIS — N1832 Chronic kidney disease, stage 3b: Secondary | ICD-10-CM | POA: Diagnosis not present

## 2022-06-04 DIAGNOSIS — Z0001 Encounter for general adult medical examination with abnormal findings: Secondary | ICD-10-CM

## 2022-06-04 DIAGNOSIS — Z0000 Encounter for general adult medical examination without abnormal findings: Secondary | ICD-10-CM

## 2022-06-04 DIAGNOSIS — K219 Gastro-esophageal reflux disease without esophagitis: Secondary | ICD-10-CM | POA: Diagnosis not present

## 2022-06-04 MED ORDER — AMLODIPINE BESYLATE 2.5 MG PO TABS: ORAL_TABLET | ORAL | 3 refills | Status: DC

## 2022-06-04 MED ORDER — RABEPRAZOLE SODIUM 20 MG PO TBEC: 20.0000 mg | DELAYED_RELEASE_TABLET | Freq: Every day | ORAL | 3 refills | Status: DC

## 2022-06-04 MED ORDER — SIMVASTATIN 20 MG PO TABS: 20.0000 mg | ORAL_TABLET | Freq: Every day | ORAL | 3 refills | Status: DC

## 2022-06-04 MED ORDER — LOSARTAN POTASSIUM 50 MG PO TABS: 50.0000 mg | ORAL_TABLET | Freq: Every day | ORAL | 3 refills | Status: DC

## 2022-06-04 NOTE — Patient Instructions (Signed)
Basic Skin Care Your skin plays an important role in keeping the entire body healthy.  Below are some tips on how to try and maximize skin health from the outside in.  Bathe in mildly warm water every 1 to 3 days, followed by light drying and an application of a thick moisturizer cream or ointment, preferably one that comes in a tub. Fragrance free moisturizing bars or body washes are preferred such as Purpose, Cetaphil, Dove sensitive skin, Aveeno, California Baby or Vanicream products. Use a fragrance free cream or ointment, not a lotion, such as plain petroleum jelly or Vaseline ointment, Aquaphor, Vanicream, Eucerin cream or a generic version, CeraVe Cream, Cetaphil Restoraderm, Aveeno Eczema Therapy and [place] Baby Calming, among others. Children with very dry skin often need to put on these creams two, three or four times a day.  As much as possible, use these creams enough to keep the skin from looking dry. Consider using fragrance free/dye free detergent, such as Arm and Hammer for sensitive skin, Tide Free or All Free.   If I am prescribing a medication to go on the skin, the medicine goes on first to the areas that need it, followed by a thick cream as above to the entire body.  Sun is a major cause of damage to the skin. I recommend sun protection for all of my patients. I prefer physical barriers such as hats with wide brims that cover the ears, long sleeve clothing with SPF protection including rash guards for swimming. These can be found seasonally at outdoor clothing companies, [employer] and [employer] and online at www.coolibar.com, www.uvskinz.com and www.sunprecautions.com. Avoid peak sun between the hours of 10am to 3pm to minimize sun exposure.  I recommend sunscreen for all of my patients older than 6 months of age when in the sun, preferably with broad spectrum coverage and SPF 30 or higher.  For children, I recommend sunscreens that only contain titanium dioxide and/or zinc oxide  in the active ingredients. These do not burn the eyes and appear to be safer than chemical sunscreens. These sunscreens include zinc oxide paste found in the diaper section, Vanicream Broad Spectrum 50+, Aveeno Natural Mineral Protection, Neutrogena Pure and Free Baby, Johnson and Johnson Baby Daily face and body lotion, [place] Baby products, among others. There is no such thing as waterproof sunscreen. All sunscreens should be reapplied after 60-80 minutes of wear.  Spray on sunscreens often use chemical sunscreens which do protect against the sun. However, these can be difficult to apply correctly, especially if wind is present, and can be more likely to irritate the skin.  Long term effects of chemical sunscreens are also not fully known.     

## 2022-06-04 NOTE — Progress Notes (Signed)
Wendy Barajas is a 80 y.o. female presents to office today for annual physical exam examination.    Concerns today include: None.  She reports that she is doing relatively well.  She resides alone but her son resides very close.  Tries to stay as active as possible and walks occasionally for exercise but admits that going up inclines are much harder for her from a pulmonary standpoint.  She has been on multiple inhalers in the past but has not found those to be efficacious.  She reports a well-balanced diet where she has high-fiber, low meat intake, low sugar intake and absolutely no sodas but she does report that she loves sweet tea.  She denies any urinary or fecal incontinence.  No falls.  Does not monitor blood pressures at home but is compliant with her Norvasc, Cozaar, Zocor and Aciphex.  She has tried coming off of the Aciphex previously but notes that she had breakthrough acid so she resumed it.  She is also compliant with vitamin D and calcium.   Occupation: retired,  Substance use: None Diet: As above, Exercise: As above Last eye exam: Needs Last colonoscopy: N/A Last mammogram: N/A Last pap smear: N/A Refills needed today: All Immunizations needed: Immunization History  Administered Date(s) Administered   Influenza, High Dose Seasonal PF 06/26/2017   Influenza,inj,Quad PF,6+ Mos 06/20/2015   Tdap 02/12/2019     Past Medical History:  Diagnosis Date   Allergy    Arthritis    bone spurs - bilateral knee issues    GERD (gastroesophageal reflux disease)    Hyperlipidemia    Hypertension    Social History   Socioeconomic History   Marital status: Widowed    Spouse name: Not on file   Number of children: 1   Years of education: Not on file   Highest education level: 11th grade  Occupational History   Occupation: Retired   Tobacco Use   Smoking status: Never   Smokeless tobacco: Never  Vaping Use   Vaping Use: Never used  Substance and Sexual Activity   Alcohol  use: No    Alcohol/week: 0.0 standard drinks of alcohol   Drug use: No   Sexual activity: Not Currently  Other Topics Concern   Not on file  Social History Narrative   Live alone; Her son lives nearby   Social Determinants of Health   Financial Resource Strain: Low Risk  (03/07/2022)   Overall Financial Resource Strain (CARDIA)    Difficulty of Paying Living Expenses: Not hard at all  Food Insecurity: No Food Insecurity (03/07/2022)   Hunger Vital Sign    Worried About Running Out of Food in the Last Year: Never true    Ran Out of Food in the Last Year: Never true  Transportation Needs: No Transportation Needs (03/07/2022)   PRAPARE - Hydrologist (Medical): No    Lack of Transportation (Non-Medical): No  Physical Activity: Insufficiently Active (03/07/2022)   Exercise Vital Sign    Days of Exercise per Week: 7 days    Minutes of Exercise per Session: 20 min  Stress: No Stress Concern Present (03/07/2022)   [place]    Feeling of Stress : Not at all  Social Connections: Moderately Integrated (03/07/2022)   Social Connection and Isolation Panel [NHANES]    Frequency of Communication with Friends and Family: More than three times a week    Frequency of Social Gatherings  with Friends and Family: Twice a week    Attends Religious Services: More than 4 times per year    Active Member of Clubs or Organizations: Yes    Attends Archivist Meetings: More than 4 times per year    Marital Status: Widowed  Intimate Partner Violence: Not At Risk (03/07/2022)   Humiliation, Afraid, Rape, and Kick questionnaire    Fear of Current or Ex-Partner: No    Emotionally Abused: No    Physically Abused: No    Sexually Abused: No   Past Surgical History:  Procedure Laterality Date   ABDOMINAL HYSTERECTOMY     COLONOSCOPY     EYE SURGERY Bilateral    cataract removal    Family History  Problem  Relation Age of Onset   Cancer Father        unsure = thinks colon    Diabetes Mother    Heart disease Mother    Cancer Brother        unsure    Stroke Brother    Arthritis Sister        back issues     Current Outpatient Medications:    amLODipine (NORVASC) 2.5 MG tablet, TAKE 1 TABLET EVERY DAY (DISCONTINUE 5MG  TABLET), Disp: 90 tablet, Rfl: 3   calcium carbonate (OS-CAL - DOSED IN MG OF ELEMENTAL CALCIUM) 1250 (500 Ca) MG tablet, Take 1 tablet by mouth., Disp: , Rfl:    Capsaicin-Menthol-Methyl Sal (CAPSAICIN-METHYL SAL-MENTHOL) 0.025-1-12 % CREA, Apply 1 application topically 4 (four) times daily as needed., Disp: 56.6 g, Rfl: 3   Cholecalciferol (VITAMIN D3) 400 UNITS CAPS, Take 1 capsule by mouth daily. , Disp: , Rfl:    loratadine (CLARITIN) 10 MG tablet, TAKE 1 TABLET EVERY DAY, Disp: 90 tablet, Rfl: 1   losartan (COZAAR) 50 MG tablet, Take 1 tablet (50 mg total) by mouth daily., Disp: 90 tablet, Rfl: 3   nystatin cream (MYCOSTATIN), APPLY 1 APPLICATION TOPICALLY TWO TIMES DAILY TO THE GROIN AREAS X 7-14 DAYS PER FLARE., Disp: 30 g, Rfl: 0   RABEprazole (ACIPHEX) 20 MG tablet, Take 1 tablet (20 mg total) by mouth daily., Disp: 90 tablet, Rfl: 3   simvastatin (ZOCOR) 20 MG tablet, TAKE 1 TABLET EVERY DAY, Disp: 90 tablet, Rfl: 0  Allergies  Allergen Reactions   Pollen Extract      ROS: Review of Systems Pertinent items noted in HPI and remainder of comprehensive ROS otherwise negative.    Physical exam BP (!) 145/80   Pulse 79   Temp 98.3 F (36.8 C)   Ht 5\' 3"  (1.6 m)   Wt 178 lb (80.7 kg)   SpO2 98%   BMI 31.53 kg/m  General appearance: alert, cooperative, appears stated age, and no distress Head: Normocephalic, without obvious abnormality, atraumatic Eyes: negative findings: lids and lashes normal, conjunctivae and sclerae normal, corneas clear, and pupils equal, round, reactive to light and accomodation Ears: normal TM's and external ear canals both ears Nose:  Nares normal. Septum midline. Mucosa normal. No drainage or sinus tenderness. Throat: Moist mucous membranes.  Oropharynx without masses Neck: no adenopathy, no carotid bruit, supple, symmetrical, trachea midline, and thyroid not enlarged, symmetric, no tenderness/mass/nodules Back: symmetric, no curvature. ROM normal. No CVA tenderness. Lungs: clear to auscultation bilaterally Heart: regular rate and rhythm, S1, S2 normal, no murmur, click, rub or gallop Abdomen: soft, non-tender; bowel sounds normal; no masses,  no organomegaly Extremities:  Mild tenderness to the anterior shins.  She has extremely  dry skin but no skin breakdown, erythema or warmth.  No skin lesions appreciated Pulses: 2+ and symmetric Skin:  Dry Lymph nodes: Cervical, supraclavicular, and axillary nodes normal. Neurologic: Grossly normal Psych: Very pleasant, interactive female no acute distress  Flowsheet Row Office Visit from 06/04/2022 in [place]  PHQ-2 Total Score 0       Assessment/ Plan: [name] here for annual physical exam.   Annual physical exam  Chronic kidney disease, stage 3b ([place]) - Plan: CANCELED: Renal Function Panel  Essential hypertension - Plan: amLODipine (NORVASC) 2.5 MG tablet, losartan (COZAAR) 50 MG tablet  Pure hypercholesterolemia - Plan: TSH, simvastatin (ZOCOR) 20 MG tablet  Gastroesophageal reflux disease without esophagitis - Plan: RABEprazole (ACIPHEX) 20 MG tablet  Dry skin - Plan: TSH  Declines vaccines today.  My plan was to check renal function but she gets this done with [facility].  This was therefore canceled.  She will continue current blood pressure regimen with Norvasc and Cozaar.  Check TSH.  Continue Zocor.  Aciphex renewed for GERD.  She is trialed off of this PPI before but could not tolerate  Dry skin information given to the patient today.  Counseled on healthy lifestyle choices, including diet (rich in  fruits, vegetables and lean meats and low in salt and simple carbohydrates) and exercise (at least 30 minutes of moderate physical activity daily).  Patient to follow up in 42m for fasting labs  Yash Cacciola M. Lajuana Ripple, DO

## 2022-06-05 LAB — TSH: TSH: 1.79 u[IU]/mL (ref 0.450–4.500)

## 2022-06-13 DIAGNOSIS — N1832 Chronic kidney disease, stage 3b: Secondary | ICD-10-CM | POA: Diagnosis not present

## 2022-06-13 DIAGNOSIS — I129 Hypertensive chronic kidney disease with stage 1 through stage 4 chronic kidney disease, or unspecified chronic kidney disease: Secondary | ICD-10-CM | POA: Diagnosis not present

## 2022-06-13 DIAGNOSIS — E872 Acidosis, unspecified: Secondary | ICD-10-CM | POA: Diagnosis not present

## 2022-06-13 DIAGNOSIS — G8929 Other chronic pain: Secondary | ICD-10-CM | POA: Diagnosis not present

## 2022-06-13 DIAGNOSIS — M25562 Pain in left knee: Secondary | ICD-10-CM | POA: Diagnosis not present

## 2022-06-13 DIAGNOSIS — Z87442 Personal history of urinary calculi: Secondary | ICD-10-CM | POA: Diagnosis not present

## 2022-06-13 DIAGNOSIS — K219 Gastro-esophageal reflux disease without esophagitis: Secondary | ICD-10-CM | POA: Diagnosis not present

## 2022-07-11 ENCOUNTER — Encounter: Payer: Self-pay | Admitting: *Deleted

## 2022-07-16 IMAGING — DX DG KNEE 1-2V*R*
3 series · 3 of 3 positions shown · non-contrast
Comparison: None.

CLINICAL DATA: Pain

EXAM:
RIGHT KNEE - 1-2 VIEW

[knee ap]
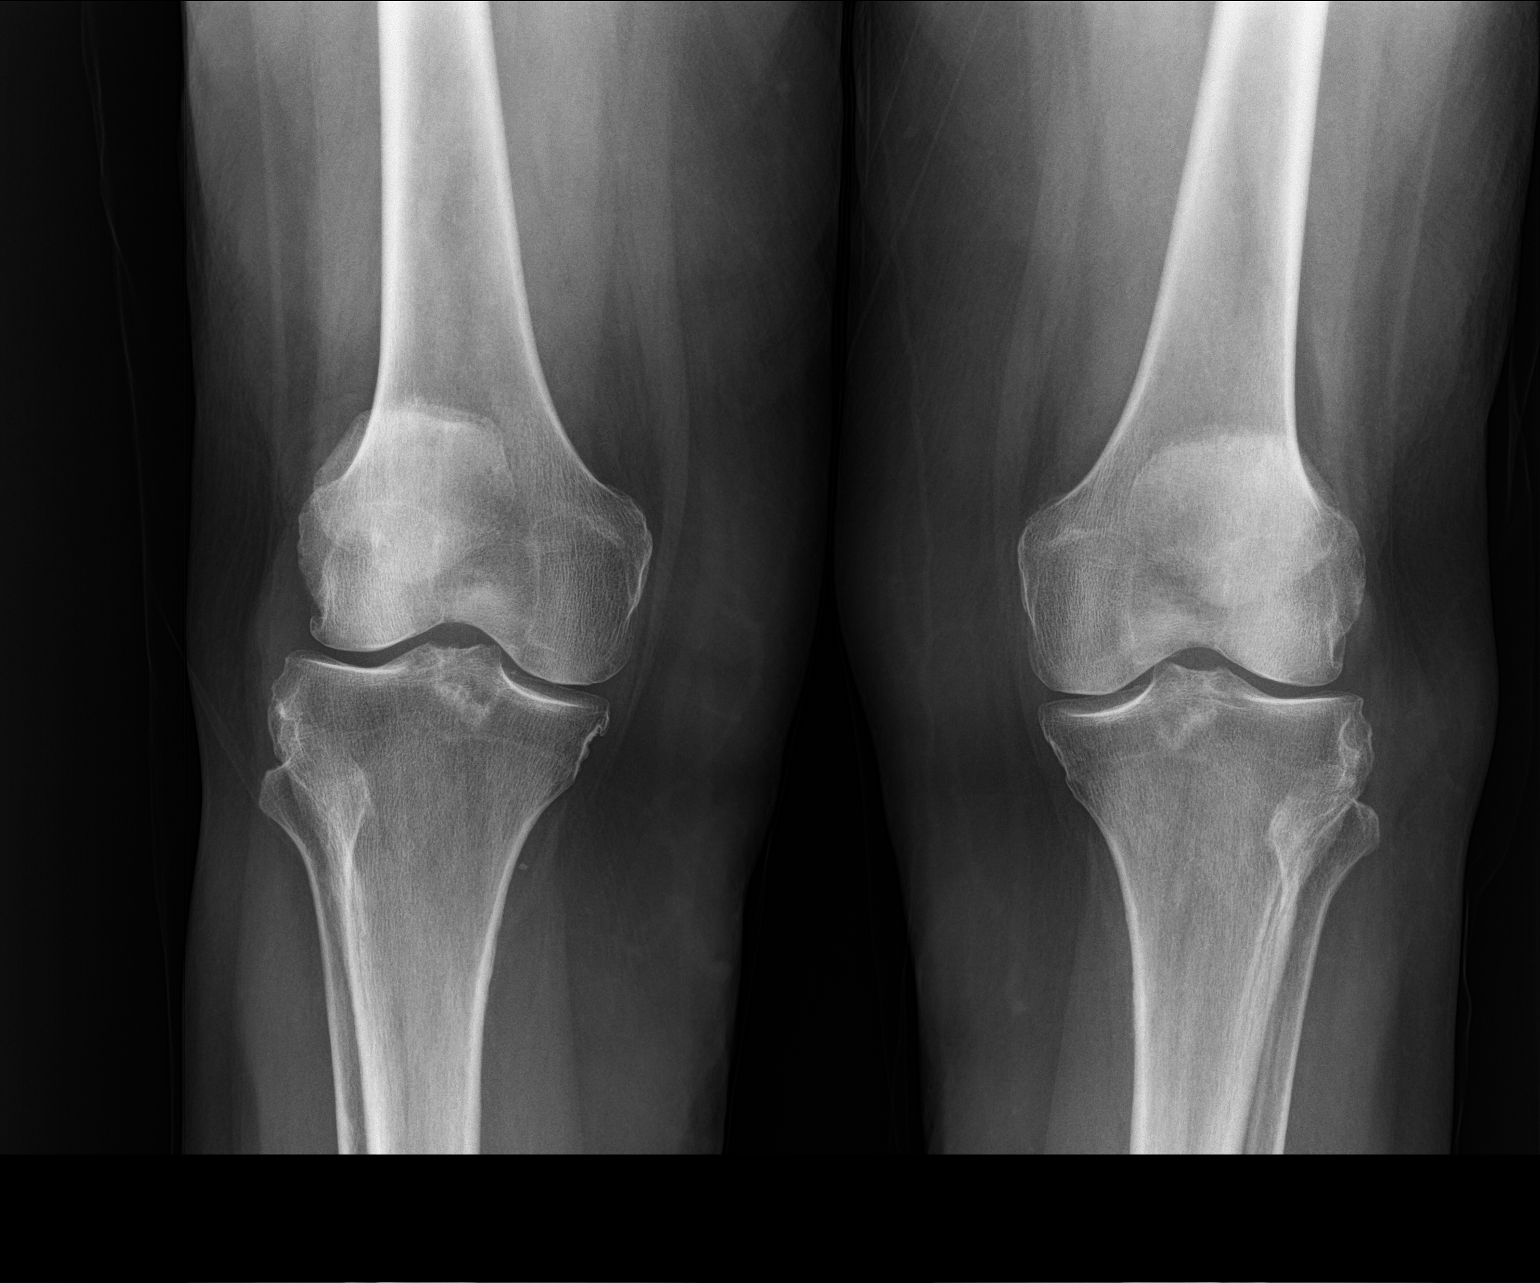

[knee lat (1 of 2)]
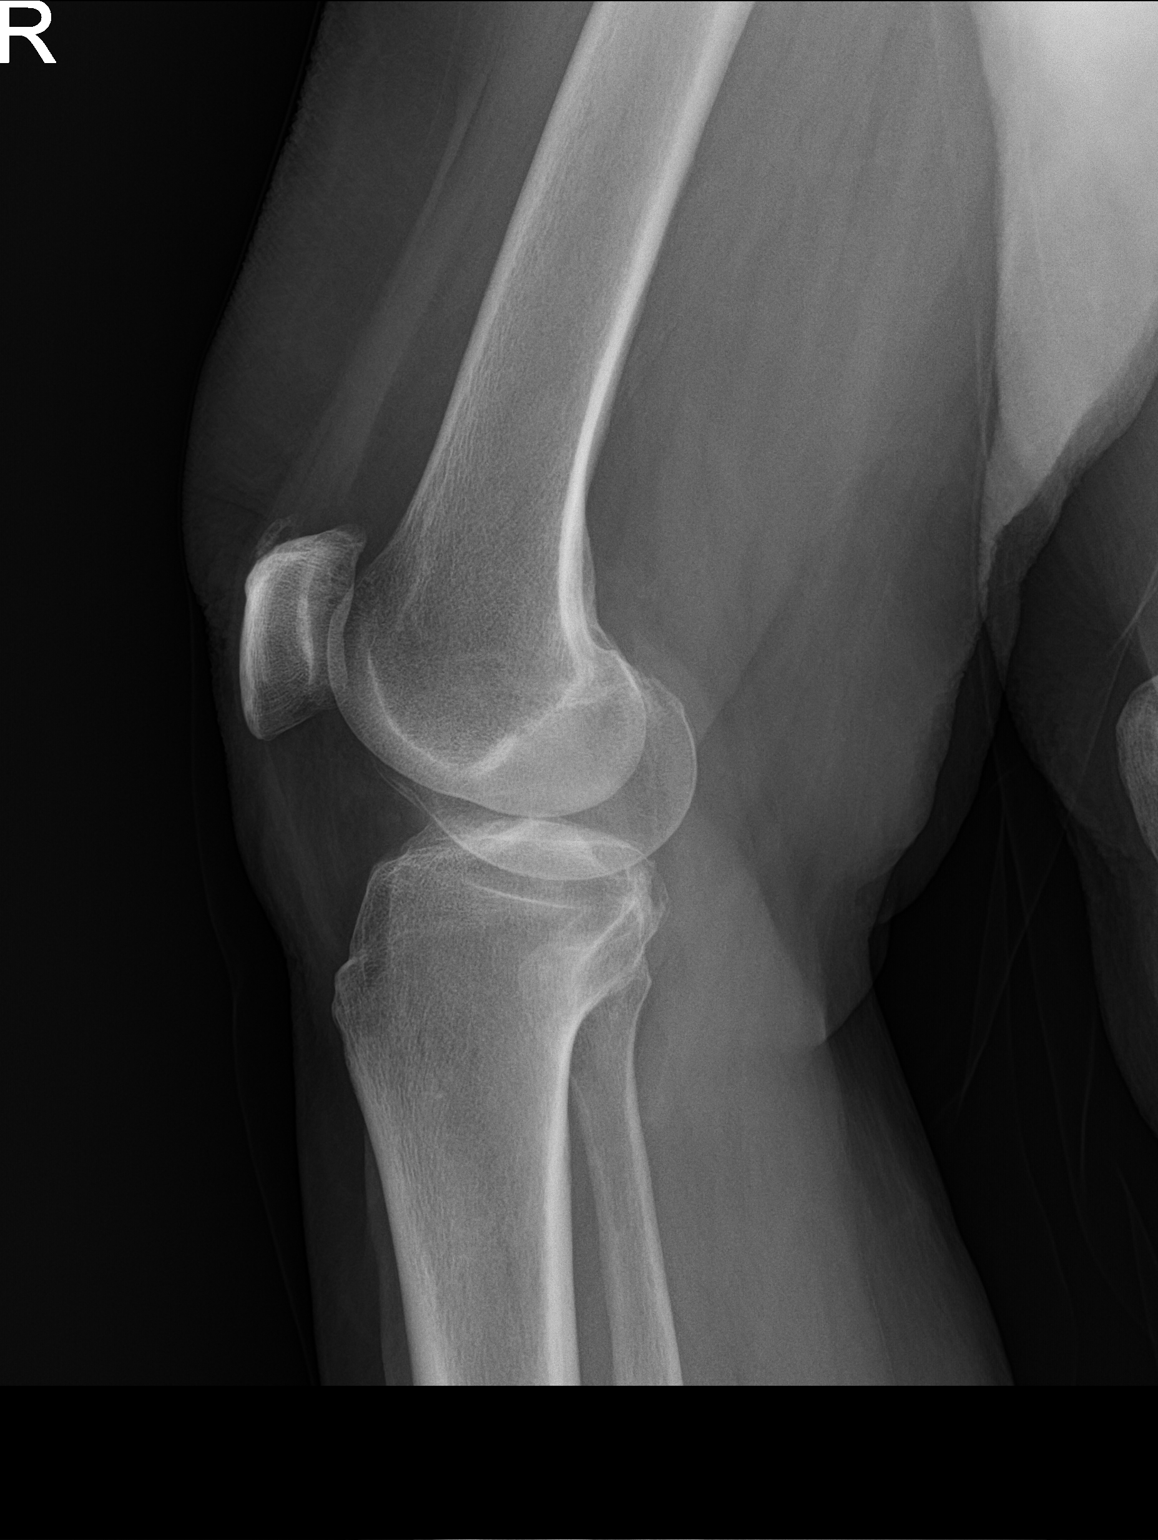

[knee lat (2 of 2)]
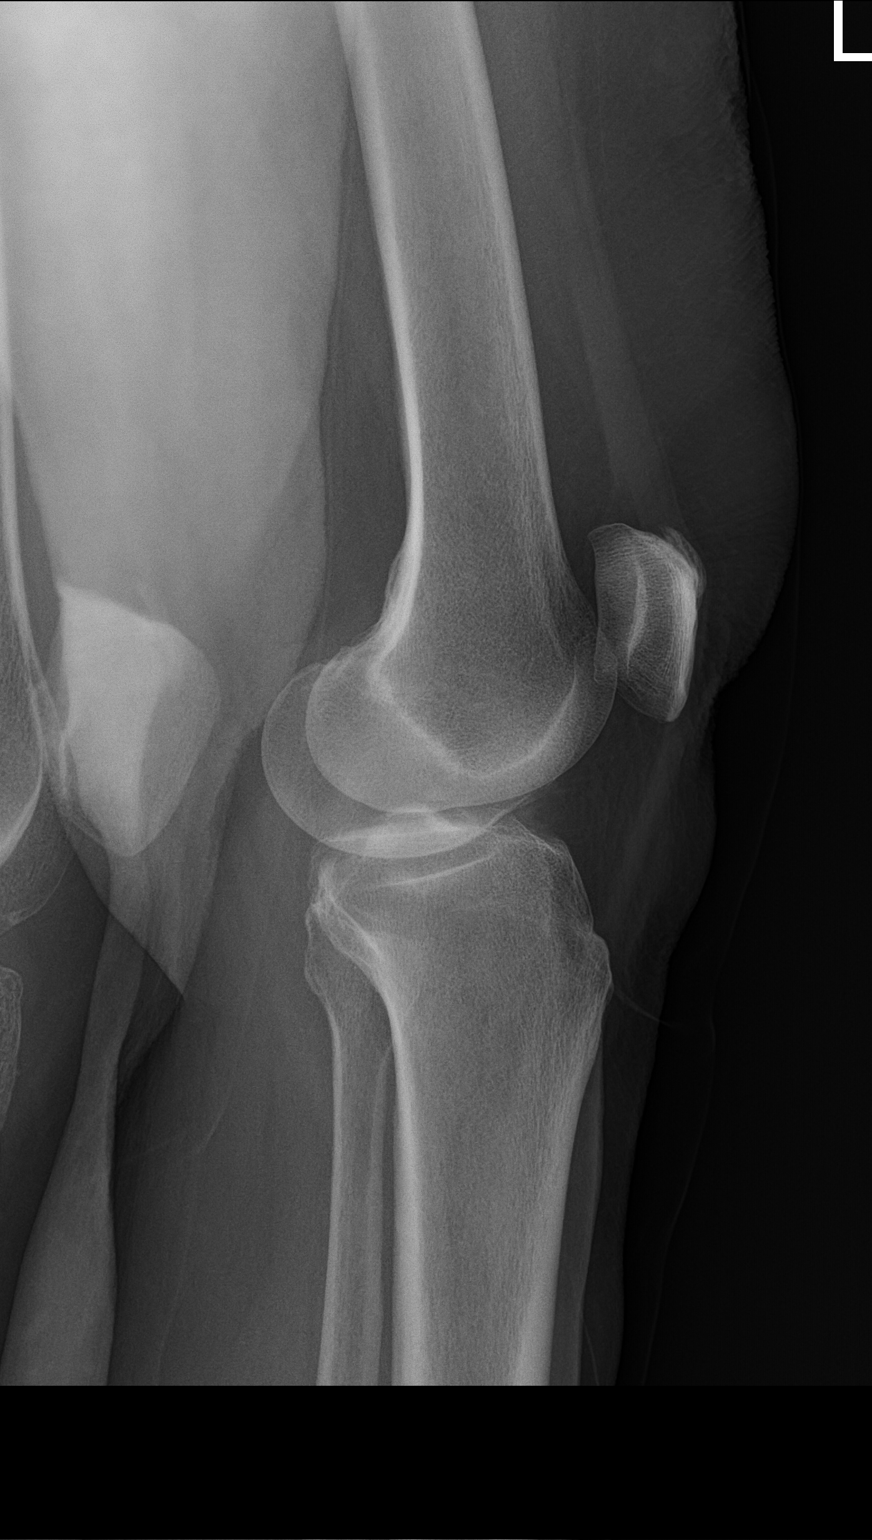

[3 of 3 positions shown; findings below may reference images not displayed]

FINDINGS: Right knee:

Mild tricompartmental degenerative changes. Enthesopathic changes
off the superior patella. No fracture or effusion.

Left knee:

Enthesopathic changes off the superior left patella. No other
abnormalities.
IMPRESSION: Tricompartmental degenerative changes in the right knee.
Enthesopathic changes off the superior patella is bilaterally.

## 2022-07-22 ENCOUNTER — Other Ambulatory Visit: Payer: Self-pay | Admitting: Family Medicine

## 2022-07-30 ENCOUNTER — Ambulatory Visit (INDEPENDENT_AMBULATORY_CARE_PROVIDER_SITE_OTHER): Payer: Medicare HMO | Admitting: Nurse Practitioner

## 2022-07-30 ENCOUNTER — Encounter: Payer: Self-pay | Admitting: Nurse Practitioner

## 2022-07-30 DIAGNOSIS — R051 Acute cough: Secondary | ICD-10-CM

## 2022-07-30 DIAGNOSIS — J988 Other specified respiratory disorders: Secondary | ICD-10-CM

## 2022-07-30 MED ORDER — FLUTICASONE PROPIONATE 50 MCG/ACT NA SUSP: 2.0000 | Freq: Every day | NASAL | 6 refills | Status: DC

## 2022-07-30 MED ORDER — PREDNISONE 20 MG PO TABS: 40.0000 mg | ORAL_TABLET | Freq: Every day | ORAL | 0 refills | Status: AC

## 2022-07-30 NOTE — Patient Instructions (Signed)

## 2022-07-30 NOTE — Progress Notes (Signed)
Virtual Visit  Note Due to COVID-19 pandemic this visit was conducted virtually. This visit type was conducted due to national recommendations for restrictions regarding the COVID-19 Pandemic (e.g. social distancing, sheltering in place) in an effort to limit this patient's exposure and mitigate transmission in our community. All issues noted in this document were discussed and addressed.  A physical exam was not performed with this format.  I connected with Wendy Barajas on 07/30/22 at 9:28 by telephone and verified that I am speaking with the correct person using two identifiers. Wendy Barajas is currently located at home and no ne is currently with her during visit. The provider, Mary-Margaret Hassell Done, FNP is located in their office at time of visit.  I discussed the limitations, risks, security and privacy concerns of performing an evaluation and management service by telephone and the availability of in person appointments. I also discussed with the patient that there may be a patient responsible charge related to this service. The patient expressed understanding and agreed to proceed.   History and Present Illness:  URI  This is a new problem. The current episode started 1 to 4 weeks ago. The problem has been gradually improving. There has been no fever. Associated symptoms include congestion, coughing and rhinorrhea. Pertinent negatives include no sore throat. She has tried acetaminophen and decongestant for the symptoms. The treatment provided mild relief.      Review of Systems  HENT:  Positive for congestion and rhinorrhea. Negative for sore throat.   Respiratory:  Positive for cough.      Observations/Objective: Alert and oriented- answers all questions appropriately No distress Raspy voice Cough noted  Assessment and Plan: Wendy Barajas in today with chief complaint of URI   1. Acute cough 2. Congestion of upper airway 1. Take meds as prescribed 2. Use a cool mist  humidifier especially during the winter months and when heat has been humid. 3. Use saline nose sprays frequently 4. Saline irrigations of the nose can be very helpful if done frequently.  * 4X daily for 1 week*  * Use of a nettie pot can be helpful with this. Follow directions with this* 5. Drink plenty of fluids 6. Keep thermostat turn down low 7.For any cough or congestion- mucinex 8. For fever or aces or pains- take tylenol or ibuprofen appropriate for age and weight.  * for fevers greater than 101 orally you may alternate ibuprofen and tylenol every  3 hours.   Meds ordered this encounter  Medications   fluticasone (FLONASE) 50 MCG/ACT nasal spray    Sig: Place 2 sprays into both nostrils daily.    Dispense:  16 g    Refill:  6    Order Specific Question:   Supervising Provider    Answer:   Caryl Pina A [[id number]]   predniSONE (DELTASONE) 20 MG tablet    Sig: Take 2 tablets (40 mg total) by mouth daily with breakfast for 5 days. 2 po daily for 5 days    Dispense:  10 tablet    Refill:  0    Order Specific Question:   Supervising Provider    Answer:   Caryl Pina A [[id number]]        Follow Up Instructions: prn    I discussed the assessment and treatment plan with the patient. The patient was provided an opportunity to ask questions and all were answered. The patient agreed with the plan and demonstrated an understanding of the instructions.   The patient  was advised to call back or seek an in-person evaluation if the symptoms worsen or if the condition fails to improve as anticipated.  The above assessment and management plan was discussed with the patient. The patient verbalized understanding of and has agreed to the management plan. Patient is aware to call the clinic if symptoms persist or worsen. Patient is aware when to return to the clinic for a follow-up visit. Patient educated on when it is appropriate to go to the emergency department.   Time call  ended:  9:40  I provided 12 minutes of  non face-to-face time during this encounter.    Mary-Margaret Hassell Done, FNP

## 2022-11-06 ENCOUNTER — Other Ambulatory Visit: Payer: Self-pay | Admitting: Family Medicine

## 2022-11-06 DIAGNOSIS — I10 Essential (primary) hypertension: Secondary | ICD-10-CM

## 2022-12-04 ENCOUNTER — Ambulatory Visit: Payer: Medicare HMO | Admitting: Family Medicine

## 2022-12-11 ENCOUNTER — Encounter: Payer: Self-pay | Admitting: Family Medicine

## 2022-12-11 ENCOUNTER — Ambulatory Visit (INDEPENDENT_AMBULATORY_CARE_PROVIDER_SITE_OTHER): Payer: Medicare HMO | Admitting: Family Medicine

## 2022-12-11 VITALS — BP 146/83 | HR 78 | Temp 98.6°F | Ht 63.0 in | Wt 178.0 lb

## 2022-12-11 DIAGNOSIS — N1832 Chronic kidney disease, stage 3b: Secondary | ICD-10-CM

## 2022-12-11 DIAGNOSIS — I10 Essential (primary) hypertension: Secondary | ICD-10-CM

## 2022-12-11 DIAGNOSIS — Z91038 Other insect allergy status: Secondary | ICD-10-CM | POA: Diagnosis not present

## 2022-12-11 DIAGNOSIS — E7800 Pure hypercholesterolemia, unspecified: Secondary | ICD-10-CM | POA: Diagnosis not present

## 2022-12-11 DIAGNOSIS — G894 Chronic pain syndrome: Secondary | ICD-10-CM

## 2022-12-11 MED ORDER — TRIAMCINOLONE ACETONIDE 0.1 % EX CREA: 1.0000 | TOPICAL_CREAM | Freq: Two times a day (BID) | CUTANEOUS | 0 refills | Status: DC | PRN

## 2022-12-11 MED ORDER — DULOXETINE HCL 30 MG PO CPEP: 30.0000 mg | ORAL_CAPSULE | Freq: Every day | ORAL | 0 refills | Status: DC

## 2022-12-11 NOTE — Progress Notes (Signed)
Subjective: CC: 63-month follow-up PCP: Raliegh Ip, DO UEA:[id number] Houchin is a 82 y.o. female presenting to clinic today for:  1.  Hypertension associate with CKD 3B and hyperlipidemia/leg pain Patient is compliant with losartan, Zocor.  She continues to take half tablet of amlodipine and seems to be doing well on this.  No reports of chest pain, shortness of breath.  She continues to follow-up with [facility] for ongoing monitoring of CKD 3B.  She notes that she is only seeing them once per year in [place] now and has been compliant with all medications as directed.  She does have some lower extremity swelling sometimes and tightness in the legs.  She continues to have bilateral lower extremity pain.  This has been predominantly in the knees but now seems to affect the lower leg and ankles as well.  No warmth or erythema noted.  She does not restrict salt but does not necessarily add salt to her diet.  She admits to eating a lot of foods that contain salt in them as that is how they are preserved.  Her lower extremity pain is refractory to Tylenol and topical analgesics  She also mentions that she has been getting some intermittent insect bites that caused some irritation and erythema.   ROS: Per HPI  Allergies  Allergen Reactions   Pollen Extract    Past Medical History:  Diagnosis Date   Allergy    Arthritis    bone spurs - bilateral knee issues    GERD (gastroesophageal reflux disease)    Hyperlipidemia    Hypertension     Current Outpatient Medications:    amLODipine (NORVASC) 2.5 MG tablet, TAKE 1 TABLET EVERY DAY (DISCONTINUE 5MG  TABLET), Disp: 90 tablet, Rfl: 0   calcium carbonate (OS-CAL - DOSED IN MG OF ELEMENTAL CALCIUM) 1250 (500 Ca) MG tablet, Take 1 tablet by mouth., Disp: , Rfl:    Capsaicin-Menthol-Methyl Sal (CAPSAICIN-METHYL SAL-MENTHOL) 0.025-1-12 % CREA, Apply 1 application topically 4 (four) times daily as needed., Disp: 56.6 g, Rfl:  3   Cholecalciferol (VITAMIN D3) 400 UNITS CAPS, Take 1 capsule by mouth daily. , Disp: , Rfl:    fluticasone (FLONASE) 50 MCG/ACT nasal spray, Place 2 sprays into both nostrils daily., Disp: 16 g, Rfl: 6   loratadine (CLARITIN) 10 MG tablet, TAKE 1 TABLET EVERY DAY, Disp: 90 tablet, Rfl: 1   losartan (COZAAR) 50 MG tablet, Take 1 tablet (50 mg total) by mouth daily., Disp: 90 tablet, Rfl: 3   nystatin cream (MYCOSTATIN), APPLY 1 APPLICATION TOPICALLY TWO TIMES DAILY TO THE GROIN AREAS X 7-14 DAYS PER FLARE., Disp: 30 g, Rfl: 0   RABEprazole (ACIPHEX) 20 MG tablet, Take 1 tablet (20 mg total) by mouth daily., Disp: 90 tablet, Rfl: 3   simvastatin (ZOCOR) 20 MG tablet, Take 1 tablet (20 mg total) by mouth daily., Disp: 90 tablet, Rfl: 3 Social History   Socioeconomic History   Marital status: Widowed    Spouse name: Not on file   Number of children: 1   Years of education: Not on file   Highest education level: 11th grade  Occupational History   Occupation: Retired   Tobacco Use   Smoking status: Never   Smokeless tobacco: Never  Vaping Use   Vaping Use: Never used  Substance and Sexual Activity   Alcohol use: No    Alcohol/week: 0.0 standard drinks of alcohol   Drug use: No   Sexual activity: Not Currently  Other  Topics Concern   Not on file  Social History Narrative   Live alone; Her son lives nearby   Social Determinants of Health   Financial Resource Strain: Low Risk  (03/07/2022)   Overall Financial Resource Strain (CARDIA)    Difficulty of Paying Living Expenses: Not hard at all  Food Insecurity: No Food Insecurity (03/07/2022)   Hunger Vital Sign    Worried About Running Out of Food in the Last Year: Never true    Ran Out of Food in the Last Year: Never true  Transportation Needs: No Transportation Needs (03/07/2022)   PRAPARE - Administrator, Civil Service (Medical): No    Lack of Transportation (Non-Medical): No  Physical Activity: Insufficiently Active  (03/07/2022)   Exercise Vital Sign    Days of Exercise per Week: 7 days    Minutes of Exercise per Session: 20 min  Stress: No Stress Concern Present (03/07/2022)   [employer] of Occupational Health - Occupational Stress Questionnaire    Feeling of Stress : Not at all  Social Connections: Moderately Integrated (03/07/2022)   Social Connection and Isolation Panel [NHANES]    Frequency of Communication with Friends and Family: More than three times a week    Frequency of Social Gatherings with Friends and Family: Twice a week    Attends Religious Services: More than 4 times per year    Active Member of [employer] or Organizations: Yes    Attends Banker Meetings: More than 4 times per year    Marital Status: Widowed  Intimate Partner Violence: Not At Risk (03/07/2022)   Humiliation, Afraid, Rape, and Kick questionnaire    Fear of Current or Ex-Partner: No    Emotionally Abused: No    Physically Abused: No    Sexually Abused: No   Family History  Problem Relation Age of Onset   Cancer Father        unsure = thinks colon    Diabetes Mother    Heart disease Mother    Cancer Brother        unsure    Stroke Brother    Arthritis Sister        back issues     Objective: Office vital signs reviewed. BP (!) 146/83   Pulse 78   Temp 98.6 F (37 C)   Ht 5\' 3"  (1.6 m)   Wt 178 lb (80.7 kg)   SpO2 95%   BMI 31.53 kg/m   Physical Examination:  General: Awake, alert, well nourished, No acute distress HEENT: sclera white, MMM  Cardio: regular rate and rhythm, S1S2 heard, no murmurs appreciated Pulm: clear to auscultation bilaterally, no wheezes, rhonchi or rales; normal work of breathing on room air Extremities: warm, well perfused, trace pedal edema, no cyanosis or clubbing; +2 pulses bilaterally MSK: Ambulating independently.  No gross joint deformity, soft tissue swelling.  She does have some tenderness to palpation  Assessment/ Plan: 82 y.o. female   Essential  hypertension - Plan: CMP14+EGFR  Chronic kidney disease, stage 3b ([facility]) - Plan: CMP14+EGFR, CBC, VITAMIN D 25 Hydroxy (Vit-D Deficiency, Fractures)  Pure hypercholesterolemia - Plan: CMP14+EGFR, Lipid Panel, TSH  Chronic pain syndrome - Plan: DULoxetine (CYMBALTA) 30 MG capsule  Allergic reaction to insect bite - Plan: triamcinolone cream (KENALOG) 0.1 %  Blood pressure technically controlled for age but I would like it more tightly controlled given renal disease.  We discussed DASH diet today.  Hopefully this will help with lower extremity edema.  I hope that if she can get her blood pressure down enough we can eliminate the amlodipine totally and perhaps that would even improve some of the edema.  Hesitant to add any diuretics given renal disease do not want to cause an renal injury  Check fasting labs today.  She will continue Zocor at current dose  Given her chronic pain we can try Cymbalta in this patient.  I know that dexamethasone has worked well in the past for severe leg pain but hesitate to keep her on any type of chronic steroid given advanced age, renal disease etc.  Do not want to worsen any bone issues  For the allergic reaction that she gets with insect bites I gave her some triamcinolone to have on hand.  Use as needed  No orders of the defined types were placed in this encounter.  No orders of the defined types were placed in this encounter.    Raliegh Ip, DO [facility] [phone]

## 2022-12-11 NOTE — Patient Instructions (Signed)
Start Duloxetine daily for pain.  Sometimes people feel this causes sleepiness so ok to take a night if that works better for you We talked about reducing salt to help swelling and blood pressure.  DASH Eating Plan DASH stands for Dietary Approaches to Stop Hypertension. The DASH eating plan is a healthy eating plan that has been shown to: Reduce high blood pressure (hypertension). Reduce your risk for type 2 diabetes, heart disease, and stroke. Help with weight loss. What are tips for following this plan? Reading food labels Check food labels for the amount of salt (sodium) per serving. Choose foods with less than 5 percent of the Daily Value of sodium. Generally, foods with less than 300 milligrams (mg) of sodium per serving fit into this eating plan. To find whole grains, look for the word "whole" as the first word in the ingredient list. Shopping Buy products labeled as "low-sodium" or "no salt added." Buy fresh foods. Avoid canned foods and pre-made or frozen meals. Cooking Avoid adding salt when cooking. Use salt-free seasonings or herbs instead of table salt or sea salt. Check with your health care provider or pharmacist before using salt substitutes. Do not fry foods. Cook foods using healthy methods such as baking, boiling, grilling, roasting, and broiling instead. Cook with heart-healthy oils, such as olive, canola, avocado, soybean, or sunflower oil. Meal planning  Eat a balanced diet that includes: 4 or more servings of fruits and 4 or more servings of vegetables each day. Try to fill one-half of your plate with fruits and vegetables. 6-8 servings of whole grains each day. Less than 6 oz (170 g) of lean meat, poultry, or fish each day. A 3-oz (85-g) serving of meat is about the same size as a deck of cards. One egg equals 1 oz (28 g). 2-3 servings of low-fat dairy each day. One serving is 1 cup (237 mL). 1 serving of nuts, seeds, or beans 5 times each week. 2-3 servings of  heart-healthy fats. Healthy fats called omega-3 fatty acids are found in foods such as walnuts, flaxseeds, fortified milks, and eggs. These fats are also found in cold-water fish, such as sardines, salmon, and mackerel. Limit how much you eat of: Canned or prepackaged foods. Food that is high in trans fat, such as some fried foods. Food that is high in saturated fat, such as fatty meat. Desserts and other sweets, sugary drinks, and other foods with added sugar. Full-fat dairy products. Do not salt foods before eating. Do not eat more than 4 egg yolks a week. Try to eat at least 2 vegetarian meals a week. Eat more home-cooked food and less restaurant, buffet, and fast food. Lifestyle When eating at a restaurant, ask that your food be prepared with less salt or no salt, if possible. If you drink alcohol: Limit how much you use to: 0-1 drink a day for women who are not pregnant. 0-2 drinks a day for men. Be aware of how much alcohol is in your drink. In the [place] one drink equals one 12 oz bottle of beer (355 mL), one 5 oz glass of wine (148 mL), or one 1 oz glass of hard liquor (44 mL). General information Avoid eating more than 2,300 mg of salt a day. If you have hypertension, you may need to reduce your sodium intake to 1,500 mg a day. Work with your health care provider to maintain a healthy body weight or to lose weight. Ask what an ideal weight is for you. Get  at least 30 minutes of exercise that causes your heart to beat faster (aerobic exercise) most days of the week. Activities may include walking, swimming, or biking. Work with your health care provider or dietitian to adjust your eating plan to your individual calorie needs. What foods should I eat? Fruits All fresh, dried, or frozen fruit. Canned fruit in natural juice (without added sugar). Vegetables Fresh or frozen vegetables (raw, steamed, roasted, or grilled). Low-sodium or reduced-sodium tomato and vegetable juice.  Low-sodium or reduced-sodium tomato sauce and tomato paste. Low-sodium or reduced-sodium canned vegetables. Grains Whole-grain or whole-wheat bread. Whole-grain or whole-wheat pasta. Brown rice. [place]. Bulgur. Whole-grain and low-sodium cereals. Pita bread. Low-fat, low-sodium crackers. Whole-wheat flour tortillas. Meats and other proteins Skinless chicken or [place]. Ground chicken or [place]. Pork with fat trimmed off. Fish and seafood. Egg whites. Dried beans, peas, or lentils. Unsalted nuts, nut butters, and seeds. Unsalted canned beans. Lean cuts of beef with fat trimmed off. Low-sodium, lean precooked or cured meat, such as sausages or meat loaves. Dairy Low-fat (1%) or fat-free (skim) milk. Reduced-fat, low-fat, or fat-free cheeses. Nonfat, low-sodium ricotta or cottage cheese. Low-fat or nonfat yogurt. Low-fat, low-sodium cheese. Fats and oils Soft margarine without trans fats. Vegetable oil. Reduced-fat, low-fat, or light mayonnaise and salad dressings (reduced-sodium). Canola, safflower, olive, avocado, soybean, and sunflower oils. Avocado. Seasonings and condiments Herbs. Spices. Seasoning mixes without salt. Other foods Unsalted popcorn and pretzels. Fat-free sweets. The items listed above may not be a complete list of foods and beverages you can eat. Contact a dietitian for more information. What foods should I avoid? Fruits Canned fruit in a light or heavy syrup. Fried fruit. Fruit in cream or butter sauce. Vegetables Creamed or fried vegetables. Vegetables in a cheese sauce. Regular canned vegetables (not low-sodium or reduced-sodium). Regular canned tomato sauce and paste (not low-sodium or reduced-sodium). Regular tomato and vegetable juice (not low-sodium or reduced-sodium). Angie Fava. Olives. Grains Baked goods made with fat, such as croissants, muffins, or some breads. Dry pasta or rice meal packs. Meats and other proteins Fatty cuts of meat. Ribs. Fried meat. Berniece Salines.  Bologna, salami, and other precooked or cured meats, such as sausages or meat loaves. Fat from the back of a pig (fatback). Bratwurst. Salted nuts and seeds. Canned beans with added salt. Canned or smoked fish. Whole eggs or egg yolks. Chicken or [place] with skin. Dairy Whole or 2% milk, cream, and half-and-half. Whole or full-fat cream cheese. Whole-fat or sweetened yogurt. Full-fat cheese. Nondairy creamers. Whipped toppings. Processed cheese and cheese spreads. Fats and oils Butter. Stick margarine. Lard. Shortening. Ghee. Bacon fat. Tropical oils, such as coconut, palm kernel, or palm oil. Seasonings and condiments Onion salt, garlic salt, seasoned salt, table salt, and sea salt. Worcestershire sauce. Tartar sauce. Barbecue sauce. Teriyaki sauce. Soy sauce, including reduced-sodium. Steak sauce. Canned and packaged gravies. Fish sauce. Oyster sauce. Cocktail sauce. Store-bought horseradish. Ketchup. Mustard. Meat flavorings and tenderizers. Bouillon cubes. Hot sauces. Pre-made or packaged marinades. Pre-made or packaged taco seasonings. Relishes. Regular salad dressings. Other foods Salted popcorn and pretzels. The items listed above may not be a complete list of foods and beverages you should avoid. Contact a dietitian for more information. Where to find more information National Heart, Lung, and Blood Institute: https://wilson-eaton.com/ American Heart Association: www.heart.org Academy of Nutrition and Dietetics: www.eatright.[place]: www.kidney.org Summary The DASH eating plan is a healthy eating plan that has been shown to reduce high blood pressure (hypertension). It may also reduce  your risk for type 2 diabetes, heart disease, and stroke. When on the DASH eating plan, aim to eat more fresh fruits and vegetables, whole grains, lean proteins, low-fat dairy, and heart-healthy fats. With the DASH eating plan, you should limit salt (sodium) intake to 2,300 mg a day. If you have  hypertension, you may need to reduce your sodium intake to 1,500 mg a day. Work with your health care provider or dietitian to adjust your eating plan to your individual calorie needs. This information is not intended to replace advice given to you by your health care provider. Make sure you discuss any questions you have with your health care provider. Document Revised: 07/02/2019 Document Reviewed: 07/02/2019 Elsevier Patient Education  [place].

## 2022-12-12 LAB — CMP14+EGFR
ALT: 8 IU/L (ref 0–32)
AST: 18 IU/L (ref 0–40)
Albumin/Globulin Ratio: 1.5 (ref 1.2–2.2)
Albumin: 4.4 g/dL (ref 3.7–4.7)
Alkaline Phosphatase: 110 IU/L (ref 44–121)
BUN/Creatinine Ratio: 15 (ref 12–28)
BUN: 20 mg/dL (ref 8–27)
Bilirubin Total: 1.1 mg/dL (ref 0.0–1.2)
CO2: 21 mmol/L (ref 20–29)
Calcium: 9.6 mg/dL (ref 8.7–10.3)
Chloride: 105 mmol/L (ref 96–106)
Creatinine, Ser: 1.37 mg/dL — ABNORMAL HIGH (ref 0.57–1.00)
Globulin, Total: 2.9 g/dL (ref 1.5–4.5)
Glucose: 110 mg/dL — ABNORMAL HIGH (ref 70–99)
Potassium: 5 mmol/L (ref 3.5–5.2)
Sodium: 142 mmol/L (ref 134–144)
Total Protein: 7.3 g/dL (ref 6.0–8.5)
eGFR: 39 mL/min/{1.73_m2} — ABNORMAL LOW (ref >59)

## 2022-12-12 LAB — LIPID PANEL
Chol/HDL Ratio: 3.6 ratio (ref 0.0–4.4)
Cholesterol, Total: 189 mg/dL (ref 100–199)
HDL: 52 mg/dL (ref >39)
LDL Chol Calc (NIH): 111 mg/dL — ABNORMAL HIGH (ref 0–99)
Triglycerides: 146 mg/dL (ref 0–149)
VLDL Cholesterol Cal: 26 mg/dL (ref 5–40)

## 2022-12-12 LAB — CBC
Hematocrit: 38.7 % (ref 34.0–46.6)
Hemoglobin: 12.9 g/dL (ref 11.1–15.9)
MCH: 30.4 pg (ref 26.6–33.0)
MCHC: 33.3 g/dL (ref 31.5–35.7)
MCV: 91 fL (ref 79–97)
Platelets: 272 10*3/uL (ref 150–450)
RBC: 4.24 x10E6/uL (ref 3.77–5.28)
RDW: 13.6 % (ref 11.7–15.4)
WBC: 6.2 10*3/uL (ref 3.4–10.8)

## 2022-12-12 LAB — VITAMIN D 25 HYDROXY (VIT D DEFICIENCY, FRACTURES): Vit D, 25-Hydroxy: 34.9 ng/mL (ref 30.0–100.0)

## 2022-12-12 LAB — TSH: TSH: 2.32 u[IU]/mL (ref 0.450–4.500)

## 2023-02-25 ENCOUNTER — Ambulatory Visit: Payer: Medicare HMO | Admitting: Family Medicine

## 2023-02-26 ENCOUNTER — Other Ambulatory Visit: Payer: Self-pay | Admitting: Family Medicine

## 2023-02-26 DIAGNOSIS — I10 Essential (primary) hypertension: Secondary | ICD-10-CM

## 2023-02-28 ENCOUNTER — Other Ambulatory Visit: Payer: Self-pay

## 2023-02-28 ENCOUNTER — Encounter: Payer: Self-pay | Admitting: Family Medicine

## 2023-02-28 ENCOUNTER — Ambulatory Visit (INDEPENDENT_AMBULATORY_CARE_PROVIDER_SITE_OTHER): Payer: Medicare HMO | Admitting: Family Medicine

## 2023-02-28 VITALS — BP 140/79 | HR 70 | Temp 98.6°F | Ht 63.0 in | Wt 178.0 lb

## 2023-02-28 DIAGNOSIS — N1832 Chronic kidney disease, stage 3b: Secondary | ICD-10-CM

## 2023-02-28 DIAGNOSIS — I129 Hypertensive chronic kidney disease with stage 1 through stage 4 chronic kidney disease, or unspecified chronic kidney disease: Secondary | ICD-10-CM | POA: Diagnosis not present

## 2023-02-28 DIAGNOSIS — G894 Chronic pain syndrome: Secondary | ICD-10-CM

## 2023-02-28 DIAGNOSIS — I10 Essential (primary) hypertension: Secondary | ICD-10-CM

## 2023-02-28 DIAGNOSIS — M7989 Other specified soft tissue disorders: Secondary | ICD-10-CM | POA: Diagnosis not present

## 2023-02-28 MED ORDER — HYDROCHLOROTHIAZIDE 12.5 MG PO TABS: 12.5000 mg | ORAL_TABLET | Freq: Every day | ORAL | 3 refills | Status: DC | PRN

## 2023-02-28 NOTE — Progress Notes (Unsigned)
Subjective: CC:*** PCP: Raliegh Ip, DO [id number] Wendy Barajas is a 82 y.o. female presenting to clinic today for:  1. ***   ROS: Per HPI  Allergies  Allergen Reactions   Pollen Extract    Past Medical History:  Diagnosis Date   Allergy    Arthritis    bone spurs - bilateral knee issues    GERD (gastroesophageal reflux disease)    Hyperlipidemia    Hypertension     Current Outpatient Medications:    amLODipine (NORVASC) 2.5 MG tablet, TAKE 1 TABLET EVERY DAY (DISCONTINUE 5MG  TABLET), Disp: 90 tablet, Rfl: 0   calcium carbonate (OS-CAL - DOSED IN MG OF ELEMENTAL CALCIUM) 1250 (500 Ca) MG tablet, Take 1 tablet by mouth., Disp: , Rfl:    Capsaicin-Menthol-Methyl Sal (CAPSAICIN-METHYL SAL-MENTHOL) 0.025-1-12 % CREA, Apply 1 application topically 4 (four) times daily as needed., Disp: 56.6 g, Rfl: 3   Cholecalciferol (VITAMIN D3) 400 UNITS CAPS, Take 1 capsule by mouth daily. , Disp: , Rfl:    DULoxetine (CYMBALTA) 30 MG capsule, Take 1 capsule (30 mg total) by mouth daily. For muscle pain, Disp: 90 capsule, Rfl: 0   fluticasone (FLONASE) 50 MCG/ACT nasal spray, Place 2 sprays into both nostrils daily., Disp: 16 g, Rfl: 6   loratadine (CLARITIN) 10 MG tablet, TAKE 1 TABLET EVERY DAY, Disp: 90 tablet, Rfl: 1   losartan (COZAAR) 50 MG tablet, Take 1 tablet (50 mg total) by mouth daily., Disp: 90 tablet, Rfl: 3   nystatin cream (MYCOSTATIN), APPLY 1 APPLICATION TOPICALLY TWO TIMES DAILY TO THE GROIN AREAS X 7-14 DAYS PER FLARE., Disp: 30 g, Rfl: 0   RABEprazole (ACIPHEX) 20 MG tablet, Take 1 tablet (20 mg total) by mouth daily., Disp: 90 tablet, Rfl: 3   simvastatin (ZOCOR) 20 MG tablet, Take 1 tablet (20 mg total) by mouth daily., Disp: 90 tablet, Rfl: 3   triamcinolone cream (KENALOG) 0.1 %, Apply 1 Application topically 2 (two) times daily as needed (itching from bug bites)., Disp: 30 g, Rfl: 0 Social History   Socioeconomic History   Marital status: Widowed    Spouse name:  Not on file   Number of children: 1   Years of education: Not on file   Highest education level: 11th grade  Occupational History   Occupation: Retired   Tobacco Use   Smoking status: Never   Smokeless tobacco: Never  Vaping Use   Vaping status: Never Used  Substance and Sexual Activity   Alcohol use: No    Alcohol/week: 0.0 standard drinks of alcohol   Drug use: No   Sexual activity: Not Currently  Other Topics Concern   Not on file  Social History Narrative   Live alone; Her son lives nearby   Social Determinants of Health   Financial Resource Strain: Low Risk  (03/07/2022)   Overall Financial Resource Strain (CARDIA)    Difficulty of Paying Living Expenses: Not hard at all  Food Insecurity: No Food Insecurity (03/07/2022)   Hunger Vital Sign    Worried About Running Out of Food in the Last Year: Never true    Ran Out of Food in the Last Year: Never true  Transportation Needs: No Transportation Needs (03/07/2022)   PRAPARE - Administrator, Civil Service (Medical): No    Lack of Transportation (Non-Medical): No  Physical Activity: Insufficiently Active (03/07/2022)   Exercise Vital Sign    Days of Exercise per Week: 7 days    Minutes of  Exercise per Session: 20 min  Stress: No Stress Concern Present (03/07/2022)   [employer] of Occupational Health - Occupational Stress Questionnaire    Feeling of Stress : Not at all  Social Connections: Moderately Integrated (03/07/2022)   Social Connection and Isolation Panel [NHANES]    Frequency of Communication with Friends and Family: More than three times a week    Frequency of Social Gatherings with Friends and Family: Twice a week    Attends Religious Services: More than 4 times per year    Active Member of [employer] or Organizations: Yes    Attends Banker Meetings: More than 4 times per year    Marital Status: Widowed  Intimate Partner Violence: Not At Risk (03/07/2022)   Humiliation, Afraid, Rape, and  Kick questionnaire    Fear of Current or Ex-Partner: No    Emotionally Abused: No    Physically Abused: No    Sexually Abused: No   Family History  Problem Relation Age of Onset   Cancer Father        unsure = thinks colon    Diabetes Mother    Heart disease Mother    Cancer Brother        unsure    Stroke Brother    Arthritis Sister        back issues     Objective: Office vital signs reviewed. There were no vitals taken for this visit.  Physical Examination:  General: Awake, alert, *** nourished, No acute distress HEENT: Normal    Neck: No masses palpated. No lymphadenopathy    Ears: Tympanic membranes intact, normal light reflex, no erythema, no bulging    Eyes: PERRLA, extraocular membranes intact, sclera ***    Nose: nasal turbinates moist, *** nasal discharge    Throat: moist mucus membranes, no erythema, *** tonsillar exudate.  Airway is patent Cardio: regular rate and rhythm, S1S2 heard, no murmurs appreciated Pulm: clear to auscultation bilaterally, no wheezes, rhonchi or rales; normal work of breathing on room air GI: soft, non-tender, non-distended, bowel sounds present x4, no hepatomegaly, no splenomegaly, no masses GU: external vaginal tissue ***, cervix ***, *** punctate lesions on cervix appreciated, *** discharge from cervical os, *** bleeding, *** cervical motion tenderness, *** abdominal/ adnexal masses Extremities: warm, well perfused, No edema, cyanosis or clubbing; +*** pulses bilaterally MSK: *** gait and *** station Skin: dry; intact; no rashes or lesions Neuro: *** Strength and light touch sensation grossly intact, *** DTRs ***/4  Assessment/ Plan: 82 y.o. female   ***  No orders of the defined types were placed in this encounter.  No orders of the defined types were placed in this encounter.    Raliegh Ip, DO [facility] [phone]

## 2023-02-28 NOTE — Patient Instructions (Signed)
Start hydrochlorothiazide.  1 capsule daily IF needed for swelling. Eat a banana every time you take this to help your potassium. Drink water.  See nurse in 2 weeks for blood pressure and repeat blood work.

## 2023-03-01 LAB — RENAL FUNCTION PANEL
Albumin: 4.5 g/dL (ref 3.7–4.7)
BUN/Creatinine Ratio: 15 (ref 12–28)
BUN: 20 mg/dL (ref 8–27)
CO2: 21 mmol/L (ref 20–29)
Calcium: 10 mg/dL (ref 8.7–10.3)
Chloride: 103 mmol/L (ref 96–106)
Creatinine, Ser: 1.34 mg/dL — ABNORMAL HIGH (ref 0.57–1.00)
Glucose: 94 mg/dL (ref 70–99)
Phosphorus: 3.8 mg/dL (ref 3.0–4.3)
Potassium: 4.4 mmol/L (ref 3.5–5.2)
Sodium: 142 mmol/L (ref 134–144)
eGFR: 40 mL/min/{1.73_m2} — ABNORMAL LOW (ref >59)

## 2023-03-03 ENCOUNTER — Other Ambulatory Visit: Payer: Self-pay | Admitting: Family Medicine

## 2023-03-03 DIAGNOSIS — G894 Chronic pain syndrome: Secondary | ICD-10-CM

## 2023-03-13 ENCOUNTER — Ambulatory Visit (INDEPENDENT_AMBULATORY_CARE_PROVIDER_SITE_OTHER): Payer: Medicare HMO

## 2023-03-13 VITALS — Ht 63.0 in | Wt 175.0 lb

## 2023-03-13 DIAGNOSIS — Z0000 Encounter for general adult medical examination without abnormal findings: Secondary | ICD-10-CM

## 2023-03-13 NOTE — Patient Instructions (Signed)
Wendy Barajas , Thank you for taking time to come for your Medicare Wellness Visit. I appreciate your ongoing commitment to your health goals. Please review the following plan we discussed and let me know if I can assist you in the future.   Referrals/Orders/Follow-Ups/Clinician Recommendations: Aim for 30 minutes of exercise or brisk walking, 6-8 glasses of water, and 5 servings of fruits and vegetables each day.   This is a list of the screening recommended for you and due dates:  Health Maintenance  Topic Date Due   Zoster (Shingles) Vaccine (1 of 2) Never done   COVID-19 Vaccine (1 - 2023-24 season) Never done   Medicare Annual Wellness Visit  03/08/2023   Flu Shot  03/13/2023   Pneumonia Vaccine (1 of 1 - PCV) 12/11/2023*   DTaP/Tdap/Td vaccine (2 - Td or Tdap) 02/11/2029   DEXA scan (bone density measurement)  Completed   HPV Vaccine  Aged Out   Hepatitis C Screening  Discontinued  *Topic was postponed. The date shown is not the original due date.    Advanced directives: (Provided) Advance directive discussed with you today. I have provided a copy for you to complete at home and have notarized. Once this is complete, please bring a copy in to our office so we can scan it into your chart. Information on Advanced Care Planning can be found at [facility] Advance Health Care Directives Advance Health Care Directives ([email or web address]    Next Medicare Annual Wellness Visit scheduled for next year: Yes  Preventive Care 65 Years and Older, Female Preventive care refers to lifestyle choices and visits with your health care provider that can promote health and wellness. What does preventive care include? A yearly physical exam. This is also called an annual well check. Dental exams once or twice a year. Routine eye exams. Ask your health care provider how often you should have your eyes checked. Personal lifestyle choices, including: Daily care of your teeth and  gums. Regular physical activity. Eating a healthy diet. Avoiding tobacco and drug use. Limiting alcohol use. Practicing safe sex. Taking low-dose aspirin every day. Taking vitamin and mineral supplements as recommended by your health care provider. What happens during an annual well check? The services and screenings done by your health care provider during your annual well check will depend on your age, overall health, lifestyle risk factors, and family history of disease. Counseling  Your health care provider may ask you questions about your: Alcohol use. Tobacco use. Drug use. Emotional well-being. Home and relationship well-being. Sexual activity. Eating habits. History of falls. Memory and ability to understand (cognition). Work and work Astronomer. Reproductive health. Screening  You may have the following tests or measurements: Height, weight, and BMI. Blood pressure. Lipid and cholesterol levels. These may be checked every 5 years, or more frequently if you are over 39 years old. Skin check. Lung cancer screening. You may have this screening every year starting at age 29 if you have a 30-pack-year history of smoking and currently smoke or have quit within the past 15 years. Fecal occult blood test (FOBT) of the stool. You may have this test every year starting at age 66. Flexible sigmoidoscopy or colonoscopy. You may have a sigmoidoscopy every 5 years or a colonoscopy every 10 years starting at age 33. Hepatitis C blood test. Hepatitis B blood test. Sexually transmitted disease (STD) testing. Diabetes screening. This is done by checking your blood sugar (glucose) after you have not eaten for  a while (fasting). You may have this done every 1-3 years. Bone density scan. This is done to screen for osteoporosis. You may have this done starting at age 50. Mammogram. This may be done every 1-2 years. Talk to your health care provider about how often you should have regular  mammograms. Talk with your health care provider about your test results, treatment options, and if necessary, the need for more tests. Vaccines  Your health care provider may recommend certain vaccines, such as: Influenza vaccine. This is recommended every year. Tetanus, diphtheria, and acellular pertussis (Tdap, Td) vaccine. You may need a Td booster every 10 years. Zoster vaccine. You may need this after age 69. Pneumococcal 13-valent conjugate (PCV13) vaccine. One dose is recommended after age 26. Pneumococcal polysaccharide (PPSV23) vaccine. One dose is recommended after age 80. Talk to your health care provider about which screenings and vaccines you need and how often you need them. This information is not intended to replace advice given to you by your health care provider. Make sure you discuss any questions you have with your health care provider. Document Released: 08/25/2015 Document Revised: 04/17/2016 Document Reviewed: 05/30/2015 Elsevier Interactive Patient Education  2017 [employer].  Fall Prevention in the Home Falls can cause injuries. They can happen to people of all ages. There are many things you can do to make your home safe and to help prevent falls. What can I do on the outside of my home? Regularly fix the edges of walkways and driveways and fix any cracks. Remove anything that might make you trip as you walk through a door, such as a raised step or threshold. Trim any bushes or trees on the path to your home. Use bright outdoor lighting. Clear any walking paths of anything that might make someone trip, such as rocks or tools. Regularly check to see if handrails are loose or broken. Make sure that both sides of any steps have handrails. Any raised decks and porches should have guardrails on the edges. Have any leaves, snow, or ice cleared regularly. Use sand or salt on walking paths during winter. Clean up any spills in your garage right away. This includes oil  or grease spills. What can I do in the bathroom? Use night lights. Install grab bars by the toilet and in the tub and shower. Do not use towel bars as grab bars. Use non-skid mats or decals in the tub or shower. If you need to sit down in the shower, use a plastic, non-slip stool. Keep the floor dry. Clean up any water that spills on the floor as soon as it happens. Remove soap buildup in the tub or shower regularly. Attach bath mats securely with double-sided non-slip rug tape. Do not have throw rugs and other things on the floor that can make you trip. What can I do in the bedroom? Use night lights. Make sure that you have a light by your bed that is easy to reach. Do not use any sheets or blankets that are too big for your bed. They should not hang down onto the floor. Have a firm chair that has side arms. You can use this for support while you get dressed. Do not have throw rugs and other things on the floor that can make you trip. What can I do in the kitchen? Clean up any spills right away. Avoid walking on wet floors. Keep items that you use a lot in easy-to-reach places. If you need to reach something above  you, use a strong step stool that has a grab bar. Keep electrical cords out of the way. Do not use floor polish or wax that makes floors slippery. If you must use wax, use non-skid floor wax. Do not have throw rugs and other things on the floor that can make you trip. What can I do with my stairs? Do not leave any items on the stairs. Make sure that there are handrails on both sides of the stairs and use them. Fix handrails that are broken or loose. Make sure that handrails are as long as the stairways. Check any carpeting to make sure that it is firmly attached to the stairs. Fix any carpet that is loose or worn. Avoid having throw rugs at the top or bottom of the stairs. If you do have throw rugs, attach them to the floor with carpet tape. Make sure that you have a light  switch at the top of the stairs and the bottom of the stairs. If you do not have them, ask someone to add them for you. What else can I do to help prevent falls? Wear shoes that: Do not have high heels. Have rubber bottoms. Are comfortable and fit you well. Are closed at the toe. Do not wear sandals. If you use a stepladder: Make sure that it is fully opened. Do not climb a closed stepladder. Make sure that both sides of the stepladder are locked into place. Ask someone to hold it for you, if possible. Clearly mark and make sure that you can see: Any grab bars or handrails. First and last steps. Where the edge of each step is. Use tools that help you move around (mobility aids) if they are needed. These include: Canes. Walkers. Scooters. Crutches. Turn on the lights when you go into a dark area. Replace any light bulbs as soon as they burn out. Set up your furniture so you have a clear path. Avoid moving your furniture around. If any of your floors are uneven, fix them. If there are any pets around you, be aware of where they are. Review your medicines with your doctor. Some medicines can make you feel dizzy. This can increase your chance of falling. Ask your doctor what other things that you can do to help prevent falls. This information is not intended to replace advice given to you by your health care provider. Make sure you discuss any questions you have with your health care provider. Document Released: 05/25/2009 Document Revised: 01/04/2016 Document Reviewed: 09/02/2014 Elsevier Interactive Patient Education  2017 [employer].

## 2023-03-13 NOTE — Progress Notes (Signed)
Subjective:   Wendy Barajas is a 82 y.o. female who presents for Medicare Annual (Subsequent) preventive examination.  Visit Complete: Virtual  I connected with  Wendy Barajas on 03/13/23 by a audio enabled telemedicine application and verified that I am speaking with the correct person using two identifiers.  Patient Location: Home  Provider Location: Home Office  I discussed the limitations of evaluation and management by telemedicine. The patient expressed understanding and agreed to proceed.  Patient Medicare AWV questionnaire was completed by the patient on 03/13/2023; I have confirmed that all information answered by patient is correct and no changes since this date.  Review of Systems    Vital Signs: Unable to obtain new vitals due to this being a telehealth visit.  Cardiac Risk Factors include: advanced age (>59men, >49 women);hypertension;dyslipidemia     Objective:    Today's Vitals   03/13/23 0832  Weight: 175 lb (79.4 kg)  Height: 5\' 3"  (1.6 m)   Body mass index is 31 kg/m.     03/13/2023    8:35 AM 06/18/2021   12:32 PM 03/06/2021    3:50 PM 12/09/2019    3:01 PM 12/08/2018    3:25 PM 10/30/2017   10:44 AM  Advanced Directives  Does Patient Have a Medical Advance Directive? No No Yes No No No  Type of Economist of Healthcare Power of Attorney in Chart?   No - copy requested     Would patient like information on creating a medical advance directive? Yes (MAU/Ambulatory/Procedural Areas - Information given)   No - Patient declined No - Patient declined Yes (ED - Information included in AVS)    Current Medications (verified) Outpatient Encounter Medications as of 03/13/2023  Medication Sig   amLODipine (NORVASC) 2.5 MG tablet TAKE 1 TABLET EVERY DAY (DISCONTINUE 5MG  TABLET)   calcium carbonate (OS-CAL - DOSED IN MG OF ELEMENTAL CALCIUM) 1250 (500 Ca) MG tablet Take 1 tablet by mouth.   Capsaicin-Menthol-Methyl Sal  (CAPSAICIN-METHYL SAL-MENTHOL) 0.025-1-12 % CREA Apply 1 application topically 4 (four) times daily as needed.   Cholecalciferol (VITAMIN D3) 400 UNITS CAPS Take 1 capsule by mouth daily.    DULoxetine (CYMBALTA) 30 MG capsule TAKE 1 CAPSULE (30 MG TOTAL) BY MOUTH DAILY FOR MUSCLE PAIN   fluticasone (FLONASE) 50 MCG/ACT nasal spray Place 2 sprays into both nostrils daily.   hydrochlorothiazide (HYDRODIURIL) 12.5 MG tablet Take 1 tablet (12.5 mg total) by mouth daily as needed (swelling or high blood pressure above 140/90).   loratadine (CLARITIN) 10 MG tablet TAKE 1 TABLET EVERY DAY   losartan (COZAAR) 50 MG tablet Take 1 tablet (50 mg total) by mouth daily.   nystatin cream (MYCOSTATIN) APPLY 1 APPLICATION TOPICALLY TWO TIMES DAILY TO THE GROIN AREAS X 7-14 DAYS PER FLARE.   RABEprazole (ACIPHEX) 20 MG tablet Take 1 tablet (20 mg total) by mouth daily.   simvastatin (ZOCOR) 20 MG tablet Take 1 tablet (20 mg total) by mouth daily.   triamcinolone cream (KENALOG) 0.1 % Apply 1 Application topically 2 (two) times daily as needed (itching from bug bites).   No facility-administered encounter medications on file as of 03/13/2023.    Allergies (verified) Pollen extract   History: Past Medical History:  Diagnosis Date   Allergy    Arthritis    bone spurs - bilateral knee issues    GERD (gastroesophageal reflux disease)    Hyperlipidemia    Hypertension  Past Surgical History:  Procedure Laterality Date   ABDOMINAL HYSTERECTOMY     COLONOSCOPY     EYE SURGERY Bilateral    cataract removal    Family History  Problem Relation Age of Onset   Cancer Father        unsure = thinks colon    Diabetes Mother    Heart disease Mother    Cancer Brother        unsure    Stroke Brother    Arthritis Sister        back issues    Social History   Socioeconomic History   Marital status: Widowed    Spouse name: Not on file   Number of children: 1   Years of education: Not on file    Highest education level: 11th grade  Occupational History   Occupation: Retired   Tobacco Use   Smoking status: Never   Smokeless tobacco: Never  Vaping Use   Vaping status: Never Used  Substance and Sexual Activity   Alcohol use: No    Alcohol/week: 0.0 standard drinks of alcohol   Drug use: No   Sexual activity: Not Currently  Other Topics Concern   Not on file  Social History Narrative   Live alone; Her son lives nearby   Social Determinants of Health   Financial Resource Strain: Low Risk  (03/13/2023)   Overall Financial Resource Strain (CARDIA)    Difficulty of Paying Living Expenses: Not hard at all  Food Insecurity: No Food Insecurity (03/13/2023)   Hunger Vital Sign    Worried About Running Out of Food in the Last Year: Never true    Ran Out of Food in the Last Year: Never true  Transportation Needs: No Transportation Needs (03/13/2023)   PRAPARE - Administrator, Civil Service (Medical): No    Lack of Transportation (Non-Medical): No  Physical Activity: Insufficiently Active (03/13/2023)   Exercise Vital Sign    Days of Exercise per Week: 4 days    Minutes of Exercise per Session: 10 min  Stress: No Stress Concern Present (03/13/2023)   [employer] of Occupational Health - Occupational Stress Questionnaire    Feeling of Stress : Not at all  Social Connections: Moderately Isolated (03/13/2023)   Social Connection and Isolation Panel [NHANES]    Frequency of Communication with Friends and Family: More than three times a week    Frequency of Social Gatherings with Friends and Family: More than three times a week    Attends Religious Services: More than 4 times per year    Active Member of [employer] or Organizations: No    Attends Banker Meetings: Never    Marital Status: Widowed    Tobacco Counseling Counseling given: Not Answered   Clinical Intake:  Pre-visit preparation completed: Yes  Pain : No/denies pain     Nutritional Risks:  None Diabetes: No  How often do you need to have someone help you when you read instructions, pamphlets, or other written materials from your doctor or pharmacy?: 1 - Never  Interpreter Needed?: No  Information entered by :: Renie Ora, LPN   Activities of Daily Living    03/13/2023    8:35 AM  In your present state of health, do you have any difficulty performing the following activities:  Hearing? 0  Vision? 0  Difficulty concentrating or making decisions? 0  Walking or climbing stairs? 0  Dressing or bathing? 0  Doing errands, shopping? 0  Preparing Food and eating ? N  Using the Toilet? N  In the past six months, have you accidently leaked urine? N  Do you have problems with loss of bowel control? N  Managing your Medications? N  Managing your Finances? N  Housekeeping or managing your Housekeeping? N    Patient Care Team: Raliegh Ip, DO as PCP - General (Family Medicine) Estanislado Emms, MD as Consulting Physician (Nephrology)  Indicate any recent Medical Services you may have received from other than [facility] providers in the past year (date may be approximate).     Assessment:   This is a routine wellness examination for Ivanell.  Hearing/Vision screen Vision Screening - Comments:: Wears rx glasses - up to date with routine eye exams with  Dr.Johnson   Dietary issues and exercise activities discussed:     Goals Addressed             This Visit's Progress    Prevent falls   On track      Depression Screen    03/13/2023    8:34 AM 12/11/2022   10:28 AM 06/04/2022   11:11 AM 03/07/2022    3:21 PM 10/24/2021   10:13 AM 04/26/2021    2:01 PM 03/06/2021    3:41 PM  PHQ 2/9 Scores  PHQ - 2 Score 0 0 0 2 0 0 0  PHQ- 9 Score  0  3 1 2      Fall Risk    03/13/2023    8:33 AM 06/04/2022   11:11 AM 03/07/2022    3:19 PM 10/24/2021   10:13 AM 04/26/2021    2:01 PM  Fall Risk   Falls in the past year? 0 0 0 0 0  Number falls in past yr: 0  0    Injury  with Fall? 0  0    Risk for fall due to : No Fall Risks  No Fall Risks    Follow up Falls prevention discussed  Falls prevention discussed      MEDICARE RISK AT HOME:  Medicare Risk at Home - 03/13/23 6962     Any stairs in or around the home? No    If so, are there any without handrails? No    Home free of loose throw rugs in walkways, pet beds, electrical cords, etc? Yes    Adequate lighting in your home to reduce risk of falls? Yes    Life alert? No    Use of a cane, walker or w/c? No    Grab bars in the bathroom? No    Shower chair or bench in shower? No    Elevated toilet seat or a handicapped toilet? No             TIMED UP AND GO:  Was the test performed?  No    Cognitive Function:    04/26/2021    2:27 PM 10/30/2017   10:46 AM  MMSE - Mini Mental State Exam  Orientation to time 5 5  Orientation to Place 5 5  Registration 3 3  Attention/ Calculation 5 5  Recall 2 3  Language- name 2 objects 2 2  Language- repeat 1 1  Language- follow 3 step command 3 3  Language- read & follow direction 1 1  Write a sentence 1 1  Copy design 1 1  Total score 29 30        03/13/2023    8:35 AM 03/07/2022    3:21  PM 12/09/2019    3:03 PM 12/08/2018    3:28 PM  6CIT Screen  What Year? 0 points 0 points 0 points 0 points  What month? 0 points 0 points 0 points 0 points  What time? 0 points 0 points 0 points 0 points  Count back from 20 0 points 0 points 0 points 0 points  Months in reverse 0 points 2 points 0 points 2 points  Repeat phrase 0 points 0 points 0 points 0 points  Total Score 0 points 2 points 0 points 2 points    Immunizations Immunization History  Administered Date(s) Administered   Influenza, High Dose Seasonal PF 06/26/2017   Influenza,inj,Quad PF,6+ Mos 06/20/2015   Tdap 02/12/2019    TDAP status: Due, Education has been provided regarding the importance of this vaccine. Advised may receive this vaccine at local pharmacy or Health Dept. Aware to  provide a copy of the vaccination record if obtained from local pharmacy or Health Dept. Verbalized acceptance and understanding.  Flu Vaccine status: Declined, Education has been provided regarding the importance of this vaccine but patient still declined. Advised may receive this vaccine at local pharmacy or Health Dept. Aware to provide a copy of the vaccination record if obtained from local pharmacy or Health Dept. Verbalized acceptance and understanding.  Pneumococcal vaccine status: Declined,  Education has been provided regarding the importance of this vaccine but patient still declined. Advised may receive this vaccine at local pharmacy or Health Dept. Aware to provide a copy of the vaccination record if obtained from local pharmacy or Health Dept. Verbalized acceptance and understanding.   Covid-19 vaccine status: Declined, Education has been provided regarding the importance of this vaccine but patient still declined. Advised may receive this vaccine at local pharmacy or Health Dept.or vaccine clinic. Aware to provide a copy of the vaccination record if obtained from local pharmacy or Health Dept. Verbalized acceptance and understanding.  Qualifies for Shingles Vaccine? Yes   Zostavax completed No   Shingrix Completed?: No.    Education has been provided regarding the importance of this vaccine. Patient has been advised to call insurance company to determine out of pocket expense if they have not yet received this vaccine. Advised may also receive vaccine at local pharmacy or Health Dept. Verbalized acceptance and understanding.  Screening Tests Health Maintenance  Topic Date Due   Zoster Vaccines- Shingrix (1 of 2) Never done   COVID-19 Vaccine (1 - 2023-24 season) Never done   INFLUENZA VACCINE  03/13/2023   Pneumonia Vaccine 29+ Years old (1 of 1 - PCV) 12/11/2023 (Originally 07/04/2005)   Medicare Annual Wellness (AWV)  03/12/2024   DTaP/Tdap/Td (2 - Td or Tdap) 02/11/2029   DEXA  SCAN  Completed   HPV VACCINES  Aged Out   Hepatitis C Screening  Discontinued    Health Maintenance  Health Maintenance Due  Topic Date Due   Zoster Vaccines- Shingrix (1 of 2) Never done   COVID-19 Vaccine (1 - 2023-24 season) Never done   INFLUENZA VACCINE  03/13/2023    Colorectal cancer screening: No longer required.   Mammogram status: No longer required due to age.  Bone Density status: Completed 10/30/2017. Results reflect: Bone density results: OSTEOPENIA. Repeat every 5 years.  Lung Cancer Screening: (Low Dose CT Chest recommended if Age 36-80 years, 20 pack-year currently smoking OR have quit w/in 15years.) does not qualify.   Lung Cancer Screening Referral: n/a  Additional Screening:  Hepatitis C Screening: does not qualify;  Vision Screening: Recommended annual ophthalmology exams for early detection of glaucoma and other disorders of the eye. Is the patient up to date with their annual eye exam?  No  Who is the provider or what is the name of the office in which the patient attends annual eye exams? Dr.Johnson  If pt is not established with a provider, would they like to be referred to a provider to establish care? No .   Dental Screening: Recommended annual dental exams for proper oral hygiene    Community Resource Referral / Chronic Care Management: CRR required this visit?  No   CCM required this visit?  No     Plan:     I have personally reviewed and noted the following in the patient's chart:   Medical and social history Use of alcohol, tobacco or illicit drugs  Current medications and supplements including opioid prescriptions. Patient is not currently taking opioid prescriptions. Functional ability and status Nutritional status Physical activity Advanced directives List of other physicians Hospitalizations, surgeries, and ER visits in previous 12 months Vitals Screenings to include cognitive, depression, and falls Referrals and  appointments  In addition, I have reviewed and discussed with patient certain preventive protocols, quality metrics, and best practice recommendations. A written personalized care plan for preventive services as well as general preventive health recommendations were provided to patient.     Lorrene Reid, LPN   12/17/8467   After Visit Summary: (MyChart) Due to this being a telephonic visit, the after visit summary with patients personalized plan was offered to patient via MyChart   Nurse Notes: Declines all Vaccines

## 2023-03-21 ENCOUNTER — Ambulatory Visit: Payer: Medicare HMO

## 2023-03-21 ENCOUNTER — Ambulatory Visit (INDEPENDENT_AMBULATORY_CARE_PROVIDER_SITE_OTHER): Payer: Medicare HMO | Admitting: *Deleted

## 2023-03-21 VITALS — BP 127/81 | HR 63

## 2023-03-21 DIAGNOSIS — M7989 Other specified soft tissue disorders: Secondary | ICD-10-CM | POA: Diagnosis not present

## 2023-03-21 DIAGNOSIS — I10 Essential (primary) hypertension: Secondary | ICD-10-CM

## 2023-03-21 NOTE — Progress Notes (Signed)
Patient came in today for a BP check. Patients BP 127/81   Pulse 63   Patient aware that we will send results to Dr. Nadine Counts and will call her if any changes needed to be made.

## 2023-04-10 DIAGNOSIS — Z0100 Encounter for examination of eyes and vision without abnormal findings: Secondary | ICD-10-CM | POA: Diagnosis not present

## 2023-04-10 DIAGNOSIS — H5210 Myopia, unspecified eye: Secondary | ICD-10-CM | POA: Diagnosis not present

## 2023-04-12 ENCOUNTER — Other Ambulatory Visit: Payer: Self-pay | Admitting: Family Medicine

## 2023-04-12 DIAGNOSIS — I10 Essential (primary) hypertension: Secondary | ICD-10-CM

## 2023-04-12 DIAGNOSIS — E7800 Pure hypercholesterolemia, unspecified: Secondary | ICD-10-CM

## 2023-04-26 ENCOUNTER — Other Ambulatory Visit: Payer: Self-pay | Admitting: Family Medicine

## 2023-04-26 DIAGNOSIS — K219 Gastro-esophageal reflux disease without esophagitis: Secondary | ICD-10-CM

## 2023-05-02 ENCOUNTER — Telehealth: Payer: Self-pay | Admitting: Family Medicine

## 2023-05-02 DIAGNOSIS — I10 Essential (primary) hypertension: Secondary | ICD-10-CM

## 2023-05-02 DIAGNOSIS — E7800 Pure hypercholesterolemia, unspecified: Secondary | ICD-10-CM

## 2023-05-02 MED ORDER — LOSARTAN POTASSIUM 50 MG PO TABS
50.0000 mg | ORAL_TABLET | Freq: Every day | ORAL | 1 refills | Status: DC
Start: 2023-05-02 — End: 2023-08-15

## 2023-05-02 MED ORDER — SIMVASTATIN 20 MG PO TABS
20.0000 mg | ORAL_TABLET | Freq: Every day | ORAL | 1 refills | Status: DC
Start: 2023-05-02 — End: 2023-08-15

## 2023-05-02 NOTE — Telephone Encounter (Signed)
Aware refills sent to pharmacy

## 2023-05-02 NOTE — Telephone Encounter (Signed)
  Prescription Request  05/02/2023  Is this a "Controlled Substance" medicine?   Have you seen your PCP in the last 2 weeks? LOV 02/28/23 NOV 08/15/23  If YES, route message to pool  -  If NO, patient needs to be scheduled for appointment.  What is the name of the medication or equipment? simvastatin (ZOCOR) 20 MG tablet  losartan (COZAAR) 50 MG tablet  Have you contacted your pharmacy to request a refill? yes   Which pharmacy would you like this sent to?  [facility] - [place], [place] - [address]      Patient notified that their request is being sent to the clinical staff for review and that they should receive a response within 2 business days.

## 2023-05-10 ENCOUNTER — Other Ambulatory Visit: Payer: Self-pay | Admitting: Family Medicine

## 2023-05-10 DIAGNOSIS — I10 Essential (primary) hypertension: Secondary | ICD-10-CM

## 2023-07-27 ENCOUNTER — Other Ambulatory Visit: Payer: Self-pay | Admitting: Family Medicine

## 2023-07-27 DIAGNOSIS — I10 Essential (primary) hypertension: Secondary | ICD-10-CM

## 2023-08-04 ENCOUNTER — Other Ambulatory Visit: Payer: Self-pay | Admitting: Family Medicine

## 2023-08-07 ENCOUNTER — Other Ambulatory Visit: Payer: Self-pay | Admitting: Family Medicine

## 2023-08-07 ENCOUNTER — Telehealth: Payer: Self-pay | Admitting: Family Medicine

## 2023-08-07 MED ORDER — LORATADINE 10 MG PO TABS: 10.0000 mg | ORAL_TABLET | Freq: Every day | ORAL | 0 refills | Status: DC

## 2023-08-07 NOTE — Telephone Encounter (Signed)
New rx sent over. 

## 2023-08-07 NOTE — Telephone Encounter (Signed)
Copied from CRM [id number]. Topic: Clinical - Medication Refill >> Aug 07, 2023 12:24 PM Geroge Baseman wrote: Most Recent Primary Care Visit:  Provider: Tamera Punt  Department: [facility]  Visit Type: NURSE VISIT  Date: 03/21/2023  Medication: loratadine (CLARITIN) 10 MG tablet  Has the patient contacted their pharmacy? Yes Need RX  sent over to pharm  Is this the correct pharmacy for this prescription? Yes If no, delete pharmacy and type the correct one.  This is the patient's preferred pharmacy:    [facility] - [place], [place] - [address] [place] [address] Phone: [phone] Fax: [phone]   Has the prescription been filled recently? No  Is the patient out of the medication? Yes  Has the patient been seen for an appointment in the last year OR does the patient have an upcoming appointment? Yes  Can we respond through MyChart? Phone call.  Agent: Please be advised that Rx refills may take up to 3 business days. We ask that you follow-up with your pharmacy.

## 2023-08-15 ENCOUNTER — Ambulatory Visit (INDEPENDENT_AMBULATORY_CARE_PROVIDER_SITE_OTHER): Payer: Medicare HMO | Admitting: Family Medicine

## 2023-08-15 ENCOUNTER — Encounter: Payer: Self-pay | Admitting: Family Medicine

## 2023-08-15 VITALS — BP 162/79 | HR 84 | Temp 98.0°F | Ht 63.0 in | Wt 178.0 lb

## 2023-08-15 DIAGNOSIS — N1832 Chronic kidney disease, stage 3b: Secondary | ICD-10-CM | POA: Diagnosis not present

## 2023-08-15 DIAGNOSIS — M7989 Other specified soft tissue disorders: Secondary | ICD-10-CM

## 2023-08-15 DIAGNOSIS — I10 Essential (primary) hypertension: Secondary | ICD-10-CM | POA: Diagnosis not present

## 2023-08-15 DIAGNOSIS — K219 Gastro-esophageal reflux disease without esophagitis: Secondary | ICD-10-CM | POA: Diagnosis not present

## 2023-08-15 DIAGNOSIS — E7800 Pure hypercholesterolemia, unspecified: Secondary | ICD-10-CM

## 2023-08-15 MED ORDER — SIMVASTATIN 20 MG PO TABS
20.0000 mg | ORAL_TABLET | Freq: Every day | ORAL | 3 refills | Status: DC
Start: 2023-08-15 — End: 2023-10-16

## 2023-08-15 MED ORDER — RABEPRAZOLE SODIUM 20 MG PO TBEC
20.0000 mg | DELAYED_RELEASE_TABLET | Freq: Every day | ORAL | 3 refills | Status: DC
Start: 2023-08-15 — End: 2023-10-16

## 2023-08-15 MED ORDER — AMLODIPINE BESYLATE 2.5 MG PO TABS
ORAL_TABLET | ORAL | 3 refills | Status: DC
Start: 2023-08-15 — End: 2023-10-16

## 2023-08-15 MED ORDER — HYDROCHLOROTHIAZIDE 12.5 MG PO TABS
12.5000 mg | ORAL_TABLET | Freq: Every day | ORAL | 3 refills | Status: DC | PRN
Start: 2023-08-15 — End: 2023-10-16

## 2023-08-15 MED ORDER — LOSARTAN POTASSIUM 50 MG PO TABS
50.0000 mg | ORAL_TABLET | Freq: Every day | ORAL | 3 refills | Status: DC
Start: 2023-08-15 — End: 2023-10-16

## 2023-08-15 NOTE — Progress Notes (Signed)
 Subjective: CC: Follow-up hypertension associated with CKD PCP: [name], DO Wendy Barajas is a 83 y.o. female presenting to clinic today for:  1.  Hypertension associated with CKD 3 Patient under the care of Dr. Jerrye with nephrology.  She has an appointment at the beginning of February.  Needs to have labs done.  She reports some improvement of edema with HCTZ but no total resolution.  She is compliant with all blood pressure medications as prescribed.  No chest pain, shortness of breath or dizziness reported.  She occasionally gets a cramp in her leg but nothing significant.  She did not take blood pressure medication today.  She is not monitoring blood pressure at home because she could not manage the cuff on her own so she returned to the store.   ROS: Per HPI  Allergies  Allergen Reactions   Pollen Extract    Past Medical History:  Diagnosis Date   Allergy    Arthritis    bone spurs - bilateral knee issues    GERD (gastroesophageal reflux disease)    Hyperlipidemia    Hypertension     Current Outpatient Medications:    amLODipine  (NORVASC ) 2.5 MG tablet, TAKE 1 TABLET EVERY DAY (DISCONTINUE 5MG  TABLET), Disp: 90 tablet, Rfl: 0   calcium carbonate (OS-CAL - DOSED IN MG OF ELEMENTAL CALCIUM) 1250 (500 Ca) MG tablet, Take 1 tablet by mouth., Disp: , Rfl:    Capsaicin-Menthol-Methyl Sal (CAPSAICIN-METHYL SAL-MENTHOL) 0.025-1-12 % CREA, Apply 1 application topically 4 (four) times daily as needed., Disp: 56.6 g, Rfl: 3   Cholecalciferol (VITAMIN D3) 400 UNITS CAPS, Take 1 capsule by mouth daily. , Disp: , Rfl:    DULoxetine  (CYMBALTA ) 30 MG capsule, TAKE 1 CAPSULE (30 MG TOTAL) BY MOUTH DAILY FOR MUSCLE PAIN, Disp: 90 capsule, Rfl: 0   fluticasone  (FLONASE ) 50 MCG/ACT nasal spray, Place 2 sprays into both nostrils daily., Disp: 16 g, Rfl: 6   hydrochlorothiazide  (HYDRODIURIL ) 12.5 MG tablet, Take 1 tablet (12.5 mg total) by mouth daily as needed (swelling or high  blood pressure above 140/90)., Disp: 90 tablet, Rfl: 3   loratadine  (CLARITIN ) 10 MG tablet, Take 1 tablet (10 mg total) by mouth daily., Disp: 90 tablet, Rfl: 0   losartan  (COZAAR ) 50 MG tablet, Take 1 tablet (50 mg total) by mouth daily., Disp: 90 tablet, Rfl: 1   nystatin  cream (MYCOSTATIN ), APPLY 1 APPLICATION TOPICALLY TWO TIMES DAILY TO THE GROIN AREAS X 7-14 DAYS PER FLARE., Disp: 30 g, Rfl: 0   RABEprazole  (ACIPHEX ) 20 MG tablet, TAKE 1 TABLET EVERY DAY, Disp: 90 tablet, Rfl: 1   simvastatin  (ZOCOR ) 20 MG tablet, Take 1 tablet (20 mg total) by mouth daily., Disp: 90 tablet, Rfl: 1   triamcinolone  cream (KENALOG ) 0.1 %, Apply 1 Application topically 2 (two) times daily as needed (itching from bug bites)., Disp: 30 g, Rfl: 0 Social History   Socioeconomic History   Marital status: Widowed    Spouse name: Not on file   Number of children: 1   Years of education: Not on file   Highest education level: 11th grade  Occupational History   Occupation: Retired   Tobacco Use   Smoking status: Never   Smokeless tobacco: Never  Vaping Use   Vaping status: Never Used  Substance and Sexual Activity   Alcohol use: No    Alcohol/week: 0.0 standard drinks of alcohol   Drug use: No   Sexual activity: Not Currently  Other Topics Concern  Not on file  Social History Narrative   Live alone; Her son lives nearby   Social Drivers of Corporate Investment Banker Strain: Low Risk  (03/13/2023)   Overall Financial Resource Strain (CARDIA)    Difficulty of Paying Living Expenses: Not hard at all  Food Insecurity: No Food Insecurity (03/13/2023)   Hunger Vital Sign    Worried About Running Out of Food in the Last Year: Never true    Ran Out of Food in the Last Year: Never true  Transportation Needs: No Transportation Needs (03/13/2023)   PRAPARE - Administrator, Civil Service (Medical): No    Lack of Transportation (Non-Medical): No  Physical Activity: Insufficiently Active (03/13/2023)    Exercise Vital Sign    Days of Exercise per Week: 4 days    Minutes of Exercise per Session: 10 min  Stress: No Stress Concern Present (03/13/2023)   [employer] of Occupational Health - Occupational Stress Questionnaire    Feeling of Stress : Not at all  Social Connections: Moderately Isolated (03/13/2023)   Social Connection and Isolation Panel [NHANES]    Frequency of Communication with Friends and Family: More than three times a week    Frequency of Social Gatherings with Friends and Family: More than three times a week    Attends Religious Services: More than 4 times per year    Active Member of [employer] or Organizations: No    Attends Banker Meetings: Never    Marital Status: Widowed  Intimate Partner Violence: Not At Risk (03/13/2023)   Humiliation, Afraid, Rape, and Kick questionnaire    Fear of Current or Ex-Partner: No    Emotionally Abused: No    Physically Abused: No    Sexually Abused: No   Family History  Problem Relation Age of Onset   Cancer Father        unsure = thinks colon    Diabetes Mother    Heart disease Mother    Cancer Brother        unsure    Stroke Brother    Arthritis Sister        back issues     Objective: Office vital signs reviewed. BP (!) 162/79   Pulse 84   Temp 98 F (36.7 C) (Temporal)   Ht 5' 3 (1.6 m)   Wt 178 lb (80.7 kg)   SpO2 97%   BMI 31.53 kg/m   Physical Examination:  General: Awake, alert, well nourished, No acute distress HEENT: Sclera white.  Moist mucous membranes Cardio: regular rate and rhythm, S1S2 heard, no murmurs appreciated Pulm: clear to auscultation bilaterally, no wheezes, rhonchi or rales; normal work of breathing on room air Extremities: Trace nonpitting lower extremity edema present  Assessment/ Plan: 83 y.o. female   Essential hypertension - Plan: amLODipine  (NORVASC ) 2.5 MG tablet, losartan  (COZAAR ) 50 MG tablet  Chronic kidney disease, stage 3b (HCC)  Leg swelling - Plan:  hydrochlorothiazide  (HYDRODIURIL ) 12.5 MG tablet  Gastroesophageal reflux disease without esophagitis - Plan: RABEprazole  (ACIPHEX ) 20 MG tablet  Pure hypercholesterolemia - Plan: simvastatin  (ZOCOR ) 20 MG tablet  Blood pressure not at goal but has not taken blood pressure medication yet due to need for renal labs.  She will return in 2 weeks for blood pressure check with nurse as she is not checking blood pressure at home.  Did not discuss GERD or hyperlipidemia today but needed refills prior to her next visit.  Rx has been sent  She declined all vaccines today   Norene CHRISTELLA Fielding, DO [facility] [phone]

## 2023-08-17 LAB — LAB REPORT - SCANNED: Creatinine, POC: 184.6 mg/dL

## 2023-09-01 ENCOUNTER — Ambulatory Visit: Payer: Self-pay | Admitting: Family Medicine

## 2023-09-01 ENCOUNTER — Telehealth: Payer: Self-pay | Admitting: Family Medicine

## 2023-09-01 ENCOUNTER — Ambulatory Visit: Payer: Medicare HMO

## 2023-09-01 NOTE — Telephone Encounter (Signed)
Copied from CRM [id number]. Topic: Clinical - Prescription Issue >> Sep 01, 2023  4:18 PM Clayton Bibles wrote: Reason for CRM: hydrochlorothiazide; She is waiting on a prescription for amlodipine. [facility]     Disposition: [] ED /[] Urgent Care (no appt availability in office) / [] Appointment(In office/virtual)/ []  [facility]/ [] Home Care/ [] Refused Recommended Disposition /[] [facility]/ []  Follow-up with PCP Additional Notes: Patient called and advised that she was at her doctor's office today and was told medications would be at her pharmacy.  [facility]  This RN spoke with pharmacy staff to see what medications the patient has picked up and what she needs.  Patient states she was told earlier by someone that she needed to pick up "Amolipamlodipine" and this was the patient's exact spelling of what she wrote down when someone spelled the medication to her.  This RN spoke with pharmacy staff.  Pharmacy staff explained to the patient that her medication list is saying Amlodipine and it was just written down today.  Patient stated that a receptionist at the PCP office spelled out that "Amolipamlidipine" to her earlier and the pharmacy staff was explaining to her that Amlodipine and Hydrochlorithiazide were the only medications that they had in their system and that spelling was not correct.  Patient still seemed confused.  This RN went over the previous notes that the patient is supposed to take Amlodipine 2.5mg  tablet daily and the pharmacy staff went over that with her as well.  She stated that she gets that medication in the mail and had been taking that.  Pharmacy staff and myself both think the patient thought that there was a 3rd medication "Amolipamlidipine".  Patient states that she will take her medications like she is supposed to.  Patient picked up one medication at the pharmacy today and that was the Hydrochlorithiazide.  This RN reviewed with her  the instructions on how she was supposed to take that.  Patient agreed that she was advised to take 1 tablet daily as needed for swelling or BP over 140/90.  I advised her that if there was anything different that she was supposed to receive today someone would give her a call.  Patient verbalized understanding.  Reason for Disposition . [1] Caller requesting NON-URGENT health information AND [2] PCP's office is the best resource  Answer Assessment - Initial Assessment Questions 1. REASON FOR CALL or QUESTION: "What is your reason for calling today?" or "How can I best help you?" or "What question do you have that I can help answer?"     Patient at pharmacy and thought that there was a new medication she was supposed to pick up that was not there  Protocols used: Information Only Call - No Triage-A-AH

## 2023-09-01 NOTE — Progress Notes (Signed)
Called patient and advised her of Dr. Nadine Counts message. Patient expressed understanding and scheduled appt in 2 weeks for BP check with triage.

## 2023-09-01 NOTE — Progress Notes (Deleted)
Patient is in office today for a nurse visit for Blood Pressure Check. Patient {NurseVisitDetails:30447}

## 2023-09-01 NOTE — Progress Notes (Signed)
Patient is in office today for a nurse visit for Blood Pressure Check. Patient blood pressure was 174/90, Patient Positive for dyspnea, fatigue, and lower extremity edema - patient states that this is chronic with no changes. Patient has not started the amlodipine due to being sick (vomiting and diarrhea), patient states that this has resolved  Patient is concerns about stating the medication and its effects on her kidneys.  Please review and advise.  Re checked BP  2nd reading 156/91

## 2023-09-01 NOTE — Telephone Encounter (Signed)
  Chief Complaint: medication question  Additional Notes: Patient states she was seen today in the office and told to pick up amlodipine from her pharmacy. Patient states when she went to the pharmacy she was given hydrochlorothiazide. Patient states she is confused and was not aware she was supposed to be taking the medication. Reviewed with patient that she is prescribed amlodipine 2.5mg  1 tablet daily and in addition hydrochlorothiazide 1 tablet daily as needed for swelling or BP over 140/90. Patient states she was not aware, verbalizes understanding and state she will go pick up the medications and start them.  Copied from CRM [id number]. Topic: Clinical - Medication Question >> Sep 01, 2023  3:13 PM Donita Brooks wrote: Reason for CRM: pt is stating that when she went to go pick up medication from pharmacy, the medication is different from what was told this morning.

## 2023-09-02 NOTE — Telephone Encounter (Signed)
Amlodipine sent 08/15/2023 to [facility], this should be on file. Not sure if it was put back because she waited to long to pick it up.

## 2023-09-15 ENCOUNTER — Ambulatory Visit: Payer: Medicare HMO

## 2023-09-15 VITALS — BP 142/78 | HR 85

## 2023-09-15 DIAGNOSIS — I10 Essential (primary) hypertension: Secondary | ICD-10-CM

## 2023-09-15 NOTE — Progress Notes (Signed)
Pt here for blood pressure check.  1st b/p - 152/78 hr 90 2nd b/p - 142/78 hr 85

## 2023-09-22 DIAGNOSIS — G8929 Other chronic pain: Secondary | ICD-10-CM | POA: Diagnosis not present

## 2023-09-22 DIAGNOSIS — K219 Gastro-esophageal reflux disease without esophagitis: Secondary | ICD-10-CM | POA: Diagnosis not present

## 2023-09-22 DIAGNOSIS — I129 Hypertensive chronic kidney disease with stage 1 through stage 4 chronic kidney disease, or unspecified chronic kidney disease: Secondary | ICD-10-CM | POA: Diagnosis not present

## 2023-09-22 DIAGNOSIS — Z87442 Personal history of urinary calculi: Secondary | ICD-10-CM | POA: Diagnosis not present

## 2023-09-22 DIAGNOSIS — E872 Acidosis, unspecified: Secondary | ICD-10-CM | POA: Diagnosis not present

## 2023-09-22 DIAGNOSIS — N1832 Chronic kidney disease, stage 3b: Secondary | ICD-10-CM | POA: Diagnosis not present

## 2023-09-22 DIAGNOSIS — M25562 Pain in left knee: Secondary | ICD-10-CM | POA: Diagnosis not present

## 2023-10-16 ENCOUNTER — Other Ambulatory Visit: Payer: Self-pay | Admitting: *Deleted

## 2023-10-16 DIAGNOSIS — I10 Essential (primary) hypertension: Secondary | ICD-10-CM

## 2023-10-16 DIAGNOSIS — E7800 Pure hypercholesterolemia, unspecified: Secondary | ICD-10-CM

## 2023-10-16 DIAGNOSIS — K219 Gastro-esophageal reflux disease without esophagitis: Secondary | ICD-10-CM

## 2023-10-16 DIAGNOSIS — M7989 Other specified soft tissue disorders: Secondary | ICD-10-CM

## 2023-10-16 DIAGNOSIS — G894 Chronic pain syndrome: Secondary | ICD-10-CM

## 2023-10-16 MED ORDER — SIMVASTATIN 20 MG PO TABS: 20.0000 mg | ORAL_TABLET | Freq: Every day | ORAL | 2 refills | Status: AC

## 2023-10-16 MED ORDER — AMLODIPINE BESYLATE 2.5 MG PO TABS: ORAL_TABLET | ORAL | 2 refills | Status: AC

## 2023-10-16 MED ORDER — HYDROCHLOROTHIAZIDE 12.5 MG PO TABS: 12.5000 mg | ORAL_TABLET | Freq: Every day | ORAL | 2 refills | Status: AC | PRN

## 2023-10-16 MED ORDER — DULOXETINE HCL 30 MG PO CPEP: 30.0000 mg | ORAL_CAPSULE | Freq: Every day | ORAL | 2 refills | Status: AC

## 2023-10-16 MED ORDER — RABEPRAZOLE SODIUM 20 MG PO TBEC: 20.0000 mg | DELAYED_RELEASE_TABLET | Freq: Every day | ORAL | 2 refills | Status: AC

## 2023-10-16 MED ORDER — LOSARTAN POTASSIUM 50 MG PO TABS: 50.0000 mg | ORAL_TABLET | Freq: Every day | ORAL | 2 refills | Status: AC

## 2023-10-16 NOTE — Addendum Note (Signed)
 Addended by: Julious Payer D on: 10/16/2023 10:56 AM   Modules accepted: Orders

## 2023-10-17 ENCOUNTER — Other Ambulatory Visit: Payer: Self-pay | Admitting: Family Medicine

## 2023-10-23 ENCOUNTER — Telehealth: Payer: Self-pay

## 2023-10-23 NOTE — Telephone Encounter (Signed)
 Pt informed it was for pain. Pt understands.

## 2023-10-23 NOTE — Telephone Encounter (Signed)
 Copied from CRM [id number]. Topic: Clinical - Prescription Issue >> Oct 23, 2023 11:02 AM Priscille Loveless wrote: Reason for CRM: DULoxetine (CYMBALTA) 30 MG capsule  Pt doesn't know why she was prescribed this medicine, could you please call and advise her. Thank you!

## 2024-03-03 ENCOUNTER — Encounter: Payer: Self-pay | Admitting: Family Medicine

## 2024-03-03 ENCOUNTER — Ambulatory Visit: Payer: Medicare HMO | Admitting: Family Medicine

## 2024-03-03 VITALS — BP 134/73 | HR 80 | Temp 98.3°F | Ht 63.0 in | Wt 176.0 lb

## 2024-03-03 DIAGNOSIS — Z0001 Encounter for general adult medical examination with abnormal findings: Secondary | ICD-10-CM

## 2024-03-03 DIAGNOSIS — G479 Sleep disorder, unspecified: Secondary | ICD-10-CM | POA: Diagnosis not present

## 2024-03-03 DIAGNOSIS — N1832 Chronic kidney disease, stage 3b: Secondary | ICD-10-CM | POA: Diagnosis not present

## 2024-03-03 DIAGNOSIS — R5382 Chronic fatigue, unspecified: Secondary | ICD-10-CM

## 2024-03-03 DIAGNOSIS — I10 Essential (primary) hypertension: Secondary | ICD-10-CM | POA: Diagnosis not present

## 2024-03-03 DIAGNOSIS — M8580 Other specified disorders of bone density and structure, unspecified site: Secondary | ICD-10-CM | POA: Diagnosis not present

## 2024-03-03 DIAGNOSIS — Z0000 Encounter for general adult medical examination without abnormal findings: Secondary | ICD-10-CM

## 2024-03-03 DIAGNOSIS — E7800 Pure hypercholesterolemia, unspecified: Secondary | ICD-10-CM

## 2024-03-03 LAB — LIPID PANEL

## 2024-03-03 NOTE — Patient Instructions (Signed)
 Fatigue If you have fatigue, you feel tired all the time and have a lack of energy or a lack of motivation. Fatigue may make it difficult to start or complete tasks because of exhaustion. Occasional or mild fatigue is often a normal response to activity or life. However, long-term (chronic) or extreme fatigue may be a symptom of a medical condition such as: Depression. Not having enough red blood cells or hemoglobin in the blood (anemia). A problem with a small gland located in the lower front part of the neck (thyroid disorder). Rheumatologic conditions. These are problems related to the body's defense system (immune system). Infections, especially certain viral infections. Fatigue can also lead to negative health outcomes over time. Follow these instructions at home: Medicines Take over-the-counter and prescription medicines only as told by your health care provider. Take a multivitamin if told by your health care provider. Do not use herbal or dietary supplements unless they are approved by your health care provider. Eating and drinking  Avoid heavy meals in the evening. Eat a well-balanced diet, which includes lean proteins, whole grains, plenty of fruits and vegetables, and low-fat dairy products. Avoid eating or drinking too many products with caffeine in them. Avoid alcohol. Drink enough fluid to keep your urine pale yellow. Activity  Exercise regularly, as told by your health care provider. Use or practice techniques to help you relax, such as yoga, tai chi, meditation, or massage therapy. Lifestyle Change situations that cause you stress. Try to keep your work and personal schedules in balance. Do not use recreational or illegal drugs. General instructions Monitor your fatigue for any changes. Go to bed and get up at the same time every day. Avoid fatigue by pacing yourself during the day and getting enough sleep at night. Maintain a healthy weight. Contact a health care  provider if: Your fatigue does not get better. You have a fever. You suddenly lose or gain weight. You have headaches. You have trouble falling asleep or sleeping through the night. You feel angry, guilty, anxious, or sad. You have swelling in your legs or another part of your body. Get help right away if: You feel confused, feel like you might faint, or faint. Your vision is blurry or you have a severe headache. You have severe pain in your abdomen, your back, or the area between your waist and hips (pelvis). You have chest pain, shortness of breath, or an irregular or fast heartbeat. You are unable to urinate, or you urinate less than normal. You have abnormal bleeding from the rectum, nose, lungs, nipples, or, if you are female, the vagina. You vomit blood. You have thoughts about hurting yourself or others. These symptoms may be an emergency. Get help right away. Call 911. Do not wait to see if the symptoms will go away. Do not drive yourself to the hospital. Get help right away if you feel like you may hurt yourself or others, or have thoughts about taking your own life. Go to your nearest emergency room or: Call 911. Call the National Suicide Prevention Lifeline at (262)721-8699 or 988. This is open 24 hours a day. Text the Crisis Text Line at 8450584327. Summary If you have fatigue, you feel tired all the time and have a lack of energy or a lack of motivation. Fatigue may make it difficult to start or complete tasks because of exhaustion. Long-term (chronic) or extreme fatigue may be a symptom of a medical condition. Exercise regularly, as told by your health care provider.  Change situations that cause you stress. Try to keep your work and personal schedules in balance. This information is not intended to replace advice given to you by your health care provider. Make sure you discuss any questions you have with your health care provider. Document Revised: 05/21/2021 Document  Reviewed: 05/21/2021 Elsevier Patient Education  2024 [employer].

## 2024-03-03 NOTE — Progress Notes (Addendum)
 Wendy Barajas is a 83 y.o. female presents to office today for annual physical exam examination.    Concerns today include: 1.  Sleep difficulties She reports that she does not have any problems falling asleep but does have difficulty staying asleep.  Often she will wake up after only 3 to 4 hours asleep and then finds it difficult to go back to bed unless she takes a hot shower.  She denies any excessive caffeine intake and only drinks 1 coffee per day in the morning.  She does occasionally nap and this can be an hour or so each time.  She notes she easily falls asleep on the couch when she is watching TV.  She does not keep a schedule.  She tried melatonin but that was not helpful.  She reports good sleep hygiene and avoids any blue light activities prior to bed.  Denies any apneic symptoms including snoring, gasping for breath but she notes that she resides alone with her cat and so there is no one directly observing her sleep.  She does report chronic fatigue as a result.  Denies any blood loss.  No straight biting etc.  Occupation: retired, Marital status: single, Substance use: none Health Maintenance Due  Topic Date Due   DEXA SCAN  10/31/2019   Medicare Annual Wellness (AWV)  03/12/2024   Refills needed today: none  Immunization History  Administered Date(s) Administered   Influenza, High Dose Seasonal PF 06/26/2017   Influenza,inj,Quad PF,6+ Mos 06/20/2015   Tdap 02/12/2019   Past Medical History:  Diagnosis Date   Allergy    Arthritis    bone spurs - bilateral knee issues    Encounter for screening colonoscopy 02/27/2017   Overview:   [facility]. Dr Debarah. No recall colon due to age and absence of adenomas.     GERD (gastroesophageal reflux disease)    Hyperlipidemia    Hypertension    Social History   Socioeconomic History   Marital status: Widowed    Spouse name: Not on file   Number of children: 1   Years of education: Not on file   Highest education level: 11th grade   Occupational History   Occupation: Retired   Tobacco Use   Smoking status: Never   Smokeless tobacco: Never  Vaping Use   Vaping status: Never Used  Substance and Sexual Activity   Alcohol use: No    Alcohol/week: 0.0 standard drinks of alcohol   Drug use: No   Sexual activity: Not Currently  Other Topics Concern   Not on file  Social History Narrative   Live alone; Her son lives nearby   Social Drivers of Corporate investment banker Strain: Low Risk  (03/13/2023)   Overall Financial Resource Strain (CARDIA)    Difficulty of Paying Living Expenses: Not hard at all  Food Insecurity: No Food Insecurity (03/13/2023)   Hunger Vital Sign    Worried About Running Out of Food in the Last Year: Never true    Ran Out of Food in the Last Year: Never true  Transportation Needs: No Transportation Needs (03/13/2023)   PRAPARE - Administrator, Civil Service (Medical): No    Lack of Transportation (Non-Medical): No  Physical Activity: Insufficiently Active (03/13/2023)   Exercise Vital Sign    Days of Exercise per Week: 4 days    Minutes of Exercise per Session: 10 min  Stress: No Stress Concern Present (03/13/2023)   [employer] of Occupational Health -  Occupational Stress Questionnaire    Feeling of Stress : Not at all  Social Connections: Moderately Isolated (03/13/2023)   Social Connection and Isolation Panel    Frequency of Communication with Friends and Family: More than three times a week    Frequency of Social Gatherings with Friends and Family: More than three times a week    Attends Religious Services: More than 4 times per year    Active Member of [employer] or Organizations: No    Attends Banker Meetings: Never    Marital Status: Widowed  Intimate Partner Violence: Not At Risk (03/13/2023)   Humiliation, Afraid, Rape, and Kick questionnaire    Fear of Current or Ex-Partner: No    Emotionally Abused: No    Physically Abused: No    Sexually Abused: No    Past Surgical History:  Procedure Laterality Date   ABDOMINAL HYSTERECTOMY     COLONOSCOPY     EYE SURGERY Bilateral    cataract removal    Family History  Problem Relation Age of Onset   Diabetes Mother    Heart disease Mother    Cancer Father        unsure = thinks colon    Lung cancer Sister        2025   Arthritis Sister        back issues    Cancer Brother        unsure    Stroke Brother     Current Outpatient Medications:    amLODipine  (NORVASC ) 2.5 MG tablet, TAKE 1 TABLET EVERY DAY (DISCONTINUE 5MG  TABLET), Disp: 90 tablet, Rfl: 2   calcium carbonate (OS-CAL - DOSED IN MG OF ELEMENTAL CALCIUM) 1250 (500 Ca) MG tablet, Take 1 tablet by mouth., Disp: , Rfl:    Capsaicin-Menthol-Methyl Sal (CAPSAICIN-METHYL SAL-MENTHOL) 0.025-1-12 % CREA, Apply 1 application topically 4 (four) times daily as needed., Disp: 56.6 g, Rfl: 3   Cholecalciferol (VITAMIN D3) 400 UNITS CAPS, Take 1 capsule by mouth daily. , Disp: , Rfl:    DULoxetine  (CYMBALTA ) 30 MG capsule, Take 1 capsule (30 mg total) by mouth daily. For muscle pain, Disp: 90 capsule, Rfl: 2   fluticasone  (FLONASE ) 50 MCG/ACT nasal spray, Place 2 sprays into both nostrils daily., Disp: 16 g, Rfl: 6   hydrochlorothiazide  (HYDRODIURIL ) 12.5 MG tablet, Take 1 tablet (12.5 mg total) by mouth daily as needed (swelling or high blood pressure above 140/90)., Disp: 90 tablet, Rfl: 2   loratadine  (CLARITIN ) 10 MG tablet, TAKE 1 TABLET EVERY DAY, Disp: 90 tablet, Rfl: 3   losartan  (COZAAR ) 50 MG tablet, Take 1 tablet (50 mg total) by mouth daily., Disp: 90 tablet, Rfl: 2   nystatin  cream (MYCOSTATIN ), APPLY 1 APPLICATION TOPICALLY TWO TIMES DAILY TO THE GROIN AREAS X 7-14 DAYS PER FLARE., Disp: 30 g, Rfl: 0   RABEprazole  (ACIPHEX ) 20 MG tablet, Take 1 tablet (20 mg total) by mouth daily., Disp: 90 tablet, Rfl: 2   simvastatin  (ZOCOR ) 20 MG tablet, Take 1 tablet (20 mg total) by mouth daily., Disp: 90 tablet, Rfl: 2   triamcinolone  cream  (KENALOG ) 0.1 %, Apply 1 Application topically 2 (two) times daily as needed (itching from bug bites)., Disp: 30 g, Rfl: 0  Allergies  Allergen Reactions   Pollen Extract      ROS: Review of Systems Pertinent items noted in HPI and remainder of comprehensive ROS otherwise negative.    Physical exam BP 134/73   Pulse 80   Temp  98.3 F (36.8 C)   Ht 5' 3 (1.6 m)   Wt 176 lb (79.8 kg)   SpO2 96%   BMI 31.18 kg/m  General appearance: alert, cooperative, appears stated age, no distress, and mildly obese Head: Normocephalic, without obvious abnormality, atraumatic Eyes: negative findings: lids and lashes normal, conjunctivae and sclerae normal, corneas clear, and pupils equal, round, reactive to light and accomodation Ears: normal TM's and external ear canals both ears Nose: Nares normal. Septum midline. Mucosa normal. No drainage or sinus tenderness. Throat: lips, mucosa, and tongue normal; teeth and gums normal Neck: no adenopathy, no carotid bruit, supple, symmetrical, trachea midline, and thyroid  not enlarged, symmetric, no tenderness/mass/nodules Back: symmetric, no curvature. ROM normal. No CVA tenderness. Lungs: clear to auscultation bilaterally Heart: regular rate and rhythm, S1, S2 normal, no murmur, click, rub or gallop Abdomen: soft, non-tender; bowel sounds normal; no masses,  no organomegaly Extremities: extremities normal, atraumatic, no cyanosis or edema Pulses: 2+ and symmetric Skin: Skin color, texture, turgor normal. No rashes or lesions Lymph nodes: Cervical, supraclavicular, and axillary nodes normal. Neurologic: Grossly normal      03/03/2024   10:31 AM 08/15/2023    9:40 AM 03/13/2023    8:34 AM  Depression screen PHQ 2/9  Decreased Interest 0 0 0  Down, Depressed, Hopeless 0 0 0  PHQ - 2 Score 0 0 0  Altered sleeping 0    Tired, decreased energy 0    Change in appetite 0    Feeling bad or failure about yourself  0    Trouble concentrating 0    Moving  slowly or fidgety/restless 0    Suicidal thoughts 0    PHQ-9 Score 0    Difficult doing work/chores Not difficult at all        03/03/2024   10:31 AM 08/15/2023    9:40 AM 12/11/2022   10:28 AM 06/04/2022   11:11 AM  GAD 7 : Generalized Anxiety Score  Nervous, Anxious, on Edge 0 0 0 0  Control/stop worrying 0 0 0 0  Worry too much - different things 0 0 0 0  Trouble relaxing 0 0 0 0  Restless 0 0 0 0  Easily annoyed or irritable 0 0 0 0  Afraid - awful might happen 0 0 0 0  Total GAD 7 Score 0 0 0 0  Anxiety Difficulty Not difficult at all Not difficult at all Not difficult at all Not difficult at all     Assessment/ Plan: Lizvette Osmond here for annual physical exam.   Annual physical exam  Essential hypertension - Plan: CMP14+EGFR  Chronic kidney disease, stage 3b (HCC) - Plan: CMP14+EGFR  Pure hypercholesterolemia - Plan: CMP14+EGFR, Lipid Panel, CANCELED: TSH  Osteopenia with high risk of fracture - Plan: DG [facility] DEXA  Sleep difficulties  Chronic fatigue - Plan: Anemia Profile B, TSH + free T4  Check renal function given known CKD 3B.  Blood pressure is controlled upon recheck today.  No changes  I did offer sleep aid but she declined this today.  We talked about naturopathic ways to improve sleep and she is going to try some of these first  Check thyroid  levels given chronic fatigue, anemia profile and fasting lipid panel.  She will have DEXA scan done today given osteopenia with high risk of fracture.  Had vitamin D  done earlier this year.  This was not repeated today  Counseled on healthy lifestyle choices, including diet (rich in fruits, vegetables and lean  meats and low in salt and simple carbohydrates) and exercise (at least 30 minutes of moderate physical activity daily).  Patient to follow up 69m  Xavi Tomasik M. Jolinda, DO

## 2024-03-04 LAB — CMP14+EGFR
ALT: 9 IU/L (ref 0–32)
AST: 17 IU/L (ref 0–40)
Albumin: 4.5 g/dL (ref 3.7–4.7)
Alkaline Phosphatase: 109 IU/L (ref 44–121)
BUN/Creatinine Ratio: 16 (ref 12–28)
BUN: 23 mg/dL (ref 8–27)
Bilirubin Total: 0.9 mg/dL (ref 0.0–1.2)
CO2: 22 mmol/L (ref 20–29)
Calcium: 10 mg/dL (ref 8.7–10.3)
Chloride: 103 mmol/L (ref 96–106)
Creatinine, Ser: 1.43 mg/dL — AB (ref 0.57–1.00)
Globulin, Total: 3.1 g/dL (ref 1.5–4.5)
Glucose: 106 mg/dL — AB (ref 70–99)
Potassium: 4.5 mmol/L (ref 3.5–5.2)
Sodium: 142 mmol/L (ref 134–144)
Total Protein: 7.6 g/dL (ref 6.0–8.5)
eGFR: 36 mL/min/1.73 — AB (ref >59)

## 2024-03-04 LAB — ANEMIA PROFILE B
Basophils Absolute: 0.1 x10E3/uL (ref 0.0–0.2)
Basos: 1 %
EOS (ABSOLUTE): 0.3 x10E3/uL (ref 0.0–0.4)
Eos: 3 %
Ferritin: 28 ng/mL (ref 15–150)
Folate: 14.4 ng/mL
Hematocrit: 38.4 % (ref 34.0–46.6)
Hemoglobin: 12.4 g/dL (ref 11.1–15.9)
Immature Grans (Abs): 0 x10E3/uL (ref 0.0–0.1)
Immature Granulocytes: 0 %
Iron Saturation: 18 % (ref 15–55)
Iron: 69 ug/dL (ref 27–139)
Lymphocytes Absolute: 2.3 x10E3/uL (ref 0.7–3.1)
Lymphs: 27 %
MCH: 29.7 pg (ref 26.6–33.0)
MCHC: 32.3 g/dL (ref 31.5–35.7)
MCV: 92 fL (ref 79–97)
Monocytes Absolute: 0.5 x10E3/uL (ref 0.1–0.9)
Monocytes: 6 %
Neutrophils Absolute: 5.2 x10E3/uL (ref 1.4–7.0)
Neutrophils: 63 %
Platelets: 309 x10E3/uL (ref 150–450)
RBC: 4.17 x10E6/uL (ref 3.77–5.28)
RDW: 13.4 % (ref 11.7–15.4)
Retic Ct Pct: 1.5 % (ref 0.6–2.6)
Total Iron Binding Capacity: 380 ug/dL (ref 250–450)
UIBC: 311 ug/dL (ref 118–369)
Vitamin B-12: 690 pg/mL (ref 232–1245)
WBC: 8.4 x10E3/uL (ref 3.4–10.8)

## 2024-03-04 LAB — TSH+FREE T4
Free T4: 1.14 ng/dL (ref 0.82–1.77)
TSH: 2.33 u[IU]/mL (ref 0.450–4.500)

## 2024-03-04 LAB — LIPID PANEL
Cholesterol, Total: 173 mg/dL (ref 100–199)
HDL: 45 mg/dL (ref >39)
LDL CALC COMMENT:: 3.8 ratio (ref 0.0–4.4)
LDL Chol Calc (NIH): 100 mg/dL — AB (ref 0–99)
Triglycerides: 160 mg/dL — AB (ref 0–149)
VLDL Cholesterol Cal: 28 mg/dL (ref 5–40)

## 2024-03-05 ENCOUNTER — Ambulatory Visit: Payer: Self-pay | Admitting: Family Medicine

## 2024-03-08 ENCOUNTER — Ambulatory Visit: Payer: Self-pay

## 2024-03-08 DIAGNOSIS — Z91038 Other insect allergy status: Secondary | ICD-10-CM

## 2024-03-08 NOTE — Telephone Encounter (Addendum)
 FYI Only or Action Required?: Action required by provider: would like something called in for the itching.  Patient was last seen in primary care on 03/03/2024 by [name], DO.  Called Nurse Triage reporting Insect Bite.  Symptoms began several days ago.  Interventions attempted: OTC medications: has tried everything and nothing is working.  Symptoms are: unchanged.  Triage Disposition: Home Care, Call PCP Within 24 Hours  Patient/caregiver understands and will follow disposition?: Yes  First attempt; no answer   Copied from CRM [id number]. Topic: Clinical - Medical Advice >> Mar 08, 2024  9:30 AM Graeme ORN wrote: Reason for CRM: Patient called. States she was bit by mosquitoes. Has lots of bites. Has tried everything over the counter and nothing is working for the itching. Would like provider to send something in. Thank You

## 2024-03-09 ENCOUNTER — Other Ambulatory Visit: Payer: Self-pay | Admitting: Family Medicine

## 2024-03-09 DIAGNOSIS — Z91038 Other insect allergy status: Secondary | ICD-10-CM

## 2024-03-09 MED ORDER — TRIAMCINOLONE ACETONIDE 0.1 % EX CREA: 1.0000 | TOPICAL_CREAM | Freq: Two times a day (BID) | CUTANEOUS | 0 refills | Status: DC | PRN

## 2024-03-09 MED ORDER — TRIAMCINOLONE ACETONIDE 0.1 % EX CREA
1.0000 | TOPICAL_CREAM | Freq: Two times a day (BID) | CUTANEOUS | 0 refills | Status: DC | PRN
Start: 2024-03-09 — End: 2024-03-09

## 2024-03-09 NOTE — Telephone Encounter (Unsigned)
 Copied from CRM [id number]. Topic: Clinical - Prescription Issue >> Mar 09, 2024 10:16 AM Sophia H wrote: Reason for CRM: Patient states she went to pharmacy to pick up steroid cream and it has not yet been received by the pharmacy. Please advise, looks like it was ordered but I am not 100% sure.  # [phone]   [id number] [place], [place] - [address]

## 2024-03-09 NOTE — Addendum Note (Signed)
 Addended by: [name] on: 03/09/2024 07:12 AM   Modules accepted: Orders

## 2024-03-09 NOTE — Telephone Encounter (Signed)
 Topical steroid sent. NTBS if persistent issues

## 2024-03-09 NOTE — Telephone Encounter (Signed)
Pt made aware. She has no further concerns.

## 2024-03-09 NOTE — Telephone Encounter (Signed)
 1st refill states that Rx was phoned in. Rx printed and signed by Jolinda [facility]

## 2024-04-28 ENCOUNTER — Ambulatory Visit (INDEPENDENT_AMBULATORY_CARE_PROVIDER_SITE_OTHER)

## 2024-04-28 DIAGNOSIS — M8588 Other specified disorders of bone density and structure, other site: Secondary | ICD-10-CM

## 2024-05-03 ENCOUNTER — Other Ambulatory Visit: Payer: Self-pay | Admitting: Family Medicine

## 2024-05-03 ENCOUNTER — Telehealth: Payer: Self-pay | Admitting: Family Medicine

## 2024-05-03 DIAGNOSIS — J301 Allergic rhinitis due to pollen: Secondary | ICD-10-CM

## 2024-05-03 DIAGNOSIS — N1832 Chronic kidney disease, stage 3b: Secondary | ICD-10-CM

## 2024-05-03 DIAGNOSIS — M8580 Other specified disorders of bone density and structure, unspecified site: Secondary | ICD-10-CM

## 2024-05-03 MED ORDER — DESLORATADINE 5 MG PO TABS: 5.0000 mg | ORAL_TABLET | Freq: Every day | ORAL | 4 refills | Status: DC

## 2024-05-03 NOTE — Telephone Encounter (Signed)
 She has CKD 3B so loratadine  is the safest and so is the Clarinex .  I will send in Clarinex  but it may be cost prohibitive.  If she finds that it is too costly, best to just get loratadine  OTC from a local pharmacy for like $10.

## 2024-05-03 NOTE — Telephone Encounter (Signed)
 Copied from CRM #[id number]. Topic: Clinical - Prescription Issue >> May 03, 2024  1:40 PM Dominique E wrote: Pt called to report that her pharmacist told her that they will no longer have  loratadine  (CLARITIN ) 10 MG tablet And that she needs to ask her PCP to prescribe an alternative.   [facility] - [place], [place] - [address] [place] [address] Phone: [phone] Fax: [phone]

## 2024-05-03 NOTE — Telephone Encounter (Signed)
 Name from pharmacy: DESLORATADINE  5 MG TABLET  Pharmacy comment: Alternative Requested:CKD STAGE 3B - RENAL DOSE ADJUSMENT 5MG  EVERY OTHER DAY. PLEASE CONSIDER SENDING NEW RX, THANKS!

## 2024-05-04 NOTE — Telephone Encounter (Signed)
 Contacted patient. Notified patient. Patient verbalized understanding.

## 2024-05-05 ENCOUNTER — Other Ambulatory Visit

## 2024-05-05 DIAGNOSIS — M8580 Other specified disorders of bone density and structure, unspecified site: Secondary | ICD-10-CM

## 2024-05-06 LAB — VITAMIN D 25 HYDROXY (VIT D DEFICIENCY, FRACTURES): Vit D, 25-Hydroxy: 29.8 ng/mL — ABNORMAL LOW (ref 30.0–100.0)

## 2024-05-07 ENCOUNTER — Ambulatory Visit: Payer: Self-pay | Admitting: Family Medicine

## 2024-06-07 ENCOUNTER — Ambulatory Visit (INDEPENDENT_AMBULATORY_CARE_PROVIDER_SITE_OTHER)

## 2024-06-07 ENCOUNTER — Telehealth: Payer: Self-pay

## 2024-06-07 VITALS — BP 158/79 | HR 86 | Temp 97.5°F | Ht 63.0 in | Wt 177.4 lb

## 2024-06-07 DIAGNOSIS — Z0000 Encounter for general adult medical examination without abnormal findings: Secondary | ICD-10-CM | POA: Diagnosis not present

## 2024-06-07 NOTE — Telephone Encounter (Signed)
 Patient needs an appointment

## 2024-06-07 NOTE — Patient Instructions (Signed)
 Ms. Geng,  Thank you for taking the time for your Medicare Wellness Visit. I appreciate your continued commitment to your health goals. Please review the care plan we discussed, and feel free to reach out if I can assist you further.  Medicare recommends these wellness visits once per year to help you and your care team stay ahead of potential health issues. These visits are designed to focus on prevention, allowing your provider to concentrate on managing your acute and chronic conditions during your regular appointments.  Please note that Annual Wellness Visits do not include a physical exam. Some assessments may be limited, especially if the visit was conducted virtually. If needed, we may recommend a separate in-person follow-up with your provider.  Ongoing Care Seeing your primary care provider every 3 to 6 months helps us  monitor your health and provide consistent, personalized care.   Referrals If a referral was made during today's visit and you haven't received any updates within two weeks, please contact the referred provider directly to check on the status.  Recommended Screenings:  Health Maintenance  Topic Date Due   Zoster (Shingles) Vaccine (1 of 2) Never done   Flu Shot  03/12/2024   Medicare Annual Wellness Visit  03/12/2024   COVID-19 Vaccine (1 - 2025-26 season) Never done   Hepatitis C Screening  08/14/2024*   Pneumococcal Vaccine for age over 70 (1 of 2 - PCV) 03/03/2025*   DEXA scan (bone density measurement)  04/30/2026   DTaP/Tdap/Td vaccine (2 - Td or Tdap) 02/11/2029   Meningitis B Vaccine  Aged Out  *Topic was postponed. The date shown is not the original due date.       06/07/2024   10:41 AM  Advanced Directives  Does Patient Have a Medical Advance Directive? Yes  Type of Advance Directive Living will;Healthcare Power of Attorney  Copy of Healthcare Power of Attorney in Chart? No - copy requested   Advance Care Planning is important because  it: Ensures you receive medical care that aligns with your values, goals, and preferences. Provides guidance to your family and loved ones, reducing the emotional burden of decision-making during critical moments.  Vision: Annual vision screenings are recommended for early detection of glaucoma, cataracts, and diabetic retinopathy. These exams can also reveal signs of chronic conditions such as diabetes and high blood pressure.  Dental: Annual dental screenings help detect early signs of oral cancer, gum disease, and other conditions linked to overall health, including heart disease and diabetes.  Please see the attached documents for additional preventive care recommendations.

## 2024-06-07 NOTE — Progress Notes (Signed)
 Subjective:   Wendy Barajas is a 83 y.o. who presents for a Medicare Wellness preventive visit.  As a reminder, Annual Wellness Visits don't include a physical exam, and some assessments may be limited, especially if this visit is performed virtually. We may recommend an in-person follow-up visit with your provider if needed.  Visit Complete: In person Persons Participating in Visit: Patient.  AWV Questionnaire: No: Patient Medicare AWV questionnaire was not completed prior to this visit.  Cardiac Risk Factors include: advanced age (>80men, >43 women);obesity (BMI >30kg/m2)     Objective:    Today's Vitals   06/07/24 1032  BP: (!) 158/79  Pulse: 86  Temp: (!) 97.5 F (36.4 C)  TempSrc: Temporal  Weight: 177 lb 6.4 oz (80.5 kg)  Height: 5' 3 (1.6 m)   Body mass index is 31.42 kg/m.     06/07/2024   10:41 AM 03/13/2023    8:35 AM 06/18/2021   12:32 PM 03/06/2021    3:50 PM 12/09/2019    3:01 PM 12/08/2018    3:25 PM 10/30/2017   10:44 AM  Advanced Directives  Does Patient Have a Medical Advance Directive? Yes No No Yes No No No   Type of Advance Directive Living will;Healthcare Power of [employer] Power of Attorney     Copy of Healthcare Power of Attorney in Chart? No - copy requested   No - copy requested     Would patient like information on creating a medical advance directive?  Yes (MAU/Ambulatory/Procedural Areas - Information given)   No - Patient declined No - Patient declined  Yes (ED - Information included in AVS)      Data saved with a previous flowsheet row definition    Current Medications (verified) Outpatient Encounter Medications as of 06/07/2024  Medication Sig   amLODipine  (NORVASC ) 2.5 MG tablet TAKE 1 TABLET EVERY DAY (DISCONTINUE 5MG  TABLET)   calcium carbonate (OS-CAL - DOSED IN MG OF ELEMENTAL CALCIUM) 1250 (500 Ca) MG tablet Take 1 tablet by mouth.   Cholecalciferol (VITAMIN D3) 400 UNITS CAPS Take 1 capsule by mouth daily.     desloratadine  (CLARINEX ) 5 MG tablet Take 1 tablet (5 mg total) by mouth every other day. To replace loratidine   DULoxetine  (CYMBALTA ) 30 MG capsule Take 1 capsule (30 mg total) by mouth daily. For muscle pain   hydrochlorothiazide  (HYDRODIURIL ) 12.5 MG tablet Take 1 tablet (12.5 mg total) by mouth daily as needed (swelling or high blood pressure above 140/90).   losartan  (COZAAR ) 50 MG tablet Take 1 tablet (50 mg total) by mouth daily.   RABEprazole  (ACIPHEX ) 20 MG tablet Take 1 tablet (20 mg total) by mouth daily.   simvastatin  (ZOCOR ) 20 MG tablet Take 1 tablet (20 mg total) by mouth daily.   triamcinolone  cream (KENALOG ) 0.1 % Apply 1 Application topically 2 (two) times daily as needed (itching/ rash (max 7 day use)).   No facility-administered encounter medications on file as of 06/07/2024.    Allergies (verified) Pollen extract   History: Past Medical History:  Diagnosis Date   Allergy    Arthritis    bone spurs - bilateral knee issues    Encounter for screening colonoscopy 02/27/2017   Overview:   [facility]. Dr Debarah. No recall colon due to age and absence of adenomas.     GERD (gastroesophageal reflux disease)    Hyperlipidemia    Hypertension    Past Surgical History:  Procedure Laterality Date   ABDOMINAL HYSTERECTOMY  COLONOSCOPY     EYE SURGERY Bilateral    cataract removal    Family History  Problem Relation Age of Onset   Diabetes Mother    Heart disease Mother    Cancer Father        unsure = thinks colon    Lung cancer Sister        2025   Arthritis Sister        back issues    Cancer Brother        unsure    Stroke Brother    Social History   Socioeconomic History   Marital status: Widowed    Spouse name: Not on file   Number of children: 1   Years of education: Not on file   Highest education level: 11th grade  Occupational History   Occupation: Retired   Tobacco Use   Smoking status: Never   Smokeless tobacco: Never  Vaping Use   Vaping  status: Never Used  Substance and Sexual Activity   Alcohol use: No    Alcohol/week: 0.0 standard drinks of alcohol   Drug use: No   Sexual activity: Not Currently  Other Topics Concern   Not on file  Social History Narrative   Live alone; Her son lives nearby   Social Drivers of Corporate Investment Banker Strain: Low Risk  (03/13/2023)   Overall Financial Resource Strain (CARDIA)    Difficulty of Paying Living Expenses: Not hard at all  Food Insecurity: No Food Insecurity (03/13/2023)   Hunger Vital Sign    Worried About Running Out of Food in the Last Year: Never true    Ran Out of Food in the Last Year: Never true  Transportation Needs: No Transportation Needs (03/13/2023)   PRAPARE - Administrator, Civil Service (Medical): No    Lack of Transportation (Non-Medical): No  Physical Activity: Insufficiently Active (03/13/2023)   Exercise Vital Sign    Days of Exercise per Week: 4 days    Minutes of Exercise per Session: 10 min  Stress: No Stress Concern Present (03/13/2023)   [employer] of Occupational Health - Occupational Stress Questionnaire    Feeling of Stress : Not at all  Social Connections: Moderately Isolated (03/13/2023)   Social Connection and Isolation Panel    Frequency of Communication with Friends and Family: More than three times a week    Frequency of Social Gatherings with Friends and Family: More than three times a week    Attends Religious Services: More than 4 times per year    Active Member of [employer] or Organizations: No    Attends Banker Meetings: Never    Marital Status: Widowed    Tobacco Counseling Counseling given: Yes    Clinical Intake:  Pre-visit preparation completed: Yes  Pain : No/denies pain     BMI - recorded: 31.42 Nutritional Status: BMI > 30  Obese Nutritional Risks: None Diabetes: No  Lab Results  Component Value Date   HGBA1C 6.0 02/07/2017     How often do you need to have someone help you  when you read instructions, pamphlets, or other written materials from your doctor or pharmacy?: 1 - Never  Interpreter Needed?: No  Information entered by :: alia t/cma   Activities of Daily Living     06/07/2024   10:39 AM  In your present state of health, do you have any difficulty performing the following activities:  Hearing? 0  Vision? 0  Difficulty concentrating or making decisions? 0  Walking or climbing stairs? 0  Dressing or bathing? 0  Doing errands, shopping? 0  Preparing Food and eating ? N  Using the Toilet? N  In the past six months, have you accidently leaked urine? N  Do you have problems with loss of bowel control? N  Managing your Medications? N  Managing your Finances? N  Housekeeping or managing your Housekeeping? N    Patient Care Team: [name], DO as PCP - General (Family Medicine) Jerrye Katheryn BROCKS, MD as Consulting Physician (Nephrology)  I have updated your Care Teams any recent Medical Services you may have received from other providers in the past year.     Assessment:   This is a routine wellness examination for Tracy.  Hearing/Vision screen Hearing Screening - Comments:: Pt denies hearing dif Vision Screening - Comments:: Pt wear glasses/[facility] in [place], [place]/last ov 2024   Goals Addressed             This Visit's Progress    Prevent falls   On track      Depression Screen     03/03/2024   10:31 AM 08/15/2023    9:40 AM 03/13/2023    8:34 AM 12/11/2022   10:28 AM 06/04/2022   11:11 AM 03/07/2022    3:21 PM 10/24/2021   10:13 AM  PHQ 2/9 Scores  PHQ - 2 Score 0 0 0 0 0 2 0  PHQ- 9 Score 0   0  3 1    Fall Risk     06/07/2024   10:33 AM 03/03/2024   10:31 AM 03/13/2023    8:33 AM 06/04/2022   11:11 AM 03/07/2022    3:19 PM  Fall Risk   Falls in the past year? 0 0 0 0 0  Number falls in past yr: 0 0 0  0  Injury with Fall? 0 0 0  0  Risk for fall due to : No Fall Risks No Fall Risks No Fall Risks  No Fall Risks   Follow up Falls evaluation completed Falls evaluation completed Falls prevention discussed  Falls prevention discussed      Data saved with a previous flowsheet row definition    MEDICARE RISK AT HOME:  Medicare Risk at Home Any stairs in or around the home?: No If so, are there any without handrails?: No Home free of loose throw rugs in walkways, pet beds, electrical cords, etc?: Yes Adequate lighting in your home to reduce risk of falls?: Yes Life alert?: No Use of a cane, walker or w/c?: Yes Grab bars in the bathroom?: No Shower chair or bench in shower?: No Elevated toilet seat or a handicapped toilet?: No  TIMED UP AND GO:  Was the test performed?  no  Cognitive Function: 6CIT completed    04/26/2021    2:27 PM 10/30/2017   10:46 AM  MMSE - Mini Mental State Exam  Orientation to time 5 5  Orientation to Place 5 5  Registration 3 3  Attention/ Calculation 5 5  Recall 2 3  Language- name 2 objects 2 2  Language- repeat 1 1  Language- follow 3 step command 3 3  Language- read & follow direction 1 1  Write a sentence 1 1  Copy design 1 1  Total score 29 30        06/07/2024   10:44 AM 03/13/2023    8:35 AM 03/07/2022    3:21 PM  12/09/2019    3:03 PM 12/08/2018    3:28 PM  6CIT Screen  What Year? 0 points 0 points 0 points 0 points 0 points  What month? 0 points 0 points 0 points 0 points 0 points  What time? 0 points 0 points 0 points 0 points 0 points  Count back from 20 0 points 0 points 0 points 0 points 0 points  Months in reverse 4 points 0 points 2 points 0 points 2 points  Repeat phrase 6 points 0 points 0 points 0 points 0 points  Total Score 10 points 0 points 2 points 0 points 2 points    Immunizations Immunization History  Administered Date(s) Administered   INFLUENZA, HIGH DOSE SEASONAL PF 06/26/2017   Influenza,inj,Quad PF,6+ Mos 06/20/2015   Tdap 02/12/2019    Screening Tests Health Maintenance  Topic Date Due   Zoster Vaccines- Shingrix  (1 of 2) Never done   Influenza Vaccine  03/12/2024   COVID-19 Vaccine (1 - 2025-26 season) Never done   Hepatitis C Screening  08/14/2024 (Originally 08/15/2023)   Pneumococcal Vaccine: 50+ Years (1 of 2 - PCV) 03/03/2025 (Originally 07/05/1960)   Medicare Annual Wellness (AWV)  06/07/2025   DEXA SCAN  04/30/2026   DTaP/Tdap/Td (2 - Td or Tdap) 02/11/2029   Meningococcal B Vaccine  Aged Out    Health Maintenance Items Addressed: See Nurse Notes at the end of this note  Additional Screening:  Vision Screening: Recommended annual ophthalmology exams for early detection of glaucoma and other disorders of the eye. Is the patient up to date with their annual eye exam?  Yes  Who is the provider or what is the name of the office in which the patient attends annual eye exams? [facility] in [facility]  Dental Screening: Recommended annual dental exams for proper oral hygiene  Community Resource Referral / Chronic Care Management: CRR required this visit?  No   CCM required this visit?  No   Plan:    I have personally reviewed and noted the following in the patient's chart:   Medical and social history Use of alcohol, tobacco or illicit drugs  Current medications and supplements including opioid prescriptions. Patient is not currently taking opioid prescriptions. Functional ability and status Nutritional status Physical activity Advanced directives List of other physicians Hospitalizations, surgeries, and ER visits in previous 12 months Vitals Screenings to include cognitive, depression, and falls Referrals and appointments  In addition, I have reviewed and discussed with patient certain preventive protocols, quality metrics, and best practice recommendations. A written personalized care plan for preventive services as well as general preventive health recommendations were provided to patient.   Ozie Ned, CMA   06/07/2024   After Visit Summary: (MyChart) Due to this being a  telephonic visit, the after visit summary with patients personalized plan was offered to patient via MyChart   Notes: PCP Follow Up Recommendations: Pt is aware and due the following: covid, flu and shingles vaccine-declined per pt in office today.

## 2024-06-07 NOTE — Telephone Encounter (Signed)
 During pt's visit today, pt stated that she has broken out w/red itchy spots on body, especially on her l-leg and inner thighs, some around upper body. Pt stated had them since July, some days are itchier than others. Would like something for the itch.  Pls call pt w/advise

## 2024-06-08 ENCOUNTER — Ambulatory Visit: Admitting: Family Medicine

## 2024-06-09 ENCOUNTER — Ambulatory Visit: Admitting: Family Medicine

## 2024-07-12 ENCOUNTER — Ambulatory Visit: Admitting: Nurse Practitioner

## 2024-07-12 ENCOUNTER — Ambulatory Visit: Payer: Self-pay

## 2024-07-12 ENCOUNTER — Encounter: Payer: Self-pay | Admitting: Nurse Practitioner

## 2024-07-12 VITALS — BP 152/80 | HR 87 | Temp 97.0°F | Ht 63.0 in | Wt 177.0 lb

## 2024-07-12 DIAGNOSIS — R21 Rash and other nonspecific skin eruption: Secondary | ICD-10-CM

## 2024-07-12 MED ORDER — PREDNISONE 20 MG PO TABS: 40.0000 mg | ORAL_TABLET | Freq: Every day | ORAL | 0 refills | Status: AC

## 2024-07-12 MED ORDER — TRIAMCINOLONE ACETONIDE 0.1 % EX CREA: 1.0000 | TOPICAL_CREAM | Freq: Two times a day (BID) | CUTANEOUS | 0 refills | Status: AC | PRN

## 2024-07-12 NOTE — Progress Notes (Signed)
 Subjective:    Patient ID: Wendy Barajas, female    DOB: 06-May-1941, 83 y.o.   MRN: [id number]   Chief Complaint: Rash (Since August)   Rash Pertinent negatives include no eye pain or shortness of breath.    Patient in c/o rash since August. Starts as a red bump, itches and then dries  up. They are popping up all over her body. Not in any type of pattern. She has used alcohol and calamine lotion with no relief. She says she checked her bed for bed bugs and checked her cat for fleas. But had neither. Denies any new soaps or detergents Patient Active Problem List   Diagnosis Date Noted   Osteopenia with high risk of fracture 03/03/2024   Pure hypercholesterolemia 02/29/2020   Chronic kidney disease, stage 3b (HCC) 02/29/2020   Chronic pain of right knee 01/19/2019   DOE (dyspnea on exertion) 12/24/2018   Chronic pain of left knee 08/17/2018   Chronic idiopathic constipation 02/07/2017   Gastroesophageal reflux disease without esophagitis 08/11/2016   Essential hypertension 02/14/2015       Review of Systems  Constitutional:  Negative for diaphoresis.  Eyes:  Negative for pain.  Respiratory:  Negative for shortness of breath.   Cardiovascular:  Negative for chest pain, palpitations and leg swelling.  Gastrointestinal:  Negative for abdominal pain.  Endocrine: Negative for polydipsia.  Skin:  Positive for rash.  Neurological:  Negative for dizziness, weakness and headaches.  Hematological:  Does not bruise/bleed easily.  All other systems reviewed and are negative.      Objective:   Physical Exam Constitutional:      Appearance: Normal appearance.  Cardiovascular:     Rate and Rhythm: Normal rate and regular rhythm.  Pulmonary:     Breath sounds: Normal breath sounds.  Skin:    General: Skin is warm.     Findings: Rash (see attached picture) present.  Neurological:     General: No focal deficit present.     Mental Status: She is alert and oriented to person,  place, and time.  Psychiatric:        Mood and Affect: Mood normal.        Behavior: Behavior normal.    BP (!) 152/80   Pulse 87   Temp (!) 97 F (36.1 C) (Temporal)   Ht 5' 3 (1.6 m)   Wt 177 lb (80.3 kg)   SpO2 98%   BMI 31.35 kg/m         Assessment & Plan:   Wendy Barajas in today with chief complaint of Rash (Since August)   1. Rash (Primary) Avoid scratching Zyrtec  OTC  Cool compresses as needed Mix triamcinolone  cream with eucerin cream If not improving with plan of care will do referral to dermatology - predniSONE  (DELTASONE ) 20 MG tablet; Take 2 tablets (40 mg total) by mouth daily with breakfast for 5 days. 2 po daily for 5 days  Dispense: 10 tablet; Refill: 0 - triamcinolone  cream (KENALOG ) 0.1 %; Apply 1 Application topically 2 (two) times daily as needed (itching/ rash (max 7 day use)).  Dispense: 453.6 g; Refill: 0    The above assessment and management plan was discussed with the patient. The patient verbalized understanding of and has agreed to the management plan. Patient is aware to call the clinic if symptoms persist or worsen. Patient is aware when to return to the clinic for a follow-up visit. Patient educated on when it is appropriate to go to  the emergency department.   Mary-Margaret Gladis, FNP

## 2024-07-12 NOTE — Patient Instructions (Signed)
 Rash, Adult  A rash is a breakout of spots or blotches on the skin. It can change the way your skin looks and feels. Many things can cause a rash. The goal of treatment is to stop the itching and keep the rash from spreading. Follow these instructions at home: Medicine Take or apply over-the-counter and prescription medicines only as told by your doctor. These may include medicines to treat: Red or swollen skin. Itching. An allergy. Pain. An infection.  Skin care Put a cool, wet cloth on the rash. Do not scratch or rub your skin. Try not to cover the rash. Keep it exposed to air as often as you can. Managing itching and discomfort Avoid hot showers or baths. These can make itching worse. A cold shower may help. Try taking a bath with: Epsom salts. You can get these at your pharmacy or grocery store. Follow the instructions on the package. Baking soda. Pour a small amount into the bath as told by your doctor. Colloidal oatmeal. You can get this at your pharmacy or grocery store. Follow the instructions on the package. Try putting baking soda paste on your skin. Stir water into baking soda until it gets like a paste. Try putting on a lotion to help with itching (calamine lotion). Keep cool. Stay out of the sun. Sweating and being hot can make itching worse. General instructions  Rest as needed. Drink enough fluid to keep your pee (urine) pale yellow. Wear loose-fitting clothes. Avoid scented soaps, detergents, and perfumes. Use gentle soaps, detergents, perfumes, and cosmetics. Avoid the things that cause your rash. Keep a journal to help keep track of what causes your rash. Write down: What you eat. What cosmetics you use. What you drink. What you wear. This includes jewelry. Contact a doctor if: You sweat a lot at night. You pee (urinate) more or less than normal. Your pee is a darker color than normal. Your eyes are sensitive to light. Your skin or the white parts of your  eyes turn yellow. Your skin tingles or is numb. You get painful blisters in your nose or mouth. Your rash does not go away after a few days, or it gets worse. You are more tired than normal. You are more thirsty than normal. You have new or worse symptoms. These may include: Pain in your belly. A fever. Watery poop (diarrhea). Vomiting. Weakness. Weight loss. Get help right away if: You start to feel mixed up (confused). You have a very bad headache or a stiff neck. You have very bad joint pain or stiffness. You get very sleepy or not responsive. You have a seizure. This information is not intended to replace advice given to you by your health care provider. Make sure you discuss any questions you have with your health care provider. Document Revised: 05/17/2022 Document Reviewed: 05/17/2022 Elsevier Patient Education  2024 [employer].

## 2024-07-12 NOTE — Telephone Encounter (Signed)
 FYI Only or Action Required?: FYI only for provider: appointment scheduled on 07/12/24.  Patient was last seen in primary care on 03/03/2024 by [name], DO.  Called Nurse Triage reporting Rash.  Symptoms began several months ago.  Interventions attempted: OTC medications: Benadryl , calamine lotion.  Symptoms are: gradually worsening.  Triage Disposition: See Physician Within 24 Hours  Patient/caregiver understands and will follow disposition?:   Copied from CRM #[id number]. Topic: Clinical - Red Word Triage >> Jul 12, 2024  9:47 AM Susanna ORN wrote: Red Word that prompted transfer to Nurse Triage: Patient wants to see her provider today. States she is broken out all over her body(legs, back, etc) and it's very itchy. States she's been scratching and it's making sores. Reason for Disposition  SEVERE itching (i.e., interferes with sleep, normal activities or school)  Answer Assessment - Initial Assessment Questions Pt reports onset of generalized red bumpy itchy rash everywhere except face since August 2025. Denies SOB, CP, blisters, fever or headache. Denies starting any new medication or introducing any new home or personal products. Of note, pt reports getting over 100 mosquito bites in July. Has not been evaluated yet. Using calamine lotion and benadryl  with limited effect. Appt scheduled at home office today. Advised UC or ED for worsening symptoms.    1. APPEARANCE of RASH: What does the rash look like? (e.g., blisters, dry flaky skin, red spots, redness, sores)     Lots of little red bumps. No blisters or open wounds.  2. SIZE: How big are the spots? (e.g., tip of pen, eraser, coin; inches, centimeters)     A lot of little bumps, a little bigger than the tip of pencil  3. LOCATION: Where is the rash located?     Everywhere expect for face  4. COLOR: What color is the rash? (Note: It is difficult to assess rash color in people with darker-colored skin. When this  situation occurs, simply ask the caller to describe what they see.)     Red  5. ONSET: When did the rash begin?     August 2025  6. FEVER: Do you have a fever? If Yes, ask: What is your temperature, how was it measured, and when did it start?     Denies  7. ITCHING: Does the rash itch? If Yes, ask: How bad is the itch? (Scale 1-10; or mild, moderate, severe)     Can get up to 10/10  8. CAUSE: What do you think is causing the rash?     Unsure  9. MEDICINE FACTORS: Have you started any new medicines within the last 2 weeks? (e.g., antibiotics)      Denies  10. OTHER SYMPTOMS: Do you have any other symptoms? (e.g., dizziness, headache, sore throat, joint pain)       Denies  Protocols used: Rash or Redness - [facility]

## 2024-07-12 NOTE — Telephone Encounter (Signed)
 Apt scheduled.

## 2024-07-22 DIAGNOSIS — R21 Rash and other nonspecific skin eruption: Secondary | ICD-10-CM | POA: Diagnosis not present

## 2024-09-03 ENCOUNTER — Telehealth: Payer: Self-pay | Admitting: Family Medicine

## 2024-09-03 ENCOUNTER — Ambulatory Visit (INDEPENDENT_AMBULATORY_CARE_PROVIDER_SITE_OTHER): Admitting: Family Medicine

## 2024-09-03 ENCOUNTER — Encounter: Payer: Self-pay | Admitting: Family Medicine

## 2024-09-03 VITALS — BP 135/67 | HR 81 | Temp 97.4°F | Ht 63.0 in | Wt 177.0 lb

## 2024-09-03 DIAGNOSIS — N1832 Chronic kidney disease, stage 3b: Secondary | ICD-10-CM

## 2024-09-03 DIAGNOSIS — R21 Rash and other nonspecific skin eruption: Secondary | ICD-10-CM | POA: Diagnosis not present

## 2024-09-03 DIAGNOSIS — I10 Essential (primary) hypertension: Secondary | ICD-10-CM | POA: Diagnosis not present

## 2024-09-03 MED ORDER — CLINDAMYCIN PHOS (TWICE-DAILY) 1 % EX GEL: Freq: Two times a day (BID) | CUTANEOUS | 99 refills | Status: AC

## 2024-09-03 NOTE — Telephone Encounter (Signed)
 Copied from CRM #[id number]. Topic: Clinical - Request for Lab/Test Order >> Sep 03, 2024  8:48 AM Alfonso ORN wrote: Reason for CRM: patient requesting if Norene Fielding can order labs today so patient can come in today Friday 1/23 to do blood work    pt is cancelling her appt with Norene Fielding Monday 1/26 due to weather but want to the blood work in advance

## 2024-09-03 NOTE — Patient Instructions (Signed)
 I can't see the referral details, so you'll have to call back down there to get more info on that appointment in [place].

## 2024-09-03 NOTE — Telephone Encounter (Signed)
 Patient seeing G today will order labs at visit.

## 2024-09-03 NOTE — Progress Notes (Unsigned)
 "  Subjective: CC:*** PCP: Wendy Norene HERO, DO [name] is a 84 y.o. female presenting to clinic today for:  ***   ROS: Per HPI  Allergies[1] Past Medical History:  Diagnosis Date   Allergy    Arthritis    bone spurs - bilateral knee issues    Encounter for screening colonoscopy 02/27/2017   Overview:   [facility]. Dr Debarah. No recall colon due to age and absence of adenomas.     GERD (gastroesophageal reflux disease)    Hyperlipidemia    Hypertension    Current Medications[2] Social History   Socioeconomic History   Marital status: Widowed    Spouse name: Not on file   Number of children: 1   Years of education: Not on file   Highest education level: 11th grade  Occupational History   Occupation: Retired   Tobacco Use   Smoking status: Never   Smokeless tobacco: Never  Vaping Use   Vaping status: Never Used  Substance and Sexual Activity   Alcohol use: No    Alcohol/week: 0.0 standard drinks of alcohol   Drug use: No   Sexual activity: Not Currently  Other Topics Concern   Not on file  Social History Narrative   Live alone; Her son lives nearby   Social Drivers of Health   Tobacco Use: Low Risk (09/03/2024)   Patient History    Smoking Tobacco Use: Never    Smokeless Tobacco Use: Never    Passive Exposure: Not on file  Financial Resource Strain: Low Risk (03/13/2023)   Overall Financial Resource Strain (CARDIA)    Difficulty of Paying Living Expenses: Not hard at all  Food Insecurity: No Food Insecurity (03/13/2023)   Hunger Vital Sign    Worried About Running Out of Food in the Last Year: Never true    Ran Out of Food in the Last Year: Never true  Transportation Needs: No Transportation Needs (03/13/2023)   PRAPARE - Administrator, Civil Service (Medical): No    Lack of Transportation (Non-Medical): No  Physical Activity: Insufficiently Active (03/13/2023)   Exercise Vital Sign    Days of Exercise per Week: 4 days    Minutes of Exercise  per Session: 10 min  Stress: No Stress Concern Present (03/13/2023)   [employer] of Occupational Health - Occupational Stress Questionnaire    Feeling of Stress : Not at all  Social Connections: Moderately Isolated (03/13/2023)   Social Connection and Isolation Panel    Frequency of Communication with Friends and Family: More than three times a week    Frequency of Social Gatherings with Friends and Family: More than three times a week    Attends Religious Services: More than 4 times per year    Active Member of [employer] or Organizations: No    Attends Banker Meetings: Never    Marital Status: Widowed  Intimate Partner Violence: Not At Risk (03/13/2023)   Humiliation, Afraid, Rape, and Kick questionnaire    Fear of Current or Ex-Partner: No    Emotionally Abused: No    Physically Abused: No    Sexually Abused: No  Depression (PHQ2-9): Low Risk (07/12/2024)   Depression (PHQ2-9)    PHQ-2 Score: 0  Alcohol Screen: Low Risk (06/07/2024)   Alcohol Screen    Last Alcohol Screening Score (AUDIT): 0  Housing: Low Risk (03/13/2023)   Housing    Last Housing Risk Score: 0  Utilities: Not At Risk (03/13/2023)   AHC Utilities  Threatened with loss of utilities: No  Health Literacy: Adequate Health Literacy (03/13/2023)   B1300 Health Literacy    Frequency of need for help with medical instructions: Never   Family History  Problem Relation Age of Onset   Diabetes Mother    Heart disease Mother    Cancer Father        unsure = thinks colon    Lung cancer Sister        2025   Arthritis Sister        back issues    Cancer Brother        unsure    Stroke Brother     Objective: Office vital signs reviewed. BP 135/67   Pulse 81   Temp (!) 97.4 F (36.3 C)   Ht 5' 3 (1.6 m)   Wt 177 lb (80.3 kg)   SpO2 97%   BMI 31.35 kg/m   Physical Examination:  General: Awake, alert, *** nourished, No acute distress HEENT: Normal    Neck: No masses palpated. No  lymphadenopathy    Ears: Tympanic membranes intact, normal light reflex, no erythema, no bulging    Eyes: PERRLA, extraocular membranes intact, sclera ***    Nose: nasal turbinates moist, *** nasal discharge    Throat: moist mucus membranes, no erythema, *** tonsillar exudate.  Airway is patent Cardio: regular rate and rhythm, S1S2 heard, no murmurs appreciated Pulm: clear to auscultation bilaterally, no wheezes, rhonchi or rales; normal work of breathing on room air GI: soft, non-tender, non-distended, bowel sounds present x4, no hepatomegaly, no splenomegaly, no masses GU: external vaginal tissue ***, cervix ***, *** punctate lesions on cervix appreciated, *** discharge from cervical os, *** bleeding, *** cervical motion tenderness, *** abdominal/ adnexal masses Extremities: warm, well perfused, No edema, cyanosis or clubbing; +*** pulses bilaterally MSK: *** gait and *** station Skin: dry; intact; no rashes or lesions Neuro: *** Strength and light touch sensation grossly intact, *** DTRs ***/4  Assessment/ Plan: 84 y.o. female   No diagnosis found.   ***   Wendy Fancher CHRISTELLA Fielding, DO [facility] [phone]     [1]  Allergies Allergen Reactions   Pollen Extract   [2]  Current Outpatient Medications:    amLODipine  (NORVASC ) 2.5 MG tablet, TAKE 1 TABLET EVERY DAY (DISCONTINUE 5MG  TABLET), Disp: 90 tablet, Rfl: 2   calcium carbonate (OS-CAL - DOSED IN MG OF ELEMENTAL CALCIUM) 1250 (500 Ca) MG tablet, Take 1 tablet by mouth., Disp: , Rfl:    Cholecalciferol (VITAMIN D3) 400 UNITS CAPS, Take 1 capsule by mouth daily. , Disp: , Rfl:    hydrochlorothiazide  (HYDRODIURIL ) 12.5 MG tablet, Take 1 tablet (12.5 mg total) by mouth daily as needed (swelling or high blood pressure above 140/90)., Disp: 90 tablet, Rfl: 2   losartan  (COZAAR ) 50 MG tablet, Take 1 tablet (50 mg total) by mouth daily., Disp: 90 tablet, Rfl: 2   RABEprazole  (ACIPHEX ) 20 MG tablet, Take 1  tablet (20 mg total) by mouth daily., Disp: 90 tablet, Rfl: 2   simvastatin  (ZOCOR ) 20 MG tablet, Take 1 tablet (20 mg total) by mouth daily., Disp: 90 tablet, Rfl: 2   triamcinolone  cream (KENALOG ) 0.1 %, Apply 1 Application topically 2 (two) times daily as needed (itching/ rash (max 7 day use))., Disp: 453.6 g, Rfl: 0   desloratadine  (CLARINEX ) 5 MG tablet, Take 1 tablet (5 mg total) by mouth every other day. To replace loratidine (Patient not taking: Reported on 09/03/2024), Disp:  45 tablet, Rfl: 4   DULoxetine  (CYMBALTA ) 30 MG capsule, Take 1 capsule (30 mg total) by mouth daily. For muscle pain (Patient not taking: Reported on 09/03/2024), Disp: 90 capsule, Rfl: 2  "

## 2024-09-06 ENCOUNTER — Ambulatory Visit: Payer: Self-pay | Admitting: Family Medicine

## 2024-09-15 ENCOUNTER — Telehealth: Payer: Self-pay | Admitting: Family Medicine

## 2024-09-15 NOTE — Telephone Encounter (Signed)
 No referral to process. Will provider order referral for pt? Copied from CRM #[id number]. Topic: Appointments - Appointment Info/Confirmation >> Sep 15, 2024 10:45 AM Delon DASEN wrote: Patient still waiting to hear back about dermatologist in [facility]- please call [phone]

## 2024-09-15 NOTE — Telephone Encounter (Signed)
 Lmtcb   Per Dr. Talmadge office note on 1/23 she was supposed to see dermatology sometime in February.

## 2024-10-15 ENCOUNTER — Ambulatory Visit: Admitting: Family Medicine

## 2025-03-08 ENCOUNTER — Encounter: Payer: Self-pay | Admitting: Family Medicine
